# Patient Record
Sex: Female | Born: 1995 | Race: White | Hispanic: No | Marital: Single | Smoking: Former smoker
Health system: Southern US, Community
[De-identification: ages and names within clinical notes are randomized; demographics above are authoritative.]

## PROBLEM LIST (undated history)

## (undated) ENCOUNTER — Inpatient Hospital Stay: Payer: Self-pay

## (undated) DIAGNOSIS — R109 Unspecified abdominal pain: Secondary | ICD-10-CM

## (undated) DIAGNOSIS — K8020 Calculus of gallbladder without cholecystitis without obstruction: Secondary | ICD-10-CM

## (undated) DIAGNOSIS — F419 Anxiety disorder, unspecified: Secondary | ICD-10-CM

## (undated) DIAGNOSIS — R634 Abnormal weight loss: Secondary | ICD-10-CM

## (undated) DIAGNOSIS — N390 Urinary tract infection, site not specified: Secondary | ICD-10-CM

## (undated) DIAGNOSIS — M779 Enthesopathy, unspecified: Secondary | ICD-10-CM

## (undated) DIAGNOSIS — J45909 Unspecified asthma, uncomplicated: Secondary | ICD-10-CM

## (undated) DIAGNOSIS — F900 Attention-deficit hyperactivity disorder, predominantly inattentive type: Secondary | ICD-10-CM

## (undated) HISTORY — DX: Unspecified abdominal pain: R10.9

## (undated) HISTORY — DX: Abnormal weight loss: R63.4

## (undated) HISTORY — DX: Attention-deficit hyperactivity disorder, predominantly inattentive type: F90.0

## (undated) HISTORY — PX: ADENOIDECTOMY: SUR15

## (undated) SURGERY — LAPAROSCOPIC CHOLECYSTECTOMY
Anesthesia: General

---
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
## 2015-01-01 NOTE — BH Specialist Note (Signed)
Referring Provider: Victorino Dike, FNP PCP: Tresa Res, MD Session Time:  10:36 - 11:00 (24 min) Type of Service: [place] Interpreter: No.  Interpreter Name & Language: NA   PRESENTING CONCERNS:  Hannah Hancock is a 19 y.o. female brought in by patient. Hannah Hancock was referred to [facility] for mood and forgetfulness.   GOALS ADDRESSED:  Enhance positive coping skills Increase patient's self-awareness, ability to modulate moods and interact with others in a more pro-social manner Develop healthy interpersonal relationships that lead to alleviation and help prevent the relapse of depression symptoms    INTERVENTIONS:  Assessed current condition/needs Built rapport Discussed integrated care Provided psychoeducation Stress managment    ASSESSMENT/OUTCOME:  Hannah Hancock is quiet and sleepy today. It is difficult to assess current needs as she often answered "I don't know." She contradicted herself at times about her needs. She was looking forward to talking to provider about medication options. Encouraged patient to keep trying as she has tried a few other medications without relief (she admits that she does not take them regularly). Today she reports stopping all medication cold-turkey. Cautioned Hannah Hancock about this and tried to give education, she was minimally receptive.   Current stress includes roommate and differences between both women. Assessed communication. Hannah Hancock admits that she does not attempt to communicate since she is worried about roommate's reaction. Challenged this thought with minimal success. Offered additional communication strategies, Hannah Hancock not interested today.  Hannah Hancock stated her history in counseling and unwillingness to start again. Validated her feelings, attempted complex reflections to create ambivalence. Also normalized the experience of change. We practiced deep breathing and Hannah Hancock did repotr liking this and  getting more energy.  Hannah Hancock denied SI today.   PLAN:  Hannah Hancock will continue to follow medical advice and take medications as prescribed. She will consider how she communicated with roommate and consider changes to help get her needs met. Do something you enjoy to celebrate the end of the semester! She's in agreement.  Scheduled next visit: None at this time.  [facility]

## 2015-01-15 ENCOUNTER — Ambulatory Visit (INDEPENDENT_AMBULATORY_CARE_PROVIDER_SITE_OTHER): Payer: BLUE CROSS/BLUE SHIELD | Admitting: Pediatrics

## 2015-01-15 ENCOUNTER — Encounter: Payer: Self-pay | Admitting: Pediatrics

## 2015-01-15 VITALS — BP 118/72 | HR 92 | Ht 63.0 in | Wt 148.6 lb

## 2015-01-15 DIAGNOSIS — F4323 Adjustment disorder with mixed anxiety and depressed mood: Secondary | ICD-10-CM

## 2015-01-15 DIAGNOSIS — G479 Sleep disorder, unspecified: Secondary | ICD-10-CM

## 2015-01-15 MED ORDER — BUPROPION HCL ER (XL) 300 MG PO TB24: 300.0000 mg | ORAL_TABLET | Freq: Every day | ORAL | Status: DC

## 2015-01-15 NOTE — Progress Notes (Signed)
Adolescent Medicine Consultation Follow-Up Visit Hannah Hancock Hannah Hancock  is a 19 y.o. female referred by Hannah Hancock,ROSEMARY, MD here today for follow-up of adjustment disorder, new medication.   Previsit planning completed:  no  Growth Chart Viewed? yes  PCP Confirmed?  yes   History was provided by the patient.  HPI:  She doesn't feel particularly different taking wellbutrin. She has missed a few days but is overall taking it most of the time. Feels like tiredness is the same but is not more sleepy. Insomnia has not worsened. She had not had any headaches or dizziness but has had some stomach pains. They aren't particularly bothering her. She continues to work about 5 days a week. She is living at her boyfriend's apartment which has been better than living at home was.   Had some burning with urination on Monday after sex but this has improved. She took some Azo with good relief. She has had many UTIs in the past but feels like this has resolved on its own.   No LMP recorded. Patient has had an implant.  The following portions of the patient's history were reviewed and updated as appropriate: allergies, current medications, past family history, past medical history, past social history and problem list.  No Known Allergies   Review of Systems  Constitutional: Negative for weight loss and malaise/fatigue.  Eyes: Negative for blurred vision.  Respiratory: Negative for shortness of breath.   Cardiovascular: Negative for chest pain and palpitations.  Gastrointestinal: Negative for nausea, vomiting, abdominal pain and constipation.  Genitourinary: Negative for dysuria.  Musculoskeletal: Negative for myalgias.  Neurological: Negative for dizziness and headaches.  Psychiatric/Behavioral: Positive for depression. Negative for suicidal ideas.     Social History: Sleep: going to bed late. Still with poor sleep hygiene.  Eating Habits: eats one main meal a day with other snacks  Exercise:  None School: [employer] in the fall  Future Plans: RN   Physical Exam:  Filed Vitals:   01/15/15 0833  BP: 118/72  Pulse: 92  Height: 5\' 3"  (1.6 m)  Weight: 148 lb 9.6 oz (67.405 kg)   BP 118/72 mmHg  Pulse 92  Ht 5\' 3"  (1.6 m)  Wt 148 lb 9.6 oz (67.405 kg)  BMI 26.33 kg/m2 Body mass index: body mass index is 26.33 kg/(m^2). Blood pressure percentiles are 80% systolic and 77% diastolic based on 2000 NHANES data. Blood pressure percentile targets: 90: 123/78, 95: 126/82, 99 + 5 mmHg: 139/94.  Physical Exam  Constitutional: She is oriented to person, place, and time. She appears well-developed and well-nourished.  HENT:  Head: Normocephalic.  Neck: No thyromegaly present.  Cardiovascular: Normal rate, regular rhythm, normal heart sounds and intact distal pulses.   Pulmonary/Chest: Effort normal and breath sounds normal.  Abdominal: Soft. Bowel sounds are normal. There is no tenderness.  Musculoskeletal: Normal range of motion.  Neurological: She is alert and oriented to person, place, and time.  Skin: Skin is warm and dry.  Psychiatric:  Flat affect     Assessment/Plan: 1. Adjustment disorder with mixed anxiety and depressed mood Increase Wellbutrin XL to 300 mg as minimal side effects but also very minimal effect in the past 2 weeks. She will continue to work on consistency of taking it. She is still not interested in seeing any type of therapist. Denies thoughts of self harm today. Has tried zoloft, cymbalta and remeron in the past without good effect or too many side effects.   2. Sleep disturbance Continue to work  on sleep hygiene. Many different things tried for sleep but all have left her feeling more tired the next day including remeron, cymbalta and hydroxyzine. Could consider addition of trazodone in the future.    Follow-up:  1 month   Medical decision-making:  > 15 minutes spent, more than 50% of appointment was spent discussing diagnosis and management of  symptoms

## 2015-01-15 NOTE — Patient Instructions (Addendum)
We increased your medication to 300 mg daily. Keep taking it in the morning. Make sure you are remembering to take it every day as you will get the most benefit out of being consistent with it. You may take 2 of the 150 mg pills until you run out and then pick up your new prescription.   Please let us know if you need something before 1 month!

## 2015-02-16 ENCOUNTER — Ambulatory Visit: Payer: BLUE CROSS/BLUE SHIELD | Admitting: Pediatrics

## 2015-02-24 ENCOUNTER — Encounter (INDEPENDENT_AMBULATORY_CARE_PROVIDER_SITE_OTHER): Payer: Self-pay

## 2015-02-24 ENCOUNTER — Encounter: Payer: Self-pay | Admitting: Pediatrics

## 2015-02-24 ENCOUNTER — Ambulatory Visit (INDEPENDENT_AMBULATORY_CARE_PROVIDER_SITE_OTHER): Payer: BLUE CROSS/BLUE SHIELD | Admitting: Pediatrics

## 2015-02-24 VITALS — BP 119/77 | HR 91 | Ht 63.0 in | Wt 149.8 lb

## 2015-02-24 DIAGNOSIS — F4323 Adjustment disorder with mixed anxiety and depressed mood: Secondary | ICD-10-CM

## 2015-02-24 DIAGNOSIS — G479 Sleep disorder, unspecified: Secondary | ICD-10-CM

## 2015-02-24 DIAGNOSIS — Z113 Encounter for screening for infections with a predominantly sexual mode of transmission: Secondary | ICD-10-CM

## 2015-02-24 DIAGNOSIS — M25551 Pain in right hip: Secondary | ICD-10-CM | POA: Diagnosis not present

## 2015-02-24 DIAGNOSIS — Z975 Presence of (intrauterine) contraceptive device: Secondary | ICD-10-CM | POA: Diagnosis not present

## 2015-02-24 DIAGNOSIS — N921 Excessive and frequent menstruation with irregular cycle: Secondary | ICD-10-CM | POA: Diagnosis not present

## 2015-02-24 MED ORDER — SERTRALINE HCL 25 MG PO TABS: 25.0000 mg | ORAL_TABLET | Freq: Every day | ORAL | Status: DC

## 2015-02-24 NOTE — Patient Instructions (Addendum)
Get established with a primary care doctor-- maybe where your mom goes.   Take the anti-inflammatory medication every day for the next 2 weeks and see if it helps with your hip pain. The primary care doctor can better help assess your hip pain and refer your to sports medicine if needed.  Take 1 tablet of Zoloft once a day. Once we get it established we may be able to increase it over time if need be. We will see you again in a month.   We have checked for infections today to make sure they aren't causing your bleeding. If there are no infections there are a few different options we have to help make the bleeding better during the times you are cycling.

## 2015-02-24 NOTE — Progress Notes (Addendum)
Adolescent Medicine Consultation Follow-Up Visit Hannah Hancock  is a 19 y.o. female referred by Clayborne Dana, MD here today for follow-up of adjustment disorder, insomnia, nexplanon.   Previsit planning completed:  Yes  Completed on: 11/10/2014 PHQ-15: 6 (previously 6) GAD-7:6 (previously 9) PHQ-9:11 (previously 6) Reported problems make it somewhat difficult to complete activities of daily functioning.  Growth Chart Viewed? yes  PCP Confirmed?  Does not have PCP   History was provided by the patient.  HPI:  Patient stopped taking medication because she felt like it was keeping her up all night even if she took it early. Didn't feel like it was helping her at all. She gave it a few weeks try. Would like to start back on Zoloft.   Now that she has stopped it she is still not falling asleep till 4 am. She sleeps till noon if she doesn't have anywhere to be. She is used to this late schedule.   She has been having periods the past 2 months. They are heavier than before. Has had some cramping as well. Using 4-5 pads/tampons in a day. Lasting about 1 week.   Staying at home. Friend is still there. They are moving soon so they will each have their own rooms which will be better.   PHQ-SADS Completed on: 02/24/2015  PHQ-15:  8 GAD-7:  7 PHQ-9:  8 Reported problems make it somewhat difficult to complete activities of daily functioning.   Patient's last menstrual period was 02/15/2015.  The following portions of the patient's history were reviewed and updated as appropriate: allergies, current medications, past family history, past medical history, past social history and problem list.  No Known Allergies   Review of Systems  Constitutional: Negative for weight loss and malaise/fatigue.  Eyes: Negative for blurred vision.  Respiratory: Negative for shortness of breath.   Cardiovascular: Negative for chest pain and palpitations.  Gastrointestinal: Negative for nausea,  vomiting, abdominal pain and constipation.  Genitourinary: Negative for dysuria.  Musculoskeletal: Positive for joint pain. Negative for myalgias.  Neurological: Negative for dizziness and headaches.  Psychiatric/Behavioral: Negative for depression.     Social History: Sleep: As above  Eating Habits: Eating fairly well Exercise: None School: Starts in August    Physical Exam:  Filed Vitals:   02/24/15 1124  BP: 119/77  Pulse: 91  Height:  (1.6 m)  Weight: 149 lb 12.8 oz (67.949 kg)   BP 119/77 mmHg  Pulse 91  Ht  (1.6 m)  Wt 149 lb 12.8 oz (67.949 kg)  BMI 26.54 kg/m2  LMP 02/15/2015 Body mass index: body mass index is 26.54 kg/(m^2). Blood pressure percentiles are 83% systolic and 89% diastolic based on 2000 NHANES data. Blood pressure percentile targets: 90: 122/78, 95: 126/82, 99 + 5 mmHg: 138/94.  Physical Exam  Constitutional: She is oriented to person, place, and time. She appears well-developed and well-nourished.  HENT:  Head: Normocephalic.  Neck: No thyromegaly present.  Cardiovascular: Normal rate, regular rhythm, normal heart sounds and intact distal pulses.   Pulmonary/Chest: Effort normal and breath sounds normal.  Abdominal: Soft. Bowel sounds are normal. There is no tenderness.  Musculoskeletal: Normal range of motion.  Neurological: She is alert and oriented to person, place, and time.  Skin: Skin is warm and dry.  Psychiatric: She has a normal mood and affect.    Assessment/Plan: 1. Adjustment disorder with mixed anxiety and depressed mood Patient would like to go back to Zoloft and start slow and go  up. She felt like this was the most effective. Will increase in the future if need be.  - sertraline (ZOLOFT) 25 MG tablet; Take 1 tablet (25 mg total) by mouth daily.  Dispense: 30 tablet; Refill: 0  2. Sleep disturbance Continue to work on sleep hygiene.   3. Presence of subdermal contraceptive device In place in LUE. Almost 1 year  since placement.   4. Routine screening for STI (sexually transmitted infection) Will screen for STI in setting of new bleeding with nexplanon.  - GC/chlamydia probe amp, urine  5. Breakthrough bleeding on Nexplanon Screen for all infections in the setting of new bleeding with her nexplanon. If not infectious can consider addition of NSAID or OCP to help with heavy bleeding during the cycles she is having.  - WET PREP BY MOLECULAR PROBE  6. Hip pain, right Continue voltaren EC tablet. Establish with PCP for further workup and possible sports med referral.    Follow-up:  1 month   Medical decision-making:  > 25 minutes spent, more than 50% of appointment was spent discussing diagnosis and management of symptoms

## 2015-02-25 ENCOUNTER — Telehealth: Payer: Self-pay | Admitting: *Deleted

## 2015-02-25 LAB — GC/CHLAMYDIA PROBE AMP, URINE
Chlamydia, Swab/Urine, PCR: NEGATIVE
GC Probe Amp, Urine: NEGATIVE

## 2015-02-25 LAB — WET PREP BY MOLECULAR PROBE
Candida species: NEGATIVE
Gardnerella vaginalis: NEGATIVE
TRICHOMONAS VAG: NEGATIVE

## 2015-02-25 NOTE — Telephone Encounter (Signed)
TC to pt. No infections. Advised we will continue to follow her bleeding for her nexplanon. If she has concerns about it before her next appointment she can call clinic. Reminded of f/u appt.

## 2015-02-25 NOTE — Telephone Encounter (Signed)
-----   Message from Verneda Skillaroline T Hacker, FNP sent at 02/25/2015 10:40 AM EDT ----- No infections. We will continue to follow her bleeding for her nexplanon. If she has concerns about it before her next appointment she can call clinic. It is normal to have monthly periods, however, if she is concerned about how heavy it is we can talk about further treatment.

## 2015-03-24 ENCOUNTER — Emergency Department: Payer: BLUE CROSS/BLUE SHIELD

## 2015-03-24 ENCOUNTER — Encounter: Payer: Self-pay | Admitting: Emergency Medicine

## 2015-03-24 ENCOUNTER — Emergency Department
Admission: EM | Admit: 2015-03-24 | Discharge: 2015-03-24 | Disposition: A | Discharge status: Home / Self Care | Payer: BLUE CROSS/BLUE SHIELD | Attending: Emergency Medicine | Admitting: Emergency Medicine

## 2015-03-24 DIAGNOSIS — N390 Urinary tract infection, site not specified: Secondary | ICD-10-CM

## 2015-03-24 DIAGNOSIS — Z720 Tobacco use: Secondary | ICD-10-CM | POA: Diagnosis not present

## 2015-03-24 DIAGNOSIS — Z792 Long term (current) use of antibiotics: Secondary | ICD-10-CM | POA: Diagnosis not present

## 2015-03-24 DIAGNOSIS — Z3202 Encounter for pregnancy test, result negative: Secondary | ICD-10-CM | POA: Diagnosis not present

## 2015-03-24 DIAGNOSIS — R112 Nausea with vomiting, unspecified: Secondary | ICD-10-CM | POA: Diagnosis present

## 2015-03-24 DIAGNOSIS — R52 Pain, unspecified: Secondary | ICD-10-CM

## 2015-03-24 DIAGNOSIS — Z791 Long term (current) use of non-steroidal anti-inflammatories (NSAID): Secondary | ICD-10-CM | POA: Diagnosis not present

## 2015-03-24 DIAGNOSIS — R748 Abnormal levels of other serum enzymes: Secondary | ICD-10-CM | POA: Diagnosis not present

## 2015-03-24 DIAGNOSIS — R110 Nausea: Secondary | ICD-10-CM

## 2015-03-24 HISTORY — DX: Enthesopathy, unspecified: M77.9

## 2015-03-24 LAB — COMPREHENSIVE METABOLIC PANEL
ALK PHOS: 238 U/L — AB (ref 38–126)
ALT: 160 U/L — ABNORMAL HIGH (ref 14–54)
ANION GAP: 11 (ref 5–15)
AST: 139 U/L — AB (ref 15–41)
Albumin: 4 g/dL (ref 3.5–5.0)
BILIRUBIN TOTAL: 10.1 mg/dL — AB (ref 0.3–1.2)
BUN: 11 mg/dL (ref 6–20)
CO2: 23 mmol/L (ref 22–32)
Calcium: 9.3 mg/dL (ref 8.9–10.3)
Chloride: 103 mmol/L (ref 101–111)
Creatinine, Ser: 0.32 mg/dL — ABNORMAL LOW (ref 0.44–1.00)
GFR calc Af Amer: >60 mL/min (ref >60)
GLUCOSE: 92 mg/dL (ref 65–99)
Potassium: 3.6 mmol/L (ref 3.5–5.1)
SODIUM: 137 mmol/L (ref 135–145)
TOTAL PROTEIN: 7.6 g/dL (ref 6.5–8.1)

## 2015-03-24 LAB — CBC
HEMATOCRIT: 35.9 % (ref 35.0–47.0)
Hemoglobin: 12.5 g/dL (ref 12.0–16.0)
MCH: 29.6 pg (ref 26.0–34.0)
MCHC: 34.8 g/dL (ref 32.0–36.0)
MCV: 85.1 fL (ref 80.0–100.0)
PLATELETS: 126 10*3/uL — AB (ref 150–440)
RBC: 4.22 MIL/uL (ref 3.80–5.20)
RDW: 13.6 % (ref 11.5–14.5)
WBC: 6.5 10*3/uL (ref 3.6–11.0)

## 2015-03-24 LAB — URINALYSIS COMPLETE WITH MICROSCOPIC (ARMC ONLY)
GLUCOSE, UA: NEGATIVE mg/dL
NITRITE: NEGATIVE
Protein, ur: 30 mg/dL — AB
Specific Gravity, Urine: 1.027 (ref 1.005–1.030)
pH: 5 (ref 5.0–8.0)

## 2015-03-24 LAB — PREGNANCY, URINE: PREG TEST UR: NEGATIVE

## 2015-03-24 LAB — LIPASE, BLOOD: Lipase: 36 U/L (ref 22–51)

## 2015-03-24 MED ORDER — CEPHALEXIN 500 MG PO CAPS: 500.0000 mg | ORAL_CAPSULE | Freq: Four times a day (QID) | ORAL | Status: DC

## 2015-03-24 MED ORDER — DEXTROSE 5 % IV SOLN
1.0000 g | Freq: Once | INTRAVENOUS | Status: AC
  Administered 2015-03-24: 1 g via INTRAVENOUS
  Filled 2015-03-24: qty 10

## 2015-03-24 MED ORDER — SODIUM CHLORIDE 0.9 % IV BOLUS (SEPSIS)
1000.0000 mL | Freq: Once | INTRAVENOUS | Status: AC
  Administered 2015-03-24: 1000 mL via INTRAVENOUS

## 2015-03-24 MED ORDER — ONDANSETRON HCL 4 MG PO TABS: 4.0000 mg | ORAL_TABLET | Freq: Every day | ORAL | Status: AC | PRN

## 2015-03-24 NOTE — ED Notes (Signed)
To [place] via wheelchair.  AAOx3.  Skin warm and dry.

## 2015-03-24 NOTE — ED Notes (Signed)
Return from [place].  AAOx3.  Skin warm and dry. NAD 

## 2015-03-24 NOTE — ED Notes (Signed)
Pt reports nausea starting about a week ago with vomiting yesterday.  Pt also reports hematuria starting 2 days, but denies pain on urination.  Pt reports feeling like she was going to pass out yesterday.

## 2015-03-24 NOTE — Discharge Instructions (Signed)
° ° ° °  CALL DR. Bluford Kaufmann FOR AN APPOINTMENT TO DISCUSS AND DO MORE TESTS CONCERNING YOUR ELEVATED LIVER STUDIES FROM TODAY.  MORE TESTS WILL NEED TO BE DONE INCREASE FLUIDS TAKE KEFLEX FOR INFECTION AND ZOFRAN AS NEEDED FOR NAUSEA OR VOMITING DO NOT TAKE ANY PRODUCTS WITH TYLENOL IN IT UNTIL SEEN BY DR. Bluford Kaufmann

## 2015-03-24 NOTE — ED Provider Notes (Signed)
[facility] Emergency Department Provider Note  ____________________________________________  Time seen:  2:13 PM  I have reviewed the triage vital signs and the nursing notes.   HISTORY  Chief Complaint Hematuria and Nausea   HPI Hannah Hancock is a 19 y.o. female is here today with complaint of nausea for approximately one week. She states that she has some vomiting yesterday and was able to take some medication was given to her during the when her months when she was sick. This helped with the nausea great deal. She is also continued to feel weak at work and had a near syncopal episode yesterday. She complains of some slight abdominal cramping. She reports hematuria starting 2 days ago but denies dysuria. She is unaware of any fever. There has been some nausea after eating especially places like cookout. Patient initially was over at [facility] clinic and was brought over for evaluation.No other family members are sick at this time. Patient also states that she has not felt like eating for the last 2 days but was able to eat some eggs today.   Past Medical History  Diagnosis Date  . Abdominal pain, recurrent   . Weight loss   . Urinary tract infection     Last one 4 months ago  . Varicella   . 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 Tendinitis     R hip    Patient Active Problem List   Diagnosis Date Noted  . 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 Presence of subdermal contraceptive device 04/07/2014  . Sleep disturbance 11/03/2013  . Adjustment disorder with mixed anxiety and depressed mood 08/12/2013    Past Surgical History  Procedure Laterality Date  . Adenoidectomy    . Esophagogastroduodenoscopy  10/20/2011    Procedure: ESOPHAGOGASTRODUODENOSCOPY (EGD);  Surgeon: Jon Gills, MD;  Location: [facility] OR;  Service: Gastroenterology;  Laterality: N/A;    Current Outpatient Rx  Name  Route  Sig  Dispense  Refill  .  cephALEXin (KEFLEX) 500 MG capsule   Oral   Take 1 capsule (500 mg total) by mouth 4 (four) times daily.   40 capsule   0   . clindamycin-benzoyl peroxide (BENZACLIN) gel               . diclofenac (VOLTAREN) 50 MG EC tablet   Oral   Take 1 tablet (50 mg total) by mouth 2 (two) times daily.   60 tablet   3   . EPIDUO 0.1-2.5 % gel               . Multiple Vitamin (MULTIVITAMIN) tablet   Oral   Take 1 tablet by mouth daily.         . ondansetron (ZOFRAN) 4 MG tablet   Oral   Take 1 tablet (4 mg total) by mouth daily as needed for nausea or vomiting.   30 tablet   1   . sertraline (ZOLOFT) 25 MG tablet   Oral   Take 1 tablet (25 mg total) by mouth daily.   30 tablet   0     Allergies Review of patient's allergies indicates no known allergies.  Family History  Problem Relation Age of Onset  . Cholelithiasis Mother   . Miscarriages / [place] Mother   . Hypertension Maternal Grandmother   . Kidney disease Maternal Grandmother   . Stroke Maternal Grandmother   . Diabetes Paternal Grandfather   . Hypertension Paternal Grandfather  Social History History  Substance Use Topics  . Smoking status: Current Every Day Smoker -- 0.30 packs/day for 1 years    Types: Cigarettes  . Smokeless tobacco: Never Used  . Alcohol Use: No    Review of Systems Constitutional: No fever/chills Eyes: No visual changes. ENT: No sore throat. Cardiovascular: Denies chest pain. Respiratory: Denies shortness of breath. Gastrointestinal: No abdominal pain.  No nausea, no vomiting.  No diarrhea. Genitourinary: Positive hematuria Musculoskeletal: Negative for back pain. Skin: Negative for rash. Neurological: Negative for headaches, focal weakness or numbness.  10-point ROS otherwise negative.  ____________________________________________   PHYSICAL EXAM:  VITAL SIGNS: ED Triage Vitals  Enc Vitals Group     BP 03/24/15 1246 114/69 mmHg     Pulse Rate 03/24/15  1246 98     Resp 03/24/15 1246 20     Temp 03/24/15 1246 98.6 F (37 C)     Temp Source 03/24/15 1246 Oral     SpO2 03/24/15 1246 98 %     Weight 03/24/15 1246 140 lb (63.504 kg)     Height 03/24/15 1246  (1.6 m)     Head Cir --      Peak Flow --      Pain Score --      Pain Loc --      Pain Edu? --      Excl. in GC? --     Constitutional: Alert and oriented. Well appearing and in no acute distress. Eyes: Conjunctivae are normal to slightly questionable icterus. PERRL. EOMI. Head: Atraumatic. Nose: No congestion/rhinnorhea. Mouth/Throat: Mucous membranes are moist.  Oropharynx non-erythematous. Neck: No stridor.   Hematological/Lymphatic/Immunilogical: No cervical lymphadenopathy. Cardiovascular: Normal rate, regular rhythm. Grossly normal heart sounds.  Good peripheral circulation. Respiratory: Normal respiratory effort.  No retractions. Lungs CTAB. Gastrointestinal: Soft. Mild suprapubic tenderness. No distention. No abdominal bruits. No CVA tenderness. Musculoskeletal: No lower extremity tenderness nor edema.  No joint effusions. Neurologic:  Normal speech and language. No gross focal neurologic deficits are appreciated. No gait instability. Skin:  Skin is warm, dry and intact. No rash noted. Psychiatric: Mood and affect are normal. Speech and behavior are normal.  ____________________________________________   LABS (all labs ordered are listed, but only abnormal results are displayed)  Labs Reviewed  COMPREHENSIVE METABOLIC PANEL - Abnormal; Notable for the following:    Creatinine, Ser 0.32 (*)    AST 139 (*)    ALT 160 (*)    Alkaline Phosphatase 238 (*)    Total Bilirubin 10.1 (*)    All other components within normal limits  CBC - Abnormal; Notable for the following:    Platelets 126 (*)    All other components within normal limits  URINALYSIS COMPLETEWITH MICROSCOPIC ([facility] ONLY) - Abnormal; Notable for the following:    Color, Urine AMBER (*)     APPearance CLEAR (*)    Bilirubin Urine 2+ (*)    Ketones, ur 1+ (*)    Hgb urine dipstick 3+ (*)    Protein, ur 30 (*)    Leukocytes, UA 1+ (*)    Bacteria, UA FEW (*)    Squamous Epithelial / LPF 6-30 (*)    All other components within normal limits  LIPASE, BLOOD  PREGNANCY, URINE  HEPATITIS PANEL, ACUTE  POC URINE PREG, ED   RADIOLOGY   Ultrasound abdomen limited right upper quadrant shows normal gallbladder thickness per radiologist normal bile duct and normal appearance of the liver. ____________________________________________   PROCEDURES  Procedure(s) performed: None  Critical Care performed: No  ____________________________________________   INITIAL IMPRESSION / ASSESSMENT AND PLAN / ED COURSE  Pertinent labs & imaging results that were available during my care of the patient were reviewed by me and considered in my medical decision making (see chart for details).  Discussed with patient and family member elevated liver enzymes. Patient states that she does not drink alcohol, does not smoke, does not do any IV drugs or drug use of any kind. Patient and family member is aware that she needs to follow-up with Dr. Bluford Kaufmann.  Hepatitis panel was sent and is still pending. Patient was placed on Keflex for urinary tract infection. She is also given a prescription for Zofran if needed for nausea. She is return to the emergency room if any severe worsening urgent concerns. ____________________________________________   FINAL CLINICAL IMPRESSION(S) / ED DIAGNOSES  Final diagnoses:  Nausea  Pain  Acute urinary tract infection  Elevated liver enzymes      Tommi Rumps, PA-C 03/24/15 1726  Arnaldo Natal, MD 03/25/15 1221

## 2015-03-24 NOTE — ED Notes (Signed)
Pt brought over by [facility] for N/V, reports "almost passed out at work yesterday".

## 2015-03-24 NOTE — ED Notes (Signed)
States she noticed some blood in her urine yesterday and today . Also felt weak at work and had near syncopal episode. conts to feel same with slight abd cramping

## 2015-03-25 LAB — HEPATITIS PANEL, ACUTE
Hep A IgM: NEGATIVE
Hep B C IgM: NEGATIVE
Hepatitis B Surface Ag: NEGATIVE

## 2015-03-26 ENCOUNTER — Other Ambulatory Visit: Payer: Self-pay | Admitting: Physician Assistant

## 2015-03-26 DIAGNOSIS — R7989 Other specified abnormal findings of blood chemistry: Secondary | ICD-10-CM

## 2015-03-26 DIAGNOSIS — R945 Abnormal results of liver function studies: Secondary | ICD-10-CM

## 2015-03-26 DIAGNOSIS — R112 Nausea with vomiting, unspecified: Secondary | ICD-10-CM

## 2015-03-26 DIAGNOSIS — R17 Unspecified jaundice: Secondary | ICD-10-CM

## 2015-03-29 ENCOUNTER — Ambulatory Visit: Payer: BLUE CROSS/BLUE SHIELD | Admitting: Pediatrics

## 2015-03-30 ENCOUNTER — Ambulatory Visit
Admission: RE | Admit: 2015-03-30 | Discharge: 2015-03-30 | Disposition: A | Discharge status: Home / Self Care | Payer: BLUE CROSS/BLUE SHIELD | Source: Ambulatory Visit | Attending: Physician Assistant | Admitting: Physician Assistant

## 2015-03-30 DIAGNOSIS — R945 Abnormal results of liver function studies: Secondary | ICD-10-CM

## 2015-03-30 DIAGNOSIS — R112 Nausea with vomiting, unspecified: Secondary | ICD-10-CM | POA: Diagnosis present

## 2015-03-30 DIAGNOSIS — R7989 Other specified abnormal findings of blood chemistry: Secondary | ICD-10-CM | POA: Insufficient documentation

## 2015-03-30 DIAGNOSIS — R17 Unspecified jaundice: Secondary | ICD-10-CM | POA: Diagnosis present

## 2015-03-30 HISTORY — DX: Unspecified asthma, uncomplicated: J45.909

## 2015-03-30 MED ORDER — IOHEXOL 300 MG/ML  SOLN
100.0000 mL | Freq: Once | INTRAMUSCULAR | Status: AC | PRN
  Administered 2015-03-30: 100 mL via INTRAVENOUS

## 2015-03-31 ENCOUNTER — Ambulatory Visit: Payer: BLUE CROSS/BLUE SHIELD

## 2015-05-03 DIAGNOSIS — F32A Depression, unspecified: Secondary | ICD-10-CM | POA: Insufficient documentation

## 2015-08-10 ENCOUNTER — Other Ambulatory Visit: Payer: Self-pay | Admitting: Internal Medicine

## 2015-08-10 DIAGNOSIS — M25551 Pain in right hip: Principal | ICD-10-CM

## 2015-08-10 DIAGNOSIS — G8929 Other chronic pain: Secondary | ICD-10-CM

## 2015-08-31 ENCOUNTER — Ambulatory Visit
Admission: RE | Admit: 2015-08-31 | Discharge: 2015-08-31 | Disposition: A | Discharge status: Home / Self Care | Payer: BLUE CROSS/BLUE SHIELD | Source: Ambulatory Visit | Attending: Internal Medicine | Admitting: Internal Medicine

## 2015-08-31 DIAGNOSIS — M25551 Pain in right hip: Secondary | ICD-10-CM | POA: Insufficient documentation

## 2015-08-31 DIAGNOSIS — G8929 Other chronic pain: Secondary | ICD-10-CM | POA: Diagnosis present

## 2016-03-09 DIAGNOSIS — E559 Vitamin D deficiency, unspecified: Secondary | ICD-10-CM | POA: Insufficient documentation

## 2016-03-15 ENCOUNTER — Encounter: Payer: Self-pay | Admitting: Pediatrics

## 2016-03-16 ENCOUNTER — Encounter: Payer: Self-pay | Admitting: Pediatrics

## 2016-12-24 ENCOUNTER — Encounter: Payer: Self-pay | Admitting: Emergency Medicine

## 2016-12-24 ENCOUNTER — Emergency Department
Admission: EM | Admit: 2016-12-24 | Discharge: 2016-12-24 | Disposition: A | Discharge status: Home / Self Care | Payer: BLUE CROSS/BLUE SHIELD | Attending: Emergency Medicine | Admitting: Emergency Medicine

## 2016-12-24 DIAGNOSIS — M791 Myalgia: Secondary | ICD-10-CM | POA: Diagnosis not present

## 2016-12-24 DIAGNOSIS — Y999 Unspecified external cause status: Secondary | ICD-10-CM | POA: Insufficient documentation

## 2016-12-24 DIAGNOSIS — F17210 Nicotine dependence, cigarettes, uncomplicated: Secondary | ICD-10-CM | POA: Insufficient documentation

## 2016-12-24 DIAGNOSIS — Y939 Activity, unspecified: Secondary | ICD-10-CM | POA: Insufficient documentation

## 2016-12-24 DIAGNOSIS — M7918 Myalgia, other site: Secondary | ICD-10-CM

## 2016-12-24 DIAGNOSIS — S0990XA Unspecified injury of head, initial encounter: Secondary | ICD-10-CM | POA: Diagnosis present

## 2016-12-24 DIAGNOSIS — F900 Attention-deficit hyperactivity disorder, predominantly inattentive type: Secondary | ICD-10-CM | POA: Insufficient documentation

## 2016-12-24 DIAGNOSIS — Y92410 Unspecified street and highway as the place of occurrence of the external cause: Secondary | ICD-10-CM | POA: Diagnosis not present

## 2016-12-24 DIAGNOSIS — J45909 Unspecified asthma, uncomplicated: Secondary | ICD-10-CM | POA: Diagnosis not present

## 2016-12-24 MED ORDER — CYCLOBENZAPRINE HCL 10 MG PO TABS: 10.0000 mg | ORAL_TABLET | Freq: Three times a day (TID) | ORAL | 0 refills | Status: DC | PRN
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
## 2017-02-05 ENCOUNTER — Telehealth: Payer: Self-pay | Admitting: Obstetrics and Gynecology

## 2017-02-05 NOTE — Telephone Encounter (Signed)
Patient called to make a New patient appointment 02/05/2017. The computer system was down and I was not able to pull up her information, I informed the patient that I would call back when Epic came back up. I was not able to lvm when I called. Thank you.

## 2017-02-13 ENCOUNTER — Telehealth: Payer: Self-pay | Admitting: Obstetrics and Gynecology

## 2017-02-13 NOTE — Telephone Encounter (Signed)
Patient called wanting to speak with a nurse or get something to help, Patient is Vomiting and having severe Nausea, and would like to be contacting to get something to help. Please advise.

## 2017-02-14 ENCOUNTER — Encounter: Payer: Self-pay | Admitting: *Deleted

## 2017-02-14 ENCOUNTER — Emergency Department
Admission: EM | Admit: 2017-02-14 | Discharge: 2017-02-14 | Disposition: A | Discharge status: Home / Self Care | Payer: BLUE CROSS/BLUE SHIELD | Attending: Emergency Medicine | Admitting: Emergency Medicine

## 2017-02-14 DIAGNOSIS — O219 Vomiting of pregnancy, unspecified: Secondary | ICD-10-CM | POA: Diagnosis present

## 2017-02-14 DIAGNOSIS — F17210 Nicotine dependence, cigarettes, uncomplicated: Secondary | ICD-10-CM | POA: Insufficient documentation

## 2017-02-14 DIAGNOSIS — F900 Attention-deficit hyperactivity disorder, predominantly inattentive type: Secondary | ICD-10-CM | POA: Diagnosis not present

## 2017-02-14 DIAGNOSIS — J45909 Unspecified asthma, uncomplicated: Secondary | ICD-10-CM | POA: Diagnosis not present

## 2017-02-14 DIAGNOSIS — Z3A01 Less than 8 weeks gestation of pregnancy: Secondary | ICD-10-CM | POA: Insufficient documentation

## 2017-02-14 DIAGNOSIS — Z79899 Other long term (current) drug therapy: Secondary | ICD-10-CM | POA: Diagnosis not present

## 2017-02-14 DIAGNOSIS — O210 Mild hyperemesis gravidarum: Secondary | ICD-10-CM

## 2017-02-14 LAB — URINALYSIS, COMPLETE (UACMP) WITH MICROSCOPIC
Bacteria, UA: NONE SEEN
Bilirubin Urine: NEGATIVE
Glucose, UA: NEGATIVE mg/dL
Ketones, ur: 5 mg/dL — AB
Nitrite: NEGATIVE
PH: 6 (ref 5.0–8.0)
Protein, ur: NEGATIVE mg/dL
SPECIFIC GRAVITY, URINE: 1.013 (ref 1.005–1.030)

## 2017-02-14 LAB — COMPREHENSIVE METABOLIC PANEL
ALK PHOS: 66 U/L (ref 38–126)
ALT: 13 U/L — AB (ref 14–54)
AST: 16 U/L (ref 15–41)
Albumin: 4.8 g/dL (ref 3.5–5.0)
Anion gap: 10 (ref 5–15)
BUN: 11 mg/dL (ref 6–20)
CO2: 21 mmol/L — ABNORMAL LOW (ref 22–32)
CREATININE: 0.53 mg/dL (ref 0.44–1.00)
Calcium: 9.7 mg/dL (ref 8.9–10.3)
Chloride: 102 mmol/L (ref 101–111)
GFR calc non Af Amer: >60 mL/min (ref >60)
Glucose, Bld: 86 mg/dL (ref 65–99)
Potassium: 3.4 mmol/L — ABNORMAL LOW (ref 3.5–5.1)
SODIUM: 133 mmol/L — AB (ref 135–145)
Total Bilirubin: 0.8 mg/dL (ref 0.3–1.2)
Total Protein: 8.4 g/dL — ABNORMAL HIGH (ref 6.5–8.1)

## 2017-02-14 LAB — BETA-HYDROXYBUTYRIC ACID: Beta-Hydroxybutyric Acid: 0.44 mmol/L — ABNORMAL HIGH (ref 0.05–0.27)

## 2017-02-14 LAB — CBC
HCT: 38.6 % (ref 35.0–47.0)
Hemoglobin: 13.5 g/dL (ref 12.0–16.0)
MCH: 28.9 pg (ref 26.0–34.0)
MCHC: 34.9 g/dL (ref 32.0–36.0)
MCV: 82.7 fL (ref 80.0–100.0)
PLATELETS: 280 10*3/uL (ref 150–440)
RBC: 4.67 MIL/uL (ref 3.80–5.20)
RDW: 12.6 % (ref 11.5–14.5)
WBC: 11.7 10*3/uL — ABNORMAL HIGH (ref 3.6–11.0)

## 2017-02-14 LAB — POCT PREGNANCY, URINE: Preg Test, Ur: POSITIVE — AB

## 2017-02-14 MED ORDER — DEXTROSE-NACL 5-0.9 % IV SOLN
INTRAVENOUS | Status: DC
  Administered 2017-02-14: 21:00:00 via INTRAVENOUS
  Filled 2017-02-14: qty 1000

## 2017-02-14 MED ORDER — SODIUM CHLORIDE 0.9 % IV BOLUS (SEPSIS): 1000.0000 mL | Freq: Once | INTRAVENOUS | Status: DC

## 2017-02-14 MED ORDER — DEXTROSE 50 % IV SOLN: 2.0000 | Freq: Once | INTRAVENOUS | Status: DC

## 2017-02-14 MED ORDER — METOCLOPRAMIDE HCL 10 MG PO TABS: 10.0000 mg | ORAL_TABLET | Freq: Three times a day (TID) | ORAL | 0 refills | Status: DC

## 2017-02-14 MED ORDER — METOCLOPRAMIDE HCL 5 MG/ML IJ SOLN
10.0000 mg | Freq: Once | INTRAMUSCULAR | Status: AC
  Administered 2017-02-14: 10 mg via INTRAVENOUS

## 2017-02-14 MED ORDER — METOCLOPRAMIDE HCL 5 MG/ML IJ SOLN
INTRAMUSCULAR | Status: AC
  Administered 2017-02-14: 10 mg via INTRAVENOUS
  Filled 2017-02-14: qty 2

## 2017-02-14 NOTE — Telephone Encounter (Signed)
Patient called again about getting a medication for the morning sickness - she is sick all day long and can't keep anything down Please [facility] [address]

## 2017-02-14 NOTE — ED Notes (Signed)
Pt discharged to home.  Family member driving.  Discharge instructions reviewed.  Verbalized understanding.  No questions or concerns at this time.  Teach back verified.  Pt in NAD.  No items left in ED.   

## 2017-02-14 NOTE — Discharge Instructions (Signed)
Please use Reglan 3 times a day as needed for nausea and vomiting. You can also purchase over-the-counter vitamin B6 and take this 3 times a day as needed for nausea as well as over-the-counter Unisom to take nightly.  Please establish care with an OB gynecologist for this pregnancy. I've referred you to Dr. Jean RosenthalJackson who is on call tonight.  Return to the emergency department for any concerns.  It was a pleasure to take care of you today, and thank you for coming to our emergency department.  If you have any questions or concerns before leaving please ask the nurse to grab me and I'm more than happy to go through your aftercare instructions again.  If you were prescribed any opioid pain medication today such as Norco, Vicodin, Percocet, morphine, hydrocodone, or oxycodone please make sure you do not drive when you are taking this medication as it can alter your ability to drive safely.  If you have any concerns once you are home that you are not improving or are in fact getting worse before you can make it to your follow-up appointment, please do not hesitate to call 911 and come back for further evaluation.  Merrily BrittleNeil Daveena Elmore MD  Results for orders placed or performed during the hospital encounter of 02/14/17  Comprehensive metabolic panel  Result Value Ref Range   Sodium 133 (L) 135 - 145 mmol/L   Potassium 3.4 (L) 3.5 - 5.1 mmol/L   Chloride 102 101 - 111 mmol/L   CO2 21 (L) 22 - 32 mmol/L   Glucose, Bld 86 65 - 99 mg/dL   BUN 11 6 - 20 mg/dL   Creatinine, Ser 1.610.53 0.44 - 1.00 mg/dL   Calcium 9.7 8.9 - 09.610.3 mg/dL   Total Protein 8.4 (H) 6.5 - 8.1 g/dL   Albumin 4.8 3.5 - 5.0 g/dL   AST 16 15 - 41 U/L   ALT 13 (L) 14 - 54 U/L   Alkaline Phosphatase 66 38 - 126 U/L   Total Bilirubin 0.8 0.3 - 1.2 mg/dL   GFR calc non Af Amer >60 >60 mL/min   GFR calc Af Amer >60 >60 mL/min   Anion gap 10 5 - 15  CBC  Result Value Ref Range   WBC 11.7 (H) 3.6 - 11.0 K/uL   RBC 4.67 3.80 - 5.20 MIL/uL    Hemoglobin 13.5 12.0 - 16.0 g/dL   HCT 04.538.6 40.935.0 - 81.147.0 %   MCV 82.7 80.0 - 100.0 fL   MCH 28.9 26.0 - 34.0 pg   MCHC 34.9 32.0 - 36.0 g/dL   RDW 91.412.6 78.211.5 - 95.614.5 %   Platelets 280 150 - 440 K/uL  Urinalysis, Complete w Microscopic  Result Value Ref Range   Color, Urine YELLOW (A) YELLOW   APPearance HAZY (A) CLEAR   Specific Gravity, Urine 1.013 1.005 - 1.030   pH 6.0 5.0 - 8.0   Glucose, UA NEGATIVE NEGATIVE mg/dL   Hgb urine dipstick SMALL (A) NEGATIVE   Bilirubin Urine NEGATIVE NEGATIVE   Ketones, ur 5 (A) NEGATIVE mg/dL   Protein, ur NEGATIVE NEGATIVE mg/dL   Nitrite NEGATIVE NEGATIVE   Leukocytes, UA MODERATE (A) NEGATIVE   RBC / HPF 0-5 0 - 5 RBC/hpf   WBC, UA 6-30 0 - 5 WBC/hpf   Bacteria, UA NONE SEEN NONE SEEN   Squamous Epithelial / LPF 0-5 (A) NONE SEEN   Mucous PRESENT   Beta-hydroxybutyric acid  Result Value Ref Range   Beta-Hydroxybutyric Acid  0.44 (H) 0.05 - 0.27 mmol/L  Pregnancy, urine POC  Result Value Ref Range   Preg Test, Ur POSITIVE (A) NEGATIVE

## 2017-02-14 NOTE — ED Provider Notes (Signed)
[facility] Emergency Department Provider Note  ____________________________________________   First MD Initiated Contact with Patient 02/14/17 1941     (approximate)  I have reviewed the triage vital signs and the nursing notes.   HISTORY  Chief Complaint Nausea and Emesis    HPI Hannah Hancock is a 21 y.o. female who comes to the emergency Department with roughly 2 days of nausea vomiting and difficulty eating. She is roughly [redacted] weeks pregnant with a desired pregnancy. This is her first pregnancy. She has been taking prenatal vitamins. She does not have an OB gynecologist. Today she tried to eat crackers centimeters throat which is what prompted the visit. Somewhat dehydrated.   Past Medical History:  Diagnosis Date  . Abdominal pain, recurrent   . 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 Asthma   . Tendinitis    R hip  . Urinary tract infection    Last one 4 months ago  . Varicella   . Weight loss     Patient Active Problem List   Diagnosis Date Noted  . 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 Presence of subdermal contraceptive device 04/07/2014  . Sleep disturbance 11/03/2013  . Adjustment disorder with mixed anxiety and depressed mood 08/12/2013    Past Surgical History:  Procedure Laterality Date  . ADENOIDECTOMY    . ESOPHAGOGASTRODUODENOSCOPY  10/20/2011   Procedure: ESOPHAGOGASTRODUODENOSCOPY (EGD);  Surgeon: Jon Gills, MD;  Location: [facility] OR;  Service: Gastroenterology;  Laterality: N/A;    Prior to Admission medications   Medication Sig Start Date End Date Taking? Authorizing Provider  cephALEXin (KEFLEX) 500 MG capsule Take 1 capsule (500 mg total) by mouth 4 (four) times daily. 03/24/15   Tommi Rumps, PA-C  clindamycin-benzoyl peroxide [facility]) gel  03/31/14   [provider]  cyclobenzaprine (FLEXERIL) 10 MG tablet Take 1 tablet (10 mg total) by mouth 3 (three) times daily as needed for  muscle spasms. 12/24/16   Triplett, Rulon Eisenmenger B, FNP  diclofenac (VOLTAREN) 50 MG EC tablet Take 1 tablet (50 mg total) by mouth 2 (two) times daily. 12/18/14   Verneda Skill, FNP  EPIDUO 0.1-2.5 % gel  03/31/14   [provider]  metoCLOPramide (REGLAN) 10 MG tablet Take 1 tablet (10 mg total) by mouth 3 (three) times daily with meals. 02/14/17 02/14/18  Merrily Brittle, MD  Multiple Vitamin (MULTIVITAMIN) tablet Take 1 tablet by mouth daily.    [provider]  sertraline (ZOLOFT) 25 MG tablet Take 1 tablet (25 mg total) by mouth daily. 02/24/15   Verneda Skill, FNP    Allergies Patient has no known allergies.  Family History  Problem Relation Age of Onset  . Cholelithiasis Mother   . Miscarriages / [place] Mother   . Hypertension Maternal Grandmother   . Kidney disease Maternal Grandmother   . Stroke Maternal Grandmother   . Diabetes Paternal Grandfather   . Hypertension Paternal Grandfather     Social History Social History  Substance Use Topics  . Smoking status: Current Every Day Smoker    Packs/day: 0.30    Years: 1.00    Types: Cigarettes  . Smokeless tobacco: Never Used  . Alcohol use No    Review of Systems Constitutional: No fever/chills Eyes: No visual changes. ENT: No sore throat. Cardiovascular: Denies chest pain. Respiratory: Denies shortness of breath. Gastrointestinal: No abdominal pain.  Positive nausea, positive vomiting.  No diarrhea.  No constipation. Genitourinary: Negative for dysuria. Musculoskeletal: Negative  for back pain. Skin: Negative for rash. Neurological: Negative for headaches, focal weakness or numbness.   ____________________________________________   PHYSICAL EXAM:  VITAL SIGNS: ED Triage Vitals [02/14/17 1823]  Enc Vitals Group     BP 115/70     Pulse Rate 88     Resp 18     Temp 98.6 F (37 C)     Temp Source Oral     SpO2 100 %     Weight 156 lb (70.8 kg)     Height 5\' 3"  (1.6 m)     Head  Circumference      Peak Flow      Pain Score      Pain Loc      Pain Edu?      Excl. in GC?     Constitutional: Alert and oriented 4 appears somewhat uncomfortable no diaphoresis Eyes: PERRL EOMI. Head: Atraumatic. Nose: No congestion/rhinnorhea. Mouth/Throat: No trismus Neck: No stridor.   Cardiovascular: Normal rate, regular rhythm. Grossly normal heart sounds.  Good peripheral circulation. Respiratory: Normal respiratory effort.  No retractions. Lungs CTAB and moving good air Gastrointestinal: Soft nondistended nontender no rebound or guarding no peritonitis Musculoskeletal: No lower extremity edema   Neurologic:  Normal speech and language. No gross focal neurologic deficits are appreciated. Skin:  Skin is warm, dry and intact. No rash noted. Psychiatric: Mood and affect are normal. Speech and behavior are normal.    ____________________________________________ _________________________________________   LABS (all labs ordered are listed, but only abnormal results are displayed)  Labs Reviewed  COMPREHENSIVE METABOLIC PANEL - Abnormal; Notable for the following:       Result Value   Sodium 133 (*)    Potassium 3.4 (*)    CO2 21 (*)    Total Protein 8.4 (*)    ALT 13 (*)    All other components within normal limits  CBC - Abnormal; Notable for the following:    WBC 11.7 (*)    All other components within normal limits  URINALYSIS, COMPLETE (UACMP) WITH MICROSCOPIC - Abnormal; Notable for the following:    Color, Urine YELLOW (*)    APPearance HAZY (*)    Hgb urine dipstick SMALL (*)    Ketones, ur 5 (*)    Leukocytes, UA MODERATE (*)    Squamous Epithelial / LPF 0-5 (*)    All other components within normal limits  BETA-HYDROXYBUTYRIC ACID - Abnormal; Notable for the following:    Beta-Hydroxybutyric Acid 0.44 (*)    All other components within normal limits  POCT PREGNANCY, URINE - Abnormal; Notable for the following:    Preg Test, Ur POSITIVE (*)    All  other components within normal limits  POC URINE PREG, ED    Mild ketosis suggestive of starvation she is pregnant __________________________________________  EKG   ____________________________________________  RADIOLOGY   ____________________________________________   PROCEDURES  Procedure(s) performed: no  Procedures  Critical Care performed: no  Observation: no ____________________________________________   INITIAL IMPRESSION / ASSESSMENT AND PLAN / ED COURSE  Pertinent labs & imaging results that were available during my care of the patient were reviewed by me and considered in my medical decision making (see chart for details).  The patient arrives nauseated and vomiting in her first trimester pregnancy. Given the contraindication to Zofran I gave her 10 mg of metoclopramide as well as a liter of D5NS which has helped her symptoms. She is now able to tolerate a small amount by mouth. I will  discharge her with a short course of Reglan as well as instructions for vitamin B6 as well as dicyclomine and refer her to an OB gynecologist at her request. She is discharged home in improved condition with strict return precautions.      ____________________________________________   FINAL CLINICAL IMPRESSION(S) / ED DIAGNOSES  Final diagnoses:  Morning sickness      NEW MEDICATIONS STARTED DURING THIS VISIT:  Discharge Medication List as of 02/14/2017  9:30 PM    START taking these medications   Details  metoCLOPramide (REGLAN) 10 MG tablet Take 1 tablet (10 mg total) by mouth 3 (three) times daily with meals., Starting Wed 02/14/2017, Until Thu 02/14/2018, Print         Note:  This document was prepared using Dragon voice recognition software and may include unintentional dictation errors.     Merrily Brittle, MD 02/14/17 2324

## 2017-02-14 NOTE — ED Triage Notes (Signed)
States she is [redacted] weeks pregnant and has nausea and vomiting, states inability to keep anything down for 2 days, awake and alert, denies any pain, denies any bleeding

## 2017-03-05 NOTE — Telephone Encounter (Signed)
Not a pt here @ [facility]

## 2017-06-28 ENCOUNTER — Encounter: Payer: Self-pay | Admitting: Emergency Medicine

## 2017-06-28 ENCOUNTER — Observation Stay
Admission: EM | Admit: 2017-06-28 | Discharge: 2017-06-28 | Disposition: A | Discharge status: Home / Self Care | Payer: BLUE CROSS/BLUE SHIELD | Attending: Obstetrics and Gynecology | Admitting: Obstetrics and Gynecology

## 2017-06-28 ENCOUNTER — Other Ambulatory Visit: Payer: Self-pay

## 2017-06-28 DIAGNOSIS — R109 Unspecified abdominal pain: Secondary | ICD-10-CM

## 2017-06-28 DIAGNOSIS — O26892 Other specified pregnancy related conditions, second trimester: Secondary | ICD-10-CM | POA: Diagnosis not present

## 2017-06-28 DIAGNOSIS — R1011 Right upper quadrant pain: Secondary | ICD-10-CM | POA: Insufficient documentation

## 2017-06-28 DIAGNOSIS — Z3A26 26 weeks gestation of pregnancy: Secondary | ICD-10-CM | POA: Diagnosis not present

## 2017-06-28 DIAGNOSIS — O219 Vomiting of pregnancy, unspecified: Secondary | ICD-10-CM | POA: Diagnosis not present

## 2017-06-28 DIAGNOSIS — R1012 Left upper quadrant pain: Secondary | ICD-10-CM | POA: Diagnosis not present

## 2017-06-28 DIAGNOSIS — O26899 Other specified pregnancy related conditions, unspecified trimester: Secondary | ICD-10-CM | POA: Diagnosis present

## 2017-06-28 LAB — CBC WITH DIFFERENTIAL/PLATELET
BASOS PCT: 0 %
Basophils Absolute: 0 10*3/uL (ref 0–0.1)
EOS ABS: 0.1 10*3/uL (ref 0–0.7)
EOS PCT: 0 %
HCT: 31.3 % — ABNORMAL LOW (ref 35.0–47.0)
Hemoglobin: 10.6 g/dL — ABNORMAL LOW (ref 12.0–16.0)
Lymphocytes Relative: 19 %
Lymphs Abs: 3.3 10*3/uL (ref 1.0–3.6)
MCH: 28.2 pg (ref 26.0–34.0)
MCHC: 33.8 g/dL (ref 32.0–36.0)
MCV: 83.5 fL (ref 80.0–100.0)
MONO ABS: 1.1 10*3/uL — AB (ref 0.2–0.9)
MONOS PCT: 6 %
Neutro Abs: 13 10*3/uL — ABNORMAL HIGH (ref 1.4–6.5)
Neutrophils Relative %: 75 %
PLATELETS: 256 10*3/uL (ref 150–440)
RBC: 3.75 MIL/uL — ABNORMAL LOW (ref 3.80–5.20)
RDW: 13 % (ref 11.5–14.5)
WBC: 17.5 10*3/uL — ABNORMAL HIGH (ref 3.6–11.0)

## 2017-06-28 LAB — COMPREHENSIVE METABOLIC PANEL
ALBUMIN: 3.1 g/dL — AB (ref 3.5–5.0)
ALT: 10 U/L — ABNORMAL LOW (ref 14–54)
ANION GAP: 10 (ref 5–15)
AST: 15 U/L (ref 15–41)
Alkaline Phosphatase: 85 U/L (ref 38–126)
BILIRUBIN TOTAL: 0.6 mg/dL (ref 0.3–1.2)
BUN: 10 mg/dL (ref 6–20)
CHLORIDE: 105 mmol/L (ref 101–111)
CO2: 22 mmol/L (ref 22–32)
Calcium: 8.9 mg/dL (ref 8.9–10.3)
Creatinine, Ser: 0.43 mg/dL — ABNORMAL LOW (ref 0.44–1.00)
GFR calc Af Amer: >60 mL/min (ref >60)
GFR calc non Af Amer: >60 mL/min (ref >60)
GLUCOSE: 94 mg/dL (ref 65–99)
POTASSIUM: 3.4 mmol/L — AB (ref 3.5–5.1)
SODIUM: 137 mmol/L (ref 135–145)
TOTAL PROTEIN: 6.8 g/dL (ref 6.5–8.1)

## 2017-06-28 LAB — TYPE AND SCREEN
ABO/RH(D): B POS
ANTIBODY SCREEN: NEGATIVE

## 2017-06-28 LAB — LIPASE, BLOOD: Lipase: 41 U/L (ref 11–51)

## 2017-06-28 MED ORDER — OXYCODONE-ACETAMINOPHEN 5-325 MG PO TABS: 1.0000 | ORAL_TABLET | ORAL | Status: DC | PRN

## 2017-06-28 MED ORDER — LACTATED RINGERS IV SOLN: 500.0000 mL | INTRAVENOUS | Status: DC | PRN

## 2017-06-28 MED ORDER — ACETAMINOPHEN 325 MG PO TABS: 650.0000 mg | ORAL_TABLET | ORAL | Status: DC | PRN

## 2017-06-28 NOTE — OB Triage Note (Signed)
Patient given discharge instructions, reviewed, verbalized understanding.

## 2017-06-28 NOTE — OB Triage Note (Signed)
Patient arrived in triage with complaints of upper abdominal pain "7/10" that started at approx. 2350 and lasted about 1 hour. Pain was constant and followed by 3 episodes of emesis. Denies pain and nausea at this time. Reports good fetal movement. Denies contractions, leaking of fluid, and vaginal bleeding. Monitors applied and assessing.

## 2017-06-28 NOTE — ED Triage Notes (Signed)
Pt c/o sudden onset of N/V and lower abdominal pain starting at 2330 tonight. Pt reports she is [redacted] week pregnant. Pt denies vaginal bleeding or discharge.
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
## 2017-06-28 NOTE — Discharge Summary (Signed)
Obstetrical Discharge Summary  Patient Name: Hannah Hancock DOB: 01/19/1996 MRN: [id number]  Date of Admission: 06/28/2017 Date of Delivery: N/A Delivered by: N/A Date of Discharge: 06/28/2017  [facility] WUJ:[id number]:Patient's last menstrual period was 11/19/2016 (approximate). EDC Estimated Date of Delivery: 10/02/17 Gestational Age at Delivery: 3170w2d   Antepartum complications: RUQ and LUQ pain  Admitting Diagnosis: IUP at 26 2/7 weeks with RUQ and LUQ pain  Secondary Diagnosis: Patient Active Problem List   Diagnosis Date Noted  . Abdominal pain affecting pregnancy 06/28/2017  . 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 Presence of subdermal contraceptive device 04/07/2014  . Sleep disturbance 11/03/2013  . Adjustment disorder with mixed anxiety and depressed mood 08/12/2013    Augmentation: none Complications: None Intrapartum complications/course:  Date of Delivery:  Delivered By: Ranae Plumberhelsea [name] Delivery Type: To be determined Anesthesia: N/A Placenta: N/A Episiotomy: none Newborn Data: This patient has no babies on file.   Postpartum Procedures: N/A  Post partum course: N/A Patient had an uncomplicated evaluation and treatment [facility]. [facility] [place] [facility]   Discharge Physical Exam:  BP 114/64 (BP Location: Right Arm)   Pulse 81   Temp 98.3 F (36.8 C) (Oral)   Resp 16   Ht 5\' 3"  (1.6 m)   Wt 72.6 kg (160 lb)   LMP 11/19/2016 (Approximate)   SpO2 99%   BMI 28.34 kg/m   General: NAD CV: RRR, S1S2, No M/R/G Pulm: CTABL, nl effort ABD: Gravid, Non-tender to RUQ and LUQ palpation Incision:N/A DVT Evaluation: LE non-ttp, no evidence of DVT on exam.  Hemoglobin  Date Value Ref Range Status  06/28/2017 10.6 (L) 12.0 - 16.0 g/dL Final   HGB  Date Value Ref Range Status  01/12/2014 14.3 12.0 - 16.0 g/dL Final   HCT  Date Value Ref Range Status  06/28/2017 31.3 (L) 35.0 - 47.0 % Final  01/12/2014  42.0 35.0 - 47.0 % Final   06/28/17: WBC 17,000 AST and ALT were normal to low   Disposition: stable, discharge to home. Baby Feeding: to be decided Baby Disposition:Pt is undelivered  Rh Immune globulin given: None Rubella vaccine given: Immune Tdap vaccine given in AP or PP setting: N/A Flu vaccine given in AP or PP setting: N/A  Contraception: To be detmined PP  Prenatal Labs: See H&P    Plan:  Hannah Hancock was discharged to home in good condition. [facility].   Discharge Medications: PNV  Signed: Sharee Pimplearon W.Jones, RN, MSN, CNM, FNP

## 2017-08-21 NOTE — L&D Delivery Note (Signed)
Date of delivery: 10/04/2017 Estimated Date of Delivery: 10/02/17 Patient's last menstrual period was 11/19/2016 (approximate). EGA: 541w2d  Delivery Note Hannah Hancock presented to L&D for IOL after elevated blood pressure in the office. She received 1 dose of misoprostol, had SROM, and recived with pitocin. Epidural placed for pain relief. She progressed to complete and had a spontaneous vaginal birth of a live female over an intact perineum under epidural anesthesia. The fetal head was delivered at 3:29pm with restitution to LOT. Anterior then posterior shoulders delivered with minimal assistance. Baby girl Hannah Hancock was placed on mom's chest and attended to by transition RN. Cord clamped and cut when pulseless. Placenta delivered spontaneously and intact with 3 cord vessels. IV pitocin given for hemorrhage prophylaxis. 3rd degree repaired with the assistance of Drs. [name] and Dalbert GarnetBeasley. EBL 400 mL.    Anesthesia: epidural Episiotomy: None Lacerations: 3rd degree Suture Repair: 2.0 3.0 vicryl vicryl rapide Est. Blood Loss (mL): 400mL  APGAR: 8, 9; weight 8lb 3.2oz.  Mom to postpartum.  Baby to Couplet care / Skin to Skin.  Genia DelMargaret Calogero Geisen, [place] 10/04/2017 5:17 PM

## 2017-10-03 ENCOUNTER — Other Ambulatory Visit: Payer: Self-pay

## 2017-10-03 ENCOUNTER — Inpatient Hospital Stay
Admission: EM | Admit: 2017-10-03 | Discharge: 2017-10-06 | DRG: 768 | Disposition: A | Discharge status: Home / Self Care | Payer: Medicaid Other | Attending: Certified Nurse Midwife | Admitting: Certified Nurse Midwife

## 2017-10-03 DIAGNOSIS — D649 Anemia, unspecified: Secondary | ICD-10-CM | POA: Diagnosis present

## 2017-10-03 DIAGNOSIS — O2662 Liver and biliary tract disorders in childbirth: Secondary | ICD-10-CM | POA: Diagnosis present

## 2017-10-03 DIAGNOSIS — Z3A40 40 weeks gestation of pregnancy: Secondary | ICD-10-CM

## 2017-10-03 DIAGNOSIS — O1494 Unspecified pre-eclampsia, complicating childbirth: Principal | ICD-10-CM | POA: Diagnosis present

## 2017-10-03 DIAGNOSIS — O99824 Streptococcus B carrier state complicating childbirth: Secondary | ICD-10-CM | POA: Diagnosis present

## 2017-10-03 DIAGNOSIS — Z8759 Personal history of other complications of pregnancy, childbirth and the puerperium: Secondary | ICD-10-CM | POA: Insufficient documentation

## 2017-10-03 DIAGNOSIS — O9902 Anemia complicating childbirth: Secondary | ICD-10-CM | POA: Diagnosis present

## 2017-10-03 DIAGNOSIS — O133 Gestational [pregnancy-induced] hypertension without significant proteinuria, third trimester: Secondary | ICD-10-CM

## 2017-10-03 DIAGNOSIS — Z87891 Personal history of nicotine dependence: Secondary | ICD-10-CM | POA: Diagnosis not present

## 2017-10-03 DIAGNOSIS — K8020 Calculus of gallbladder without cholecystitis without obstruction: Secondary | ICD-10-CM | POA: Diagnosis present

## 2017-10-03 DIAGNOSIS — O139 Gestational [pregnancy-induced] hypertension without significant proteinuria, unspecified trimester: Secondary | ICD-10-CM

## 2017-10-03 DIAGNOSIS — R030 Elevated blood-pressure reading, without diagnosis of hypertension: Secondary | ICD-10-CM | POA: Diagnosis present

## 2017-10-03 HISTORY — DX: Anxiety disorder, unspecified: F41.9

## 2017-10-03 HISTORY — DX: Calculus of gallbladder without cholecystitis without obstruction: K80.20

## 2017-10-03 HISTORY — DX: Gestational (pregnancy-induced) hypertension without significant proteinuria, unspecified trimester: O13.9

## 2017-10-03 LAB — COMPREHENSIVE METABOLIC PANEL
ALBUMIN: 2.7 g/dL — AB (ref 3.5–5.0)
ALK PHOS: 164 U/L — AB (ref 38–126)
ALT: 9 U/L — ABNORMAL LOW (ref 14–54)
ANION GAP: 10 (ref 5–15)
AST: 20 U/L (ref 15–41)
BILIRUBIN TOTAL: 0.5 mg/dL (ref 0.3–1.2)
BUN: 12 mg/dL (ref 6–20)
CALCIUM: 8.8 mg/dL — AB (ref 8.9–10.3)
CO2: 20 mmol/L — ABNORMAL LOW (ref 22–32)
Chloride: 106 mmol/L (ref 101–111)
Creatinine, Ser: 0.6 mg/dL (ref 0.44–1.00)
GLUCOSE: 106 mg/dL — AB (ref 65–99)
Potassium: 3.9 mmol/L (ref 3.5–5.1)
Sodium: 136 mmol/L (ref 135–145)
TOTAL PROTEIN: 6 g/dL — AB (ref 6.5–8.1)

## 2017-10-03 LAB — TYPE AND SCREEN
ABO/RH(D): B POS
Antibody Screen: NEGATIVE

## 2017-10-03 LAB — URINALYSIS, ROUTINE W REFLEX MICROSCOPIC
Bacteria, UA: NONE SEEN
Bilirubin Urine: NEGATIVE
GLUCOSE, UA: NEGATIVE mg/dL
HGB URINE DIPSTICK: NEGATIVE
Ketones, ur: NEGATIVE mg/dL
LEUKOCYTES UA: NEGATIVE
NITRITE: NEGATIVE
Specific Gravity, Urine: 1.032 — ABNORMAL HIGH (ref 1.005–1.030)
pH: 5 (ref 5.0–8.0)

## 2017-10-03 LAB — PROTEIN / CREATININE RATIO, URINE
Creatinine, Urine: 303 mg/dL
Protein Creatinine Ratio: 4.05 mg/mg{Cre} — ABNORMAL HIGH (ref 0.00–0.15)
Total Protein, Urine: 1228 mg/dL

## 2017-10-03 LAB — CBC
HCT: 31.6 % — ABNORMAL LOW (ref 35.0–47.0)
HEMOGLOBIN: 10.1 g/dL — AB (ref 12.0–16.0)
MCH: 24 pg — ABNORMAL LOW (ref 26.0–34.0)
MCHC: 31.8 g/dL — AB (ref 32.0–36.0)
MCV: 75.4 fL — ABNORMAL LOW (ref 80.0–100.0)
Platelets: 176 10*3/uL (ref 150–440)
RBC: 4.2 MIL/uL (ref 3.80–5.20)
RDW: 20.8 % — ABNORMAL HIGH (ref 11.5–14.5)
WBC: 10.3 10*3/uL (ref 3.6–11.0)

## 2017-10-03 MED ORDER — ONDANSETRON HCL 4 MG/2ML IJ SOLN
4.0000 mg | Freq: Four times a day (QID) | INTRAMUSCULAR | Status: DC | PRN
  Administered 2017-10-04 (×2): 4 mg via INTRAVENOUS
  Filled 2017-10-03 (×2): qty 2

## 2017-10-03 MED ORDER — LACTATED RINGERS IV SOLN
INTRAVENOUS | Status: DC
Start: 2017-10-03 — End: 2017-10-06
  Administered 2017-10-03 – 2017-10-04 (×3): via INTRAVENOUS

## 2017-10-03 MED ORDER — AMMONIA AROMATIC IN INHA
RESPIRATORY_TRACT | Status: AC
  Filled 2017-10-03: qty 10

## 2017-10-03 MED ORDER — OXYTOCIN 10 UNIT/ML IJ SOLN
INTRAMUSCULAR | Status: AC
  Filled 2017-10-03: qty 2

## 2017-10-03 MED ORDER — LACTATED RINGERS IV SOLN
500.0000 mL | INTRAVENOUS | Status: DC | PRN
  Administered 2017-10-04: 250 mL via INTRAVENOUS

## 2017-10-03 MED ORDER — MISOPROSTOL 50MCG HALF TABLET
50.0000 ug | ORAL_TABLET | ORAL | Status: DC | PRN
  Administered 2017-10-03: 50 ug via ORAL
  Filled 2017-10-03: qty 1

## 2017-10-03 MED ORDER — BUTORPHANOL TARTRATE 1 MG/ML IJ SOLN
1.0000 mg | INTRAMUSCULAR | Status: DC | PRN
  Administered 2017-10-03 (×3): 1 mg via INTRAVENOUS
  Filled 2017-10-03 (×3): qty 1

## 2017-10-03 MED ORDER — LIDOCAINE HCL (PF) 1 % IJ SOLN: 30.0000 mL | INTRAMUSCULAR | Status: DC | PRN

## 2017-10-03 MED ORDER — LIDOCAINE HCL (PF) 1 % IJ SOLN
INTRAMUSCULAR | Status: AC
  Filled 2017-10-03: qty 30

## 2017-10-03 MED ORDER — TERBUTALINE SULFATE 1 MG/ML IJ SOLN: 0.2500 mg | Freq: Once | INTRAMUSCULAR | Status: DC | PRN

## 2017-10-03 MED ORDER — OXYTOCIN BOLUS FROM INFUSION
500.0000 mL | Freq: Once | INTRAVENOUS | Status: AC
  Administered 2017-10-04: 500 mL via INTRAVENOUS

## 2017-10-03 MED ORDER — PENICILLIN G POTASSIUM 5000000 UNITS IJ SOLR
2.5000 10*6.[IU] | INTRAVENOUS | Status: DC
  Administered 2017-10-03 – 2017-10-04 (×2): 2.5 10*6.[IU] via INTRAVENOUS
  Filled 2017-10-03 (×6): qty 2.5

## 2017-10-03 MED ORDER — SOD CITRATE-CITRIC ACID 500-334 MG/5ML PO SOLN: 30.0000 mL | ORAL | Status: DC | PRN

## 2017-10-03 MED ORDER — SODIUM CHLORIDE 0.9 % IV SOLN
5.0000 10*6.[IU] | Freq: Once | INTRAVENOUS | Status: AC
  Administered 2017-10-03: 5 10*6.[IU] via INTRAVENOUS
  Filled 2017-10-03: qty 5

## 2017-10-03 MED ORDER — MISOPROSTOL 200 MCG PO TABS
ORAL_TABLET | ORAL | Status: AC
  Filled 2017-10-03: qty 4

## 2017-10-03 MED ORDER — ACETAMINOPHEN 325 MG PO TABS
650.0000 mg | ORAL_TABLET | ORAL | Status: DC | PRN
  Filled 2017-10-03: qty 2

## 2017-10-03 MED ORDER — OXYTOCIN 40 UNITS IN LACTATED RINGERS INFUSION - SIMPLE MED
2.5000 [IU]/h | INTRAVENOUS | Status: DC
  Administered 2017-10-04: 2.5 [IU]/h via INTRAVENOUS
  Filled 2017-10-03: qty 1000

## 2017-10-03 NOTE — H&P (Signed)
OB History & Physical   History of Present Illness:  Chief Complaint: Elevated BP in office  HPI:  Hannah Hancock is a 22 y.o. G1P0 female at [redacted]w[redacted]d dated by Patient's last menstrual period was 11/19/2016 (approximate). and c/w US done at [redacted]w[redacted]d.  She presents to L&D for elevated BP in the office today with significant edema.   Active FM, denies UCs, VB or LOF. Denies HA, visual disturbances or RUQ pain.    Pregnancy Issues: 1. Asthma: does not need Inhaler 2. Anxiety/Depression: stopped meds during pregnancy, previously well controlled with Zoloft.  3. Gallstones: plans 2 week PP surgery Lap with [facility] Katrinka Blazing [facility] 4. Anemia- hgb 9.5; 12/5- to start BID FeSO4 with Vit C 5. Elevated BP at [redacted]w[redacted]d with edema   Maternal Medical History:   Past Medical History:  Diagnosis Date  . Abdominal pain, recurrent   . 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 Anxiety   . Asthma    no meds, no recent attacks  . Gallstones   . Tendinitis    R hip  . Urinary tract infection    Last one 4 months ago  . Weight loss     Past Surgical History:  Procedure Laterality Date  . ADENOIDECTOMY    . ESOPHAGOGASTRODUODENOSCOPY  10/20/2011   Procedure: ESOPHAGOGASTRODUODENOSCOPY (EGD);  Surgeon: Jon Gills, MD;  Location: [facility] OR;  Service: Gastroenterology;  Laterality: N/A;    No Known Allergies  Prior to Admission medications   Medication Sig Start Date End Date Taking? Authorizing Provider  cephALEXin (KEFLEX) 500 MG capsule Take 1 capsule (500 mg total) by mouth 4 (four) times daily. 03/24/15   Tommi Rumps, PA-C  clindamycin-benzoyl peroxide [facility]) gel  03/31/14   [provider]  cyclobenzaprine (FLEXERIL) 10 MG tablet Take 1 tablet (10 mg total) by mouth 3 (three) times daily as needed for muscle spasms. 12/24/16   Triplett, Rulon Eisenmenger B, FNP  diclofenac (VOLTAREN) 50 MG EC tablet Take 1 tablet (50 mg total) by mouth 2 (two) times daily. 12/18/14   Verneda Skill, FNP   EPIDUO 0.1-2.5 % gel  03/31/14   [provider]  metoCLOPramide (REGLAN) 10 MG tablet Take 1 tablet (10 mg total) by mouth 3 (three) times daily with meals. 02/14/17 02/14/18  Merrily Brittle, MD  Multiple Vitamin (MULTIVITAMIN) tablet Take 1 tablet by mouth daily.    [provider]  sertraline (ZOLOFT) 25 MG tablet Take 1 tablet (25 mg total) by mouth daily. 02/24/15   Verneda Skill, FNP     Prenatal care site: [facility]   Social History: She  reports that she quit smoking about 2 years ago. Her smoking use included cigarettes. She has a 0.30 pack-year smoking history. she has never used smokeless tobacco. She reports that she does not drink alcohol or use drugs.  Family History: family history includes Cholelithiasis in her mother; Diabetes in her paternal grandfather; Hypertension in her maternal grandmother and paternal grandfather; Kidney disease in her maternal grandmother; Miscarriages / [place] in her mother; Stroke in her maternal grandmother.   Review of Systems: A full review of systems was performed and negative except as noted in the HPI.     Physical Exam:  Vital Signs: BP (!) 138/96   Pulse 96   Temp 98.5 F (36.9 C) (Oral)   Resp 16   Ht 5\' 3"  (1.6 m)   Wt 194 lb (88 kg)   LMP 11/19/2016 (Approximate)  BMI 34.37 kg/m  General: no acute distress.  HEENT: normocephalic, atraumatic Heart: regular rate & rhythm.  No murmurs/rubs/gallops Lungs: clear to auscultation bilaterally, normal respiratory effort Abdomen: soft, gravid, non-tender;  EFW: 7.5lbs Pelvic:   External: Normal external female genitalia  Cervix:   /   /   2/75/-2; soft/posterior (in office exam today).    Extremities: non-tender, symmetric, 2+ edema bilaterally.  DTRs: 2+, no clonus  Neurologic: Alert & oriented x 3.    Results for orders placed or performed during the hospital encounter of 10/03/17 (from the past 24 hour(s))  Urinalysis, Routine w reflex  microscopic     Status: Abnormal   Collection Time: 10/03/17  3:04 PM  Result Value Ref Range   Color, Urine AMBER (A) YELLOW   APPearance HAZY (A) CLEAR   Specific Gravity, Urine 1.032 (H) 1.005 - 1.030   pH 5.0 5.0 - 8.0   Glucose, UA NEGATIVE NEGATIVE mg/dL   Hgb urine dipstick NEGATIVE NEGATIVE   Bilirubin Urine NEGATIVE NEGATIVE   Ketones, ur NEGATIVE NEGATIVE mg/dL   Protein, ur >=161>=300 (A) NEGATIVE mg/dL   Nitrite NEGATIVE NEGATIVE   Leukocytes, UA NEGATIVE NEGATIVE   RBC / HPF 0-5 0 - 5 RBC/hpf   WBC, UA 0-5 0 - 5 WBC/hpf   Bacteria, UA NONE SEEN NONE SEEN   Squamous Epithelial / LPF 0-5 (A) NONE SEEN   Mucus PRESENT   Comprehensive metabolic panel     Status: Abnormal   Collection Time: 10/03/17  3:13 PM  Result Value Ref Range   Sodium 136 135 - 145 mmol/L   Potassium 3.9 3.5 - 5.1 mmol/L   Chloride 106 101 - 111 mmol/L   CO2 20 (L) 22 - 32 mmol/L   Glucose, Bld 106 (H) 65 - 99 mg/dL   BUN 12 6 - 20 mg/dL   Creatinine, Ser 0.960.60 0.44 - 1.00 mg/dL   Calcium 8.8 (L) 8.9 - 10.3 mg/dL   Total Protein 6.0 (L) 6.5 - 8.1 g/dL   Albumin 2.7 (L) 3.5 - 5.0 g/dL   AST 20 15 - 41 U/L   ALT 9 (L) 14 - 54 U/L   Alkaline Phosphatase 164 (H) 38 - 126 U/L   Total Bilirubin 0.5 0.3 - 1.2 mg/dL   GFR calc non Af Amer >60 >60 mL/min   GFR calc Af Amer >60 >60 mL/min   Anion gap 10 5 - 15  CBC     Status: Abnormal   Collection Time: 10/03/17  3:13 PM  Result Value Ref Range   WBC 10.3 3.6 - 11.0 K/uL   RBC 4.20 3.80 - 5.20 MIL/uL   Hemoglobin 10.1 (L) 12.0 - 16.0 g/dL   HCT 04.531.6 (L) 40.935.0 - 81.147.0 %   MCV 75.4 (L) 80.0 - 100.0 fL   MCH 24.0 (L) 26.0 - 34.0 pg   MCHC 31.8 (L) 32.0 - 36.0 g/dL   RDW 91.420.8 (H) 78.211.5 - 95.614.5 %   Platelets 176 150 - 440 K/uL  Type and screen [facility]     Status: None (Preliminary result)   Collection Time: 10/03/17  3:26 PM  Result Value Ref Range   ABO/RH(D) PENDING    Antibody Screen PENDING    Sample Expiration       10/06/2017 Performed at [facility], [address] [place], [place] [address]     Pertinent Results:  Prenatal Labs: Blood type/Rh B Pos  Antibody screen neg  Rubella Immune  Varicella Immune  RPR NR  HBsAg Neg  HIV NR  GC neg  Chlamydia neg  Genetic screening 2nd tri screen negative  1 hour GTT 99  GBS Positive   FHT: Cat I tracing, 130bpm, mod variability, + accels, no decels.  TOCO: q7-22min, irregular SVE:    /   /      Cephalic by leopolds  No results found.  Assessment:  Aleida D Wessells is a 22 y.o. G1P0 female at [redacted]w[redacted]d with Gestational Hypertension.   Plan:  1. Admit to Labor & Delivery; consents reviewed and obtained - GHTN- likely pre-eclampsia without severe features. Pt asymptomatic, urinalysis > 300 protein. CMP WNL.   2. Fetal Well being  - Fetal Tracing: Cat I - Group B Streptococcus ppx indicated: yes, PCN ordered.  - Presentation: cephalic confirmed by exam   3. Routine OB: - Prenatal labs reviewed, as above - Rh: B Pos - CBC, T&S, RPR, CMP and urine P/C ratio on admit - Clear fluids, IVF  4. Monitoring of Induction of Labor -  Contractions: external toco in place -  Pelvis unproven, adequate for TOL.  -  Plan for induction with cytotec PO, 1st dose given at 1600 -  Plan for continuous fetal monitoring  -  Maternal pain control as desired; reviewed options available.  - Anticipate vaginal delivery  5. Post Partum Planning: - Infant feeding: breast - Contraception: mini-pill  Errin Whitelaw A, CNM 10/03/17 5:02 PM

## 2017-10-03 NOTE — Progress Notes (Signed)
Labor Progress Note  Hannah Hancock is a 22 y.o. G1P0 at 327w1d by Patient's last menstrual period was 11/19/2016 (approximate) and c/w US done at 8557w3d. Admitted for IOL due to Pre-eclampsia without severe features.    Subjective:  Feeling some relief after dose of Stadol, continues to leak clear fluid since SROM. Denies HA, VD, RUQ pain.   Objective: BP (!) 147/90   Pulse 67   Temp 98.3 F (36.8 C) (Oral)   Resp 18   Ht 5\' 3"  (1.6 m)   Wt 194 lb (88 kg)   LMP 11/19/2016 (Approximate)   BMI 34.37 kg/m  Notable VS details: reviewed.   Fetal Assessment: FHT:  FHR: 130 bpm, variability: moderate,  accelerations:  Present,  decelerations:  Absent Category/reactivity:  Category I UC:   regular, every 2-4 minutes SVE:   3-4/70/-2 per RN exam Membrane status: SROM at 1945 Amniotic color: clear  Labs: Lab Results  Component Value Date   WBC 10.3 10/03/2017   HGB 10.1 (L) 10/03/2017   HCT 31.6 (L) 10/03/2017   MCV 75.4 (L) 10/03/2017   PLT 176 10/03/2017   P/C ratio 4.05 CMP wnl  Assessment / Plan: IOL at 3535w0d due to pre-eclampsia without severe features.   Labor: s/p single dose of cytotec and SROM, will monitor. if no cervical change or inadequate UC pattern after 4hrs, then start Pitocin.  Preeclampsia:  no signs or symptoms of toxicity and labs stable Fetal Wellbeing:  Category I Pain Control:  IV pain meds I/D:  PCN for GBS prophy Anticipated MOD:  NSVD  Brooke Steinhilber A, CNM 10/03/2017, 8:30 PM

## 2017-10-03 NOTE — OB Triage Note (Signed)
Ms. Hannah Hancock sent over from office for pre-eclampsia evaluation. Reports lower extremity swelling, denies epigastric pain but reports "right side of my chest hurts, from anxiety" - pt reports previous tx for anxiety and depression (no meds now), previous thoughts of self harm but none currently, encouraged to voice concerns and seek help if these thoughts return or if any thoughts of harming her baby. Denies bleeding, LOF, reports positive fetal movement.

## 2017-10-04 ENCOUNTER — Other Ambulatory Visit: Payer: Self-pay

## 2017-10-04 ENCOUNTER — Encounter: Payer: Self-pay | Admitting: Anesthesiology

## 2017-10-04 ENCOUNTER — Inpatient Hospital Stay: Payer: Medicaid Other | Admitting: Anesthesiology

## 2017-10-04 MED ORDER — FENTANYL 2.5 MCG/ML W/ROPIVACAINE 0.15% IN NS 100 ML EPIDURAL (ARMC)
12.0000 mL/h | EPIDURAL | Status: DC
  Administered 2017-10-04 (×2): 12 mL/h via EPIDURAL
  Filled 2017-10-04 (×2): qty 100

## 2017-10-04 MED ORDER — BUPIVACAINE HCL (PF) 0.25 % IJ SOLN
INTRAMUSCULAR | Status: DC | PRN
  Administered 2017-10-04: 10 mL via EPIDURAL

## 2017-10-04 MED ORDER — EPHEDRINE 5 MG/ML INJ: 10.0000 mg | INTRAVENOUS | Status: DC | PRN

## 2017-10-04 MED ORDER — ACETAMINOPHEN 325 MG PO TABS: 650.0000 mg | ORAL_TABLET | ORAL | Status: DC | PRN

## 2017-10-04 MED ORDER — LACTATED RINGERS IV SOLN: 500.0000 mL | Freq: Once | INTRAVENOUS | Status: DC

## 2017-10-04 MED ORDER — COCONUT OIL OIL
1.0000 "application " | TOPICAL_OIL | Status: DC | PRN
  Administered 2017-10-05: 1 via TOPICAL
  Filled 2017-10-04: qty 120

## 2017-10-04 MED ORDER — WITCH HAZEL-GLYCERIN EX PADS
1.0000 "application " | MEDICATED_PAD | CUTANEOUS | Status: DC | PRN
  Administered 2017-10-05: 1 via TOPICAL
  Filled 2017-10-04: qty 100

## 2017-10-04 MED ORDER — DIPHENHYDRAMINE HCL 50 MG/ML IJ SOLN
12.5000 mg | INTRAMUSCULAR | Status: DC | PRN
  Filled 2017-10-04: qty 1

## 2017-10-04 MED ORDER — FENTANYL 2.5 MCG/ML W/ROPIVACAINE 0.15% IN NS 100 ML EPIDURAL (ARMC)
EPIDURAL | Status: AC
  Filled 2017-10-04: qty 100

## 2017-10-04 MED ORDER — PHENYLEPHRINE 40 MCG/ML (10ML) SYRINGE FOR IV PUSH (FOR BLOOD PRESSURE SUPPORT): 80.0000 ug | PREFILLED_SYRINGE | INTRAVENOUS | Status: DC | PRN

## 2017-10-04 MED ORDER — LIDOCAINE HCL (PF) 2 % IJ SOLN
INTRAMUSCULAR | Status: DC | PRN
  Administered 2017-10-04: 5 mL via INTRADERMAL

## 2017-10-04 MED ORDER — HYDROMORPHONE HCL 1 MG/ML IJ SOLN
INTRAMUSCULAR | Status: AC
  Administered 2017-10-04: 0.5 mg
  Filled 2017-10-04: qty 1

## 2017-10-04 MED ORDER — HYDROMORPHONE HCL 1 MG/ML IJ SOLN
INTRAMUSCULAR | Status: AC
  Filled 2017-10-04: qty 1

## 2017-10-04 MED ORDER — SENNOSIDES-DOCUSATE SODIUM 8.6-50 MG PO TABS
2.0000 | ORAL_TABLET | ORAL | Status: DC
  Administered 2017-10-05 – 2017-10-06 (×2): 2 via ORAL
  Filled 2017-10-04: qty 2

## 2017-10-04 MED ORDER — PRENATAL MULTIVITAMIN CH
1.0000 | ORAL_TABLET | Freq: Every day | ORAL | Status: DC
  Administered 2017-10-05 – 2017-10-06 (×2): 1 via ORAL
  Filled 2017-10-04 (×2): qty 1

## 2017-10-04 MED ORDER — HYDROMORPHONE HCL 1 MG/ML IJ SOLN
1.0000 mg | INTRAMUSCULAR | Status: DC | PRN
  Administered 2017-10-04: 1 mg via INTRAVENOUS

## 2017-10-04 MED ORDER — PENICILLIN G POT IN DEXTROSE 60000 UNIT/ML IV SOLN
3.0000 10*6.[IU] | INTRAVENOUS | Status: DC
  Administered 2017-10-04 (×3): 3 10*6.[IU] via INTRAVENOUS
  Filled 2017-10-04 (×11): qty 50

## 2017-10-04 MED ORDER — LIDOCAINE HCL (PF) 1 % IJ SOLN
INTRAMUSCULAR | Status: DC | PRN
  Administered 2017-10-04: 3 mL

## 2017-10-04 MED ORDER — IBUPROFEN 600 MG PO TABS
ORAL_TABLET | ORAL | Status: AC
  Administered 2017-10-04: 600 mg
  Filled 2017-10-04: qty 1

## 2017-10-04 MED ORDER — DIBUCAINE 1 % RE OINT
1.0000 "application " | TOPICAL_OINTMENT | RECTAL | Status: DC | PRN
  Administered 2017-10-05: 1 via RECTAL
  Filled 2017-10-04: qty 28

## 2017-10-04 MED ORDER — ONDANSETRON HCL 4 MG/2ML IJ SOLN: 4.0000 mg | INTRAMUSCULAR | Status: DC | PRN

## 2017-10-04 MED ORDER — BENZOCAINE-MENTHOL 20-0.5 % EX AERO
1.0000 "application " | INHALATION_SPRAY | CUTANEOUS | Status: DC | PRN
  Administered 2017-10-05: 1 via TOPICAL
  Filled 2017-10-04: qty 56

## 2017-10-04 MED ORDER — IBUPROFEN 600 MG PO TABS
600.0000 mg | ORAL_TABLET | Freq: Four times a day (QID) | ORAL | Status: DC
  Administered 2017-10-05 – 2017-10-06 (×7): 600 mg via ORAL
  Filled 2017-10-04 (×7): qty 1

## 2017-10-04 MED ORDER — SIMETHICONE 80 MG PO CHEW: 160.0000 mg | CHEWABLE_TABLET | Freq: Four times a day (QID) | ORAL | Status: DC | PRN

## 2017-10-04 MED ORDER — HYDROMORPHONE HCL 1 MG/ML IJ SOLN
0.5000 mg | Freq: Once | INTRAMUSCULAR | Status: AC
  Administered 2017-10-04: 0.5 mg via INTRAVENOUS

## 2017-10-04 MED ORDER — OXYTOCIN 40 UNITS IN LACTATED RINGERS INFUSION - SIMPLE MED
1.0000 m[IU]/min | INTRAVENOUS | Status: DC
  Administered 2017-10-04: 2 m[IU]/min via INTRAVENOUS

## 2017-10-04 MED ORDER — ONDANSETRON HCL 4 MG PO TABS: 4.0000 mg | ORAL_TABLET | ORAL | Status: DC | PRN

## 2017-10-04 NOTE — Progress Notes (Signed)
Labor Progress Note  Hannah Hancock is a 22 y.o. G1P0 at 885w2d by LMP c/w 3829w3d u/s admitted for induction of labor due to pre-eclampsia without severe features.  Subjective:  Patient resting in bed. Reporting pain and pressure with contractions.   Objective: BP 128/89   Pulse 79   Temp 97.6 F (36.4 C) (Oral)   Resp 20   Ht 5\' 3"  (1.6 m)   Wt 88 kg (194 lb)   LMP 11/19/2016 (Approximate)   SpO2 95%   BMI 34.37 kg/m  Notable VS details: normotensive  Fetal Assessment: FHT:  FHR: 130 bpm, variability: moderate,  accelerations:  Present,  decelerations:  Absent Category/reactivity:  Category I UC:   regular, every 1-3 minutes SVE:     Dilation: 9.5cm, right sided lip  Effacement: 100%  Station:  0  Consistency: soft  Position: anterior  Membrane status: SROM, AROM Amniotic color: clear  Assessment / Plan: Induction of labor due to preeclampsia  Labor: Progressing normally, Pitocin infusing at 158mu/min, plan to titrate per protocol as needed Preeclampsia:  labs stable, no S/S of severe features Fetal Wellbeing:  Category I Pain Control:  Epidural GBS prophylaxis: administered as ordered Anticipated MOD:  NSVD  Hannah Hancock, CNM 10/04/2017, 12:15 PM

## 2017-10-04 NOTE — Anesthesia Preprocedure Evaluation (Signed)
Anesthesia Evaluation  Patient identified by MRN, date of birth, ID band Patient awake    Reviewed: Allergy & Precautions, NPO status , Patient's Chart, lab work & pertinent test results  Airway Mallampati: II  TM Distance: >3 FB     Dental  (+) Teeth Intact   Pulmonary asthma , former smoker,    Pulmonary exam normal        Cardiovascular hypertension, Normal cardiovascular exam     Neuro/Psych PSYCHIATRIC DISORDERS Anxiety    GI/Hepatic negative GI ROS, Neg liver ROS,   Endo/Other  negative endocrine ROS  Renal/GU negative Renal ROS  negative genitourinary   Musculoskeletal negative musculoskeletal ROS (+)   Abdominal Normal abdominal exam  (+)   Peds negative pediatric ROS (+)  Hematology negative hematology ROS (+)   Anesthesia Other Findings   Reproductive/Obstetrics (+) Pregnancy                             Anesthesia Physical Anesthesia Plan  ASA: II  Anesthesia Plan: Epidural   Post-op Pain Management:    Induction: Intravenous  PONV Risk Score and Plan:   Airway Management Planned: Natural Airway  Additional Equipment:   Intra-op Plan:   Post-operative Plan:   Informed Consent: I have reviewed the patients History and Physical, chart, labs and discussed the procedure including the risks, benefits and alternatives for the proposed anesthesia with the patient or authorized representative who has indicated his/her understanding and acceptance.   Dental advisory given  Plan Discussed with: CRNA and Surgeon  Anesthesia Plan Comments:         Anesthesia Quick Evaluation

## 2017-10-04 NOTE — Procedures (Addendum)
Procedure - Third degree perineal laceration repair  Preop Dx:  1) Intrauterine pregnancy at 40+2 2) Second stage of labor  Postop Dx:  1) Intrauterine pregnancy at 40+2 2) Attainment of second stage  3) Third deg laceration  Procedure: third degree perineal repair with a partial external anal sphincter   Attending Surgeon: Christeen DouglasBethany Beasley, MD MPH, and Ranae Plumberhelsea Rebecka Oelkers, MD Est blood loss: 400  Findings: Vaginal mucosa lacerated to rectal mucosa,  Complications: none  Disposition: Mother-Baby Unit  Anesthesia: epidural, iv dilaudid  DESCRIPTION OF THE PROCEDURE:   Evaluation of the perineum noted a third degree laceration through the internal anal sphincter, with partial external anal sphincter laceration, with the external anal sphincter intact about 50%, and some of the rectal mucosa visually apparent distal to and below the external sphincter. Dr. Elesa MassedWard was present to bring the internal anal sphincter together. This comprised about 5cm.  The external sphincter was grasped with two long Allis clamps and brought to the midline. It was closed in an end-to-end fashion with 2-0 Vicryl in multiple interrupted stiches. It came together without excess tissues tension.  The rectovaginal septum and perineal body were brought together in running layers, adding in a crown stitch to bring the deep edges of the bulbocavernosus together in the midline.   The vaginal mucosa was repaired with 3-0 Vicryl Rapide in a running locked fashion, and the perineal edges were brought together in the midline in a subcuticular fashion. 2 stiches brought the tear together along the perineum.  The repair was difficult due to edema and a right sulcal tear that was bleeding. On the left, behind the hymenal ring was a single deep and bleeding opening behind the bulbocavernous. This was repaired with a deep mattress stitch.  The patient did have an epidural during the repair and 1.5mg  of iv dilaudid was used for comfort.    The patient tolerated the procedure well and is stable in the labor and delivery room.  Dr. Dalbert GarnetBeasley was Thereasa Parkinauthor of this note, and addended by Dr. Elesa MassedWard

## 2017-10-04 NOTE — Progress Notes (Signed)
Labor Progress Note  Venia D Suzie Portelaayne is a 22 y.o. G1P0 at 816w2d by Patient's last menstrual period was 11/19/2016 (approximate) and c/w US done at 4216w3d. Admitted for IOL due to Pre-eclampsia without severe features.    Subjective: comfortable with epidural. Still uncomfortable with vag exams. Denies HA, VD or RUQ pain.   Objective: BP 118/76   Pulse 67   Temp 97.6 F (36.4 C) (Oral)   Resp 20   Ht 5\' 3"  (1.6 m)   Wt 194 lb (88 kg)   LMP 11/19/2016 (Approximate)   SpO2 99%   BMI 34.37 kg/m  Notable VS details: reviewed, normotensive since epidural.  Fetal Assessment: FHT:  FHR: 130 bpm, variability: moderate,  accelerations:  Present,  decelerations:  Absent Category/reactivity:  Category I UC:   regular, every 3-4 minutes  SVE:   Dilation: 6 Effacement (%): 80 Cervical Position: Middle Station: -2 Exam by:: RS  Amniotic color: clear fluid, forebag noted.   Labs: Lab Results  Component Value Date   WBC 10.3 10/03/2017   HGB 10.1 (L) 10/03/2017   HCT 31.6 (L) 10/03/2017   MCV 75.4 (L) 10/03/2017   PLT 176 10/03/2017    Assessment / Plan: IOL at 1329w1d due to pre-eclampsia without severe features.    Labor: Progressing normally after 1 dose of cytotec, SROM with continued cervical change.  Preeclampsia:  no signs or symptoms of toxicity, labs stable and asymptomatic.  Fetal Wellbeing:  Category I Pain Control:  Epidural I/D:  PCN q4h for GBS prophy, s/p 3 doses.  Anticipated MOD:  NSVD  Josemaria Brining A, CNM 10/04/2017, 3:10 AM

## 2017-10-04 NOTE — Anesthesia Procedure Notes (Signed)
Epidural Patient location during procedure: OB Start time: 10/04/2017 12:58 AM End time: 10/04/2017 1:09 AM  Staffing Anesthesiologist: Yves Dillarroll, Phebe Dettmer, MD Performed: anesthesiologist   Preanesthetic Checklist Completed: patient identified, site marked, surgical consent, pre-op evaluation, timeout performed, IV checked, risks and benefits discussed and monitors and equipment checked  Epidural Patient position: sitting Prep: Betadine Patient monitoring: heart rate, continuous pulse ox and blood pressure Approach: midline Location: L3-L4 Injection technique: LOR air  Needle:  Needle type: Tuohy  Needle gauge: 17 G Needle length: 9 cm and 9 Catheter type: closed end flexible Catheter size: 19 Gauge Test dose: negative and 1.5% lidocaine with Epi 1:200 K  Assessment Events: blood not aspirated, injection not painful, no injection resistance, negative IV test and no paresthesia  Additional Notes Time out called.  Patient placed in sitting position.  Back prepped and draped in sterile fashion.  A skin wheal was made in the L3-L4 interspace with 1% Lidocaine plain.  A 17G Tuohy needle was advanced into the epidural space by a loss of resistance technique.  No blood, fluid or paresthesias.  The epidural catheter was advanced 3 cm into the space without issue.  The patient tolerated the procedure well.  The catheter was affixed to the back in sterile fashion.Reason for block:procedure for pain

## 2017-10-04 NOTE — Discharge Summary (Signed)
Obstetric Discharge Summary   Patient ID: Patient Name: Hannah Hancock DOB: 02/10/1996 MRN: [id number]  Date of Admission: 10/03/2017 Date of Delivery: 10/04/17 Delivered by: Genia DelModesta MessingMargaret Haviland, CNM Date of Discharge: 10/06/2017  [facility]  WUJ:[id number]:Patient's last menstrual period was 11/19/2016 (approximate). EDC Estimated Date of Delivery: 10/02/17 Gestational Age at Delivery: 3843w2d   Antepartum complications:  1. Asthma: does not need Inhaler 2. Anxiety/Depression: stopped meds during pregnancy, previously well controlled with Zoloft.  3. Gallstones: plans 2 week PP [facility] 4. Anemia- hgb 9.5; 12/5- to start BID FeSO4 with Vit C 5. Preeclampsia without severe features after admission  Admitting Diagnosis: Gestational hypertension  Secondary Diagnoses: Patient Active Problem List   Diagnosis Date Noted  . Third degree laceration of perineum during delivery, postpartum 10/06/2017  . Gestational hypertension 10/03/2017  . Abdominal pain affecting pregnancy 06/28/2017  . 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 Presence of subdermal contraceptive device 04/07/2014  . Sleep disturbance 11/03/2013  . Adjustment disorder with mixed anxiety and depressed mood 08/12/2013    Augmentation: AROM, Pitocin and Cytotec Complications: None Intrapartum complications/course: She received 1 dose of misoprostol, had SROM, and recived with pitocin. Epidural placed for pain relief. She progressed to complete and had a spontaneous vaginal birth of a live female over an intact perineum under epidural anesthesia. The fetal head was delivered at 3:29pm with restitution to LOT. Anterior then posterior shoulders delivered with minimal assistance. Baby girl Hannah Hancock was placed on mom's chest and attended to by transition RN. Cord clamped and cut when pulseless. Placenta delivered spontaneously and intact with 3 cord vessels. IV pitocin given for  hemorrhage prophylaxis. 3rd degree repaired with the assistance of Drs. Twania Bujak and Dalbert GarnetBeasley. EBL 400 mL.  Delivery Type: spontaneous vaginal delivery Anesthesia: epidural Placenta: sponatneous Laceration: 3rd degree, repaired Episiotomy: none  Newborn Data: Live born female  Birth Weight: 8lb 3.2oz  APGAR: 8, 9  Newborn Delivery   Birth date/time:  10/04/2017 15:29:00 Delivery type:  Vaginal, Spontaneous      Postpartum Course  Patient had an uncomplicated postpartum course.  By time of discharge on PPD#2, her pain was controlled on oral pain medications; she had appropriate lochia and was ambulating, voiding without difficulty and tolerating regular diet.  She was deemed stable for discharge to home.       Labs: CBC Latest Ref Rng & Units 10/05/2017 10/03/2017 06/28/2017  WBC 3.6 - 11.0 K/uL 16.4(H) 10.3 17.5(H)  Hemoglobin 12.0 - 16.0 g/dL 2.9(F9.2(L) 10.1(L) 10.6(L)  Hematocrit 35.0 - 47.0 % 28.9(L) 31.6(L) 31.3(L)  Platelets 150 - 440 K/uL 186 176 256   B POS  Physical exam:  BP 132/78 (BP Location: Left Arm)   Pulse 66   Temp 97.9 F (36.6 C) (Oral)   Resp 18   Ht 5\' 3"  (1.6 m)   Wt 88 kg (194 lb)   LMP 11/19/2016 (Approximate)   SpO2 98%   Breastfeeding? Unknown   BMI 34.37 kg/m  General: alert and no distress Pulm: normal respiratory effort Lochia: appropriate Abdomen: soft, NT Uterine Fundus: firm, below umbilicus Extremities: No evidence of DVT seen on physical exam. No lower extremity edema.   Disposition: stable, discharge to home Baby Feeding: breastmilk Baby Disposition: home with mom  Contraception: POPs  Prenatal Labs:  Blood type/Rh B Pos  Antibody screen neg  Rubella Immune  Varicella Immune  RPR NR  HBsAg Neg  HIV NR  GC neg  Chlamydia  neg  Genetic screening 2nd tri screen negative  1 hour GTT 99  GBS Positive     Rh Immune globulin given: n/a Rubella vaccine given: n/a Tdap vaccine given in AP or PP setting: 05/10/17 Flu vaccine  given in AP or PP setting: 07/11/17  Plan:  Hannah Hancock was discharged to home in good condition. Follow-up appointment at [facility] with delivery provider in 4-6 weeks  Discharge Instructions: Per After Visit Summary. Activity: Advance as tolerated. Pelvic rest for 6 weeks.   Diet: Regular Discharge Medications: Allergies as of 10/06/2017   No Known Allergies     Medication List    STOP taking these medications   cephALEXin 500 MG capsule Commonly known as:  KEFLEX   clindamycin-benzoyl peroxide gel Commonly known as:  BENZACLIN   cyclobenzaprine 10 MG tablet Commonly known as:  FLEXERIL   diclofenac 50 MG EC tablet Commonly known as:  VOLTAREN   EPIDUO 0.1-2.5 % gel Generic drug:  Adapalene-Benzoyl Peroxide   metoCLOPramide 10 MG tablet Commonly known as:  REGLAN   sertraline 25 MG tablet Commonly known as:  ZOLOFT     TAKE these medications   multivitamin tablet Take 1 tablet by mouth daily.   senna-docusate 8.6-50 MG tablet Commonly known as:  Senokot-S Take 2 tablets by mouth daily.      Outpatient follow up:  Follow-up Information    Genia Del, CNM. Schedule an appointment as soon as possible for a visit in 6 week(s).   Specialty:  Certified Nurse Midwife Why:  For routine postpartum visit.  Contact information: [address] [name] [address] [phone]            Signed:  Elenora Fender Nettye Flegal  10/06/2017 1:10 PM  The med rec was not able to continue unless I chose an action on the medications above that were listed as 'stop taking"  Patient reported that she was not taking any of them, some of which were recorded on the med file from 2015.  I was not in care of this patient in 2015, 2016, 2017. Our clinic cared for her through her pregnancy from 2018-2019.  I do not assume responsibility for the discontinuation of these medications, but it is through a computer glitch that I cannot discharge the patient  without commenting on these medications.  I asked our IT department to help me rectify this situation and was told there was no way to work around without me commenting and discontinuing these medications.  -CW

## 2017-10-04 NOTE — Progress Notes (Signed)
Labor Progress Note  Hannah Hancock is a 22 y.o. G1P0 at 6035w2d by LMP c/w 5887w3d u/s admitted for induction of labor due to pre-eclampsia without severe features.  Subjective:  Patient resting in bed. Reports being very tired. Comfortable with epidural. Mom and boyfriend at the bedside for support. Reports no headache, visual changes, N/V, chest pain, shortness of breath, or RUQ pain.  Objective: BP 124/71   Pulse 71   Temp 99.4 F (37.4 C) (Axillary)   Resp 20   Ht 5\' 3"  (1.6 m)   Wt 88 kg (194 lb)   LMP 11/19/2016 (Approximate)   SpO2 95%   BMI 34.37 kg/m  Notable VS details: normal to mild range BPs.  Fetal Assessment: FHT:  FHR: 135 bpm, variability: moderate,  Accelerations: 15x15 present,  decelerations:  Absent Category/reactivity:  Category I UC:   regular, every 2-4 minutes SVE:    Dilation: 9cm  Effacement: 90%  Station:  -1  Consistency: soft  Position: anterior  Membrane status: SROM clear fluid 10/03/17 1945, AROM forebag 10/04/17 0815 Amniotic color: clear  Labs: Lab Results  Component Value Date   WBC 10.3 10/03/2017   HGB 10.1 (L) 10/03/2017   HCT 31.6 (L) 10/03/2017   MCV 75.4 (L) 10/03/2017   PLT 176 10/03/2017    Assessment / Plan: Induction of labor due to preeclampsia,  progressing well on pitocin  Labor: Progressing normally, AROM performed of bulging forebag for clear fluid Preeclampsia:  labs WNL, no s/s of severe features Fetal Wellbeing:  Category I Pain Control:  Epidural GBS prophylaxis: administered as ordered Anticipated MOD:  NSVD  Hannah Hancock, CNM 10/04/2017, 9:12 AM

## 2017-10-05 LAB — CBC
HCT: 28.9 % — ABNORMAL LOW (ref 35.0–47.0)
Hemoglobin: 9.2 g/dL — ABNORMAL LOW (ref 12.0–16.0)
MCH: 23.6 pg — ABNORMAL LOW (ref 26.0–34.0)
MCHC: 31.7 g/dL — ABNORMAL LOW (ref 32.0–36.0)
MCV: 74.3 fL — ABNORMAL LOW (ref 80.0–100.0)
Platelets: 186 K/uL (ref 150–440)
RBC: 3.88 MIL/uL (ref 3.80–5.20)
RDW: 20.7 % — ABNORMAL HIGH (ref 11.5–14.5)
WBC: 16.4 K/uL — ABNORMAL HIGH (ref 3.6–11.0)

## 2017-10-05 LAB — COMPREHENSIVE METABOLIC PANEL WITH GFR
ALT: 11 U/L — ABNORMAL LOW (ref 14–54)
AST: 29 U/L (ref 15–41)
Albumin: 2.2 g/dL — ABNORMAL LOW (ref 3.5–5.0)
Alkaline Phosphatase: 124 U/L (ref 38–126)
Anion gap: 9 (ref 5–15)
BUN: 17 mg/dL (ref 6–20)
CO2: 21 mmol/L — ABNORMAL LOW (ref 22–32)
Calcium: 8.6 mg/dL — ABNORMAL LOW (ref 8.9–10.3)
Chloride: 107 mmol/L (ref 101–111)
Creatinine, Ser: 1.12 mg/dL — ABNORMAL HIGH (ref 0.44–1.00)
GFR calc Af Amer: >60 mL/min
GFR calc non Af Amer: >60 mL/min
Glucose, Bld: 79 mg/dL (ref 65–99)
Potassium: 4.5 mmol/L (ref 3.5–5.1)
Sodium: 137 mmol/L (ref 135–145)
Total Bilirubin: 0.5 mg/dL (ref 0.3–1.2)
Total Protein: 5.2 g/dL — ABNORMAL LOW (ref 6.5–8.1)

## 2017-10-05 NOTE — Anesthesia Postprocedure Evaluation (Signed)
Anesthesia Post Note  Patient: Modesta MessingChasity D Canupp  Procedure(s) Performed: AN AD HOC LABOR EPIDURAL  Patient location during evaluation: [facility] Anesthesia Type: Epidural Level of consciousness: awake, awake and alert and oriented Pain management: pain level controlled Vital Signs Assessment: post-procedure vital signs reviewed and stable Respiratory status: spontaneous breathing, nonlabored ventilation and respiratory function stable Cardiovascular status: blood pressure returned to baseline and stable Postop Assessment: no headache, no backache, patient able to bend at knees and no apparent nausea or vomiting Anesthetic complications: no     Last Vitals:  Vitals:   10/05/17 0030 10/05/17 0404  BP: 134/80 109/62  Pulse: 70 80  Resp: 18 16  Temp: 36.9 C 36.6 C  SpO2: 97% 97%    Last Pain:  Vitals:   10/05/17 0648  TempSrc:   PainSc: 3                  Chiropodisttephanie Jaria Conway

## 2017-10-05 NOTE — Progress Notes (Signed)
Post Partum Day 1 Subjective: no complaints 3 rd degree laceration Objective: Blood pressure 109/62, pulse 80, temperature 97.9 F (36.6 C), temperature source Oral, resp. rate 16, height 5\' 3"  (1.6 m), weight 88 kg (194 lb), last menstrual period 11/19/2016, SpO2 97 %, unknown if currently breastfeeding.  Physical Exam:  General: alert and cooperative Lochia: appropriate Uterine Fundus: firm DVT Evaluation: No evidence of DVT seen on physical exam.   Recent Labs    10/03/17 1513 10/05/17 0631  HGB 10.1* 9.2*  HCT 31.6* 28.9*    Assessment/Plan: Plan for discharge tomorrow Po meds for pain relief   BP stable   LOS: 2 days   Ihor Austinhomas J Schermerhorn 10/05/2017, 7:19 AM

## 2017-10-05 NOTE — Lactation Note (Signed)
This note was copied from a baby's chart. Lactation Consultation Note  Patient Name: Hannah Hancock UJWJX'BToday's Date: 10/05/2017 Reason for consult: Follow-up assessment;Primapara   Maternal Data Formula Feeding for Exclusion: No Has patient been taught Hand Expression?: Yes Does the patient have breastfeeding experience prior to this delivery?: No  Feeding Feeding Type: Breast Fed Length of feed: 40 min(20 min right , 20 min left) Lots of swallows LATCH Score Latch: Grasps breast easily, tongue down, lips flanged, rhythmical sucking.  Audible Swallowing: Spontaneous and intermittent  Type of Nipple: Inverted(left nipples inverted, right sl flat but baby can latch)  Comfort (Breast/Nipple): Filling, red/small blisters or bruises, mild/mod discomfort  Hold (Positioning): Assistance needed to correctly position infant at breast and maintain latch.  LATCH Score: 6  Interventions Interventions: Assisted with latch;Hand express;Adjust position;Support pillows;Position options Given Medela nipple shield instruction sheet  Lactation Tools Discussed/Used Tools: Nipple Shields(on left breast) Nipple shield size: 24 WIC Program: No(wants to sign up in [place]   Consult Status Consult Status: PRN Date: 10/05/17 Follow-up type: In-patient    Dyann KiefMarsha D Kewanda Poland 10/05/2017, 11:44 AM

## 2017-10-06 DIAGNOSIS — O09299 Supervision of pregnancy with other poor reproductive or obstetric history, unspecified trimester: Secondary | ICD-10-CM | POA: Insufficient documentation

## 2017-10-06 MED ORDER — SENNOSIDES-DOCUSATE SODIUM 8.6-50 MG PO TABS: 2.0000 | ORAL_TABLET | ORAL | 1 refills | Status: DC

## 2017-10-06 NOTE — Discharge Instructions (Signed)
Care of a Perineal Tear °A perineal tear is a cut (laceration) in the tissue between the opening of the vagina and the anus (perineum). Some women naturally develop a perineal tear during a vaginal birth. This can happen as the baby emerges from the birth canal and the perineum is stretched. Perineal tears are graded based on how deep and long the laceration is. The grading for perineal tears is as follows: °· First degree. This involves a shallow tear at the edge of the vaginal opening that extends slightly into the perineal skin. °· Second degree. This involves tearing described in a first degree perineal tear and also a deeper tear of the vaginal opening and perineal tissues. It may also include tearing of a muscle just under the perineal skin. °· Third degree. This involves tearing described in a first and second degree perineal tear, with the tear extending into the muscle of the anus (anal sphincter). °· Fourth degree. This involves all levels of tear described for first, second, and third degree perineal tear, with the tear extending into the rectum. ° °First degree perineal tears may or may not be stitched closed, depending on their location and appearance. Second, third, and fourth degree perineal tears are stitched closed immediately after the baby’s birth. °What are the risks? °Depending on the type of perineal tear you have, you may be at risk for the following: °· Bleeding. °· Developing a collection of blood in the perineal tear area (hematoma). °· Pain. This may include pain with urination or bowel movements. °· Infection at the site of the tear. °· Fever. °· Trouble controlling your bowels (fecal incontinence). °· Painful sexual intercourse. ° °How to care for a perineal tear °· The first day, put ice on the area of the tear. °? Put ice in a plastic bag. °? Place a towel between your skin and the bag. °? Leave the ice on for 20 minutes, 2-3 times a day. °· Bathe using a warm sitz bath as directed by  your health care provider. This can speed up healing. Sitz baths can be performed in your bathtub or using a sitz bath kit that fits over your toilet. °? Place 3-4 in. (7.6-10 cm) of warm water in your bathtub or fill the sitz bath over-the-toilet container with warm water. Make sure the water is not too hot by placing a drop on your wrist. °? Sit in the warm water for 20-30 minutes. °? After bathing, pat your perineum dry with a clean towel. Do not scrub the perineum as this could cause pain, irritation, or open any stitches you may have. °? Keep the over-the-toilet sitz bath container clean by rinsing it thoroughly after each use. Ask for help in keeping the bathtub clean with diluted bleach and water (2 Tbsp [30 mL] of bleach to ½ gal [1.9 L] of water). °? Repeat the sitz bath as often as you would like to relieve perineal pain, itching, or discomfort. °· Apply a numbing spray to the perineal tear site as directed by your health care provider. This may help with discomfort. °· Wash your hands before and after applying medicine to the area. °· Put about 3 witch hazel-containing hemorrhoid treatment pads on top of your sanitary pad. The witch hazel in the hemorrhoid pads helps with discomfort and swelling. °· Get a squeeze bottle to squeeze warm water on your perineum when urinating, spraying the area from front to back. Pat the area to dry it. °· Sitting on an inflatable   ring or pillow may provide comfort. °· Take medicines only as directed by your health care provider. °· Do not have sexual intercourse or use tampons until your health care provider says it is okay. Typically, you must wait at least 6 weeks. °· Keep all postpartum appointments as directed by your health care provider. °Contact a health care provider if: °· Your pain is not relieved with medicines. °· You have painful urination. °· You have a fever. °Get help right away if: °· You have redness, swelling, or increasing pain in the area of the  tear. °· You have pus coming from the area of the tear. °· You notice a bad smell coming from the area of the tear. °· Your tear opens. °· You notice swelling in the area of the tear that is larger than when you left the hospital. °· You cannot urinate. °This information is not intended to replace advice given to you by your health care provider. Make sure you discuss any questions you have with your health care provider. °Document Released: 12/22/2013 Document Revised: 01/19/2016 Document Reviewed: 05/13/2013 °Elsevier Interactive Patient Education © 2017 [employer] ° °

## 2017-10-06 NOTE — Progress Notes (Signed)
Reviewed all patients discharge instructions and handouts regarding postpartum bleeding, no intercourse for 6 weeks, signs and symptoms of mastitis and postpartum blue's. Reviewed discharge instructions for newborn regarding proper cord care, how and when to bathe the newborn, nail care, proper way to take the baby's temperature, along with safe sleep. All questions have been answered at this time. Patient to be discharged via wheelchair with axillary.

## 2017-10-23 ENCOUNTER — Encounter: Payer: Self-pay | Admitting: Certified Nurse Midwife

## 2017-11-01 ENCOUNTER — Inpatient Hospital Stay
Admission: EM | Admit: 2017-11-01 | Discharge: 2017-11-04 | DRG: 419 | Disposition: A | Discharge status: Home / Self Care | Payer: BLUE CROSS/BLUE SHIELD | Attending: Internal Medicine | Admitting: Internal Medicine

## 2017-11-01 ENCOUNTER — Other Ambulatory Visit: Payer: Self-pay

## 2017-11-01 ENCOUNTER — Emergency Department: Payer: BLUE CROSS/BLUE SHIELD

## 2017-11-01 ENCOUNTER — Encounter: Payer: Self-pay | Admitting: Emergency Medicine

## 2017-11-01 ENCOUNTER — Emergency Department
Admission: EM | Admit: 2017-11-01 | Discharge: 2017-11-01 | Disposition: A | Discharge status: Home / Self Care | Payer: BLUE CROSS/BLUE SHIELD | Source: Home / Self Care | Attending: Emergency Medicine | Admitting: Emergency Medicine

## 2017-11-01 DIAGNOSIS — K8061 Calculus of gallbladder and bile duct with cholecystitis, unspecified, with obstruction: Secondary | ICD-10-CM | POA: Diagnosis present

## 2017-11-01 DIAGNOSIS — R112 Nausea with vomiting, unspecified: Secondary | ICD-10-CM

## 2017-11-01 DIAGNOSIS — Z79899 Other long term (current) drug therapy: Secondary | ICD-10-CM

## 2017-11-01 DIAGNOSIS — F900 Attention-deficit hyperactivity disorder, predominantly inattentive type: Secondary | ICD-10-CM | POA: Diagnosis present

## 2017-11-01 DIAGNOSIS — K8042 Calculus of bile duct with acute cholecystitis without obstruction: Secondary | ICD-10-CM | POA: Diagnosis not present

## 2017-11-01 DIAGNOSIS — R945 Abnormal results of liver function studies: Secondary | ICD-10-CM

## 2017-11-01 DIAGNOSIS — R7989 Other specified abnormal findings of blood chemistry: Secondary | ICD-10-CM

## 2017-11-01 DIAGNOSIS — R740 Nonspecific elevation of levels of transaminase and lactic acid dehydrogenase [LDH]: Secondary | ICD-10-CM | POA: Diagnosis not present

## 2017-11-01 DIAGNOSIS — K2900 Acute gastritis without bleeding: Secondary | ICD-10-CM | POA: Insufficient documentation

## 2017-11-01 DIAGNOSIS — F53 Postpartum depression: Secondary | ICD-10-CM | POA: Diagnosis present

## 2017-11-01 DIAGNOSIS — R1013 Epigastric pain: Secondary | ICD-10-CM | POA: Insufficient documentation

## 2017-11-01 DIAGNOSIS — J45909 Unspecified asthma, uncomplicated: Secondary | ICD-10-CM | POA: Insufficient documentation

## 2017-11-01 DIAGNOSIS — R932 Abnormal findings on diagnostic imaging of liver and biliary tract: Secondary | ICD-10-CM

## 2017-11-01 DIAGNOSIS — K219 Gastro-esophageal reflux disease without esophagitis: Secondary | ICD-10-CM | POA: Diagnosis present

## 2017-11-01 DIAGNOSIS — R1011 Right upper quadrant pain: Secondary | ICD-10-CM

## 2017-11-01 DIAGNOSIS — K8050 Calculus of bile duct without cholangitis or cholecystitis without obstruction: Secondary | ICD-10-CM | POA: Diagnosis not present

## 2017-11-01 DIAGNOSIS — Z87891 Personal history of nicotine dependence: Secondary | ICD-10-CM | POA: Insufficient documentation

## 2017-11-01 DIAGNOSIS — R7401 Elevation of levels of liver transaminase levels: Secondary | ICD-10-CM | POA: Diagnosis present

## 2017-11-01 DIAGNOSIS — R1010 Upper abdominal pain, unspecified: Secondary | ICD-10-CM

## 2017-11-01 LAB — COMPREHENSIVE METABOLIC PANEL
ALBUMIN: 4.1 g/dL (ref 3.5–5.0)
ALK PHOS: 126 U/L (ref 38–126)
ALK PHOS: 243 U/L — AB (ref 38–126)
ALT: 17 U/L (ref 14–54)
ALT: 250 U/L — AB (ref 14–54)
ANION GAP: 11 (ref 5–15)
ANION GAP: 10 (ref 5–15)
AST: 33 U/L (ref 15–41)
AST: 400 U/L — ABNORMAL HIGH (ref 15–41)
Albumin: 4.2 g/dL (ref 3.5–5.0)
BILIRUBIN TOTAL: 0.7 mg/dL (ref 0.3–1.2)
BUN: 22 mg/dL — ABNORMAL HIGH (ref 6–20)
BUN: 16 mg/dL (ref 6–20)
CALCIUM: 9.6 mg/dL (ref 8.9–10.3)
CALCIUM: 9.5 mg/dL (ref 8.9–10.3)
CO2: 23 mmol/L (ref 22–32)
CO2: 22 mmol/L (ref 22–32)
CREATININE: 0.68 mg/dL (ref 0.44–1.00)
Chloride: 107 mmol/L (ref 101–111)
Chloride: 106 mmol/L (ref 101–111)
Creatinine, Ser: 0.78 mg/dL (ref 0.44–1.00)
GFR calc non Af Amer: >60 mL/min (ref >60)
Glucose, Bld: 120 mg/dL — ABNORMAL HIGH (ref 65–99)
Glucose, Bld: 110 mg/dL — ABNORMAL HIGH (ref 65–99)
Potassium: 3.6 mmol/L (ref 3.5–5.1)
Potassium: 4.2 mmol/L (ref 3.5–5.1)
SODIUM: 141 mmol/L (ref 135–145)
SODIUM: 138 mmol/L (ref 135–145)
TOTAL PROTEIN: 7.9 g/dL (ref 6.5–8.1)
Total Bilirubin: 1.6 mg/dL — ABNORMAL HIGH (ref 0.3–1.2)
Total Protein: 7.7 g/dL (ref 6.5–8.1)

## 2017-11-01 LAB — CBC WITH DIFFERENTIAL/PLATELET
BASOS ABS: 0 10*3/uL (ref 0–0.1)
BASOS PCT: 0 %
EOS ABS: 0 10*3/uL (ref 0–0.7)
Eosinophils Relative: 1 %
HCT: 36.4 % (ref 35.0–47.0)
HEMOGLOBIN: 11.9 g/dL — AB (ref 12.0–16.0)
Lymphocytes Relative: 20 %
Lymphs Abs: 1.9 10*3/uL (ref 1.0–3.6)
MCH: 25.1 pg — AB (ref 26.0–34.0)
MCHC: 32.8 g/dL (ref 32.0–36.0)
MCV: 76.6 fL — ABNORMAL LOW (ref 80.0–100.0)
Monocytes Absolute: 0.6 10*3/uL (ref 0.2–0.9)
Monocytes Relative: 6 %
NEUTROS PCT: 73 %
Neutro Abs: 6.7 10*3/uL — ABNORMAL HIGH (ref 1.4–6.5)
Platelets: 300 10*3/uL (ref 150–440)
RBC: 4.75 MIL/uL (ref 3.80–5.20)
RDW: 19.5 % — ABNORMAL HIGH (ref 11.5–14.5)
WBC: 9.2 10*3/uL (ref 3.6–11.0)

## 2017-11-01 LAB — CBC
HCT: 37.5 % (ref 35.0–47.0)
Hemoglobin: 12.2 g/dL (ref 12.0–16.0)
MCH: 25.3 pg — ABNORMAL LOW (ref 26.0–34.0)
MCHC: 32.5 g/dL (ref 32.0–36.0)
MCV: 77.7 fL — ABNORMAL LOW (ref 80.0–100.0)
PLATELETS: 289 10*3/uL (ref 150–440)
RBC: 4.82 MIL/uL (ref 3.80–5.20)
RDW: 19.5 % — ABNORMAL HIGH (ref 11.5–14.5)
WBC: 10.9 10*3/uL (ref 3.6–11.0)

## 2017-11-01 LAB — TROPONIN I

## 2017-11-01 LAB — HCG, QUANTITATIVE, PREGNANCY: HCG, BETA CHAIN, QUANT, S: 2 m[IU]/mL (ref <5)

## 2017-11-01 LAB — FIBRIN DERIVATIVES D-DIMER (ARMC ONLY): FIBRIN DERIVATIVES D-DIMER (ARMC): 664.28 ng{FEU}/mL — AB (ref 0.00–499.00)

## 2017-11-01 LAB — LIPASE, BLOOD
Lipase: 40 U/L (ref 11–51)
Lipase: 41 U/L (ref 11–51)

## 2017-11-01 MED ORDER — POLYETHYLENE GLYCOL 3350 17 G PO PACK: 17.0000 g | PACK | Freq: Every day | ORAL | Status: DC | PRN

## 2017-11-01 MED ORDER — ACETAMINOPHEN 500 MG PO TABS
1000.0000 mg | ORAL_TABLET | Freq: Once | ORAL | Status: AC
  Administered 2017-11-01: 1000 mg via ORAL

## 2017-11-01 MED ORDER — ONDANSETRON HCL 4 MG/2ML IJ SOLN
4.0000 mg | Freq: Once | INTRAMUSCULAR | Status: AC
  Administered 2017-11-01: 4 mg via INTRAVENOUS
  Filled 2017-11-01: qty 2

## 2017-11-01 MED ORDER — IOPAMIDOL (ISOVUE-300) INJECTION 61%
75.0000 mL | Freq: Once | INTRAVENOUS | Status: AC | PRN
  Administered 2017-11-01: 75 mL via INTRAVENOUS
  Filled 2017-11-01: qty 75

## 2017-11-01 MED ORDER — FENTANYL CITRATE (PF) 100 MCG/2ML IJ SOLN
INTRAMUSCULAR | Status: AC
  Administered 2017-11-01: 50 ug via INTRAVENOUS
  Filled 2017-11-01: qty 2

## 2017-11-01 MED ORDER — GI COCKTAIL ~~LOC~~
ORAL | Status: AC
  Administered 2017-11-01: 30 mL via ORAL
  Filled 2017-11-01: qty 30

## 2017-11-01 MED ORDER — SODIUM CHLORIDE 0.9 % IV BOLUS (SEPSIS)
1000.0000 mL | Freq: Once | INTRAVENOUS | Status: AC
  Administered 2017-11-01: 1000 mL via INTRAVENOUS

## 2017-11-01 MED ORDER — IOPAMIDOL (ISOVUE-370) INJECTION 76%
75.0000 mL | Freq: Once | INTRAVENOUS | Status: AC | PRN
  Administered 2017-11-01: 75 mL via INTRAVENOUS

## 2017-11-01 MED ORDER — ONDANSETRON HCL 4 MG/2ML IJ SOLN
INTRAMUSCULAR | Status: AC
  Filled 2017-11-01: qty 2

## 2017-11-01 MED ORDER — ACETAMINOPHEN 650 MG RE SUPP
650.0000 mg | Freq: Four times a day (QID) | RECTAL | Status: DC | PRN
  Filled 2017-11-01: qty 1

## 2017-11-01 MED ORDER — ALUM & MAG HYDROXIDE-SIMETH 400-400-40 MG/5ML PO SUSP: 5.0000 mL | Freq: Four times a day (QID) | ORAL | 0 refills | Status: DC | PRN

## 2017-11-01 MED ORDER — FENTANYL CITRATE (PF) 100 MCG/2ML IJ SOLN
50.0000 ug | Freq: Once | INTRAMUSCULAR | Status: AC
  Administered 2017-11-01: 50 ug via INTRAVENOUS

## 2017-11-01 MED ORDER — ONDANSETRON 4 MG PO TBDP: 4.0000 mg | ORAL_TABLET | Freq: Three times a day (TID) | ORAL | 0 refills | Status: DC | PRN

## 2017-11-01 MED ORDER — ONDANSETRON HCL 4 MG/2ML IJ SOLN
4.0000 mg | Freq: Once | INTRAMUSCULAR | Status: AC
  Administered 2017-11-01: 4 mg via INTRAVENOUS

## 2017-11-01 MED ORDER — FAMOTIDINE IN NACL 20-0.9 MG/50ML-% IV SOLN
20.0000 mg | Freq: Once | INTRAVENOUS | Status: AC
  Administered 2017-11-01: 20 mg via INTRAVENOUS
  Filled 2017-11-01: qty 50

## 2017-11-01 MED ORDER — FAMOTIDINE 20 MG PO TABS: 20.0000 mg | ORAL_TABLET | Freq: Two times a day (BID) | ORAL | 1 refills | Status: DC

## 2017-11-01 MED ORDER — ONDANSETRON HCL 4 MG PO TABS
4.0000 mg | ORAL_TABLET | Freq: Four times a day (QID) | ORAL | Status: DC | PRN
  Filled 2017-11-01 (×2): qty 1

## 2017-11-01 MED ORDER — PROMETHAZINE HCL 25 MG/ML IJ SOLN
INTRAMUSCULAR | Status: AC
  Filled 2017-11-01: qty 1

## 2017-11-01 MED ORDER — MORPHINE SULFATE (PF) 4 MG/ML IV SOLN
4.0000 mg | Freq: Once | INTRAVENOUS | Status: AC
  Administered 2017-11-01: 4 mg via INTRAVENOUS
  Filled 2017-11-01: qty 1

## 2017-11-01 MED ORDER — GI COCKTAIL ~~LOC~~
30.0000 mL | Freq: Once | ORAL | Status: AC
  Administered 2017-11-01: 30 mL via ORAL

## 2017-11-01 MED ORDER — PROMETHAZINE HCL 25 MG/ML IJ SOLN
12.5000 mg | Freq: Once | INTRAMUSCULAR | Status: AC
  Administered 2017-11-01: 12.5 mg via INTRAVENOUS

## 2017-11-01 MED ORDER — ONDANSETRON HCL 4 MG/2ML IJ SOLN
4.0000 mg | Freq: Once | INTRAMUSCULAR | Status: AC | PRN
  Administered 2017-11-01: 4 mg via INTRAVENOUS
  Filled 2017-11-01: qty 2

## 2017-11-01 MED ORDER — SERTRALINE HCL 50 MG PO TABS
25.0000 mg | ORAL_TABLET | Freq: Every day | ORAL | Status: DC
  Administered 2017-11-03 – 2017-11-04 (×2): 25 mg via ORAL
  Filled 2017-11-01 (×2): qty 1

## 2017-11-01 MED ORDER — MORPHINE SULFATE (PF) 2 MG/ML IV SOLN: 2.0000 mg | INTRAVENOUS | Status: DC | PRN

## 2017-11-01 MED ORDER — HYDROCODONE-ACETAMINOPHEN 7.5-325 MG PO TABS
1.0000 | ORAL_TABLET | Freq: Four times a day (QID) | ORAL | Status: DC | PRN
  Administered 2017-11-03 – 2017-11-04 (×2): 1 via ORAL
  Filled 2017-11-01 (×2): qty 1

## 2017-11-01 MED ORDER — ACETAMINOPHEN 500 MG PO TABS
ORAL_TABLET | ORAL | Status: AC
  Filled 2017-11-01: qty 2

## 2017-11-01 MED ORDER — ACETAMINOPHEN 325 MG PO TABS: 650.0000 mg | ORAL_TABLET | Freq: Four times a day (QID) | ORAL | Status: DC | PRN

## 2017-11-01 MED ORDER — FENTANYL CITRATE (PF) 100 MCG/2ML IJ SOLN
INTRAMUSCULAR | Status: AC
  Filled 2017-11-01: qty 2

## 2017-11-01 MED ORDER — ONDANSETRON HCL 4 MG/2ML IJ SOLN
4.0000 mg | Freq: Four times a day (QID) | INTRAMUSCULAR | Status: DC | PRN
  Administered 2017-11-03: 4 mg via INTRAVENOUS

## 2017-11-01 NOTE — ED Provider Notes (Signed)
[facility] Emergency Department Provider Note  ____________________________________________   First MD Initiated Contact with Patient 11/01/17 1734     (approximate)  I have reviewed the triage vital signs and the nursing notes.   HISTORY  Chief Complaint Abdominal Pain   HPI Hannah Hancock is a 22 y.o. female who is 4 weeks postpartum who has been diagnosed with gallstones was presented to the emergency department with upper abdominal pain as well as right upper quadrant pain.  She states that she was evaluated this morning in the emergency department and told she had a peptic ulcer.  At that time she had a CAT scan with angiography of her chest to rule out a PE.  Also repeat right upper quadrant ultrasound which was negative for gallstones.  She reports her pain is an 8 out of 10 and sharp at this time.  She denies any radiation.  Says that she has been nauseous and has vomited once.  Denies any diarrhea.  Denies any vaginal bleeding or discharge.  Does not report any fever or chills.  She is returning at this time because her pain is returned and is much worse than it was earlier today.  Past Medical History:  Diagnosis Date  . Abdominal pain, recurrent   . 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 Anxiety   . Asthma    no meds, no recent attacks  . Gallstones   . Tendinitis    R hip  . Urinary tract infection    Last one 4 months ago  . Weight loss     Patient Active Problem List   Diagnosis Date Noted  . Third degree laceration of perineum during delivery, postpartum 10/06/2017  . Gestational hypertension 10/03/2017  . Abdominal pain affecting pregnancy 06/28/2017  . 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 Presence of subdermal contraceptive device 04/07/2014  . Sleep disturbance 11/03/2013  . Adjustment disorder with mixed anxiety and depressed mood 08/12/2013    Past Surgical History:  Procedure Laterality Date  .  ADENOIDECTOMY    . ESOPHAGOGASTRODUODENOSCOPY  10/20/2011   Procedure: ESOPHAGOGASTRODUODENOSCOPY (EGD);  Surgeon: Jon GillsJoseph H. Clark, MD;  [facility];  Service: Gastroenterology;  Laterality: N/A;    Prior to Admission medications   Medication Sig Start Date End Date Taking? Authorizing Provider  alum & mag hydroxide-simeth (MAALOX MAX) 400-400-40 MG/5ML suspension Take 5 mLs by mouth every 6 (six) hours as needed for indigestion. 11/01/17   Nita SickleVeronese, [place], MD  famotidine (PEPCID) 20 MG tablet Take 1 tablet (20 mg total) by mouth 2 (two) times daily. 11/01/17 11/01/18  Nita SickleVeronese, [place], MD  ibuprofen (ADVIL,MOTRIN) 200 MG tablet Take 200-400 mg by mouth every 6 (six) hours as needed.    [provider]  Multiple Vitamin (MULTIVITAMIN) tablet Take 1 tablet by mouth daily.    [provider]  ondansetron (ZOFRAN ODT) 4 MG disintegrating tablet Take 1 tablet (4 mg total) by mouth every 8 (eight) hours as needed for nausea or vomiting. 11/01/17   Don PerkingVeronese, [place], MD  polyethylene glycol [facility](MIRALAX / Ethelene HalGLYCOLAX) packet Take 17 g by mouth daily as needed.    [provider]  senna-docusate (SENOKOT-S) 8.6-50 MG tablet Take 2 tablets by mouth daily. Patient not taking: Reported on 11/01/2017 10/06/17   [name], Elenora Fenderhelsea C, MD  sertraline (ZOLOFT) 25 MG tablet Take 25 mg by mouth daily. 10/24/17   [provider]    Allergies Patient has no known allergies.  Family History  Problem Relation Age of Onset  . Cholelithiasis Mother   . Miscarriages / [place] Mother   . Hypertension Maternal Grandmother   . Kidney disease Maternal Grandmother   . Stroke Maternal Grandmother   . Diabetes Paternal Grandfather   . Hypertension Paternal Grandfather     Social History Social History   Tobacco Use  . Smoking status: Former Smoker    Packs/day: 0.30    Years: 1.00    Pack years: 0.30    Types: Cigarettes    Last attempt to quit: 05/29/2015    Years since  quitting: 2.4  . Smokeless tobacco: Never Used  Substance Use Topics  . Alcohol use: No    Alcohol/week: 0.0 oz  . Drug use: No    Review of Systems  Constitutional: No fever/chills Eyes: No visual changes. ENT: No sore throat. Cardiovascular: Denies chest pain. Respiratory: Denies shortness of breath. Gastrointestinal: no diarrhea.  No constipation. Genitourinary: Negative for dysuria. Musculoskeletal: Negative for back pain. Skin: Negative for rash. Neurological: Negative for headaches, focal weakness or numbness.   ____________________________________________   PHYSICAL EXAM:  VITAL SIGNS: ED Triage Vitals  Enc Vitals Group     BP 11/01/17 1711 115/75     Pulse Rate 11/01/17 1711 74     Resp 11/01/17 1711 18     Temp 11/01/17 1711 98 F (36.7 C)     Temp Source 11/01/17 1711 Oral     SpO2 11/01/17 1711 99 %     Weight 11/01/17 1712 155 lb (70.3 kg)     Height 11/01/17 1712 5\' 3"  (1.6 m)     Head Circumference --      Peak Flow --      Pain Score 11/01/17 1712 6     Pain Loc --      Pain Edu? --      Excl. in GC? --     Constitutional: Alert and oriented.  Patient appears uncomfortable and is holding her upper abdomen. Eyes: Conjunctivae are normal.  Head: Atraumatic. Nose: No congestion/rhinnorhea. Mouth/Throat: Mucous membranes are moist.  Neck: No stridor.   Cardiovascular: Normal rate, regular rhythm. Grossly normal heart sounds.   Respiratory: Normal respiratory effort.  No retractions. Lungs CTAB. Gastrointestinal: Soft with moderate to severe epigastric as well as right upper quadrant tenderness with a positive Murphy sign.  Also with mild lower abdominal tenderness across the lower abdomen without rebound or guarding.  No distention. No CVA tenderness. Musculoskeletal: No lower extremity tenderness nor edema.  No joint effusions. Neurologic:  Normal speech and language. No gross focal neurologic deficits are appreciated. Skin:  Skin is warm, dry and  intact. No rash noted. Psychiatric: Mood and affect are normal. Speech and behavior are normal.  ____________________________________________   LABS (all labs ordered are listed, but only abnormal results are displayed)  Labs Reviewed  CBC WITH DIFFERENTIAL/PLATELET - Abnormal; Notable for the following components:      Result Value   Hemoglobin 11.9 (*)    MCV 76.6 (*)    MCH 25.1 (*)    RDW 19.5 (*)    Neutro Abs 6.7 (*)    All other components within normal limits  COMPREHENSIVE METABOLIC PANEL - Abnormal; Notable for the following components:   Glucose, Bld 110 (*)    AST 400 (*)    ALT 250 (*)    Alkaline Phosphatase 243 (*)    Total Bilirubin 1.6 (*)    All other components within normal limits  LIPASE,  BLOOD  TROPONIN I  URINALYSIS, COMPLETE (UACMP) WITH MICROSCOPIC  POC URINE PREG, ED   ____________________________________________  EKG  ED ECG REPORT I, Arelia Longest, the attending physician, personally viewed and interpreted this ECG.   Date: 11/01/2017  EKG Time: 1806  Rate: 65  Rhythm: normal sinus rhythm  Axis: Normal  Intervals:none  ST&T Change: No ST segment elevation or depression.  No abnormal T wave inversion.  ____________________________________________  RADIOLOGY  CT abdomen with prominent uterus consistent with postpartum state.  Mild periportal edema question underlying parenchymal liver disease. ____________________________________________   PROCEDURES  Procedure(s) performed:   Procedures  Critical Care performed:   ____________________________________________   INITIAL IMPRESSION / ASSESSMENT AND PLAN / ED COURSE  Pertinent labs & imaging results that were available during my care of the patient were reviewed by me and considered in my medical decision making (see chart for details).  Differential diagnosis includes, but is not limited to, ovarian cyst, ovarian torsion, acute appendicitis, diverticulitis, urinary tract  infection/pyelonephritis, endometriosis, bowel obstruction, colitis, renal colic, gastroenteritis, hernia, fibroids, endometriosis, pregnancy related pain including ectopic pregnancy, etc. Differential diagnosis includes, but is not limited to, acute appendicitis, renal colic, testicular torsion, urinary tract infection/pyelonephritis, prostatitis,  epididymitis, diverticulitis, small bowel obstruction or ileus, colitis, abdominal aortic aneurysm, gastroenteritis, hernia, etc. As part of my medical decision making, I reviewed the following data within the electronic MEDICAL RECORD NUMBER Notes from prior ED visits  ----------------------------------------- 7:41 PM on 11/01/2017 -----------------------------------------  Patient at this time after morphine says that her pain is controlled.  However, requesting more nausea medicines as she says her nausea is returning.  Discussed case with Dr. Tobi Bastos of gastroenterology.  Says the patient will likely require an MRCP.  Dr. Daleen Squibb will be doing ERCP tomorrow per Dr. Tobi Bastos.  Patient to be admitted to the hospital.  Patient aware of diagnosis as well as treatment plan willing to comply.  Sign of the Dr. Elpidio Anis.  Suspect choledocholithiasis.  Patient says that she has not been taking any Tylenol recently.  Is not a frequent drinker of alcohol. ____________________________________________   FINAL CLINICAL IMPRESSION(S) / ED DIAGNOSES  Transaminitis.  Upper abdominal pain.  EMEA.    NEW MEDICATIONS STARTED DURING THIS VISIT:  New Prescriptions   No medications on file     Note:  This document was prepared using Dragon voice recognition software and may include unintentional dictation errors.     Myrna Blazer, MD 11/01/17 [phone]

## 2017-11-01 NOTE — ED Notes (Signed)
Patient transported to CT 

## 2017-11-01 NOTE — Discharge Instructions (Signed)
For pain take tylenol 1000mg  every 8 hours. Avoid all NSAIDs. Take pepcid once a day for 4 weeks. Take zofran as needed for nausea and vomiting. Take Maalox as needed for pain in your stomach. Follow up with GI if your symptoms do not resolve in a few days. Return to the ER for new or worsening abdominal pain, fever, or several episodes of vomiting or if you vomit blood.

## 2017-11-01 NOTE — ED Triage Notes (Signed)
Patient presents to the ED with epigastric/right upper quadrant pain since November, but much worse today.  Patient was seen in the ED this morning and discharged.  Patient appears pale.  Patient is approx. 4 weeks post-partum.  Patient states, "I've never had an episode this bad before."  Patient reports pain increased approx. 2 hours after she left the ED this morning.  Patient reports vomiting x 2 since leaving the ED.  Patient reports she took the nausea medication she had a prescription for but nausea was not relieved.  Patient's mother states patient had an US at [facility] that showed galstones although the US this morning did not show gallstones.  Mother and sister have history of gallstones.

## 2017-11-01 NOTE — ED Notes (Signed)
Patient transported to Ultrasound 

## 2017-11-01 NOTE — ED Notes (Signed)
Patient had large emesis. Admitting MD paged. Admitting MD ordered IV phenergan - phenergan administered.

## 2017-11-01 NOTE — H&P (Signed)
[facility] [facility] [place] [facility]   PATIENT NAME: Hannah Hancock    MR#:  [id number]  DATE OF BIRTH:  03/28/1996  DATE OF ADMISSION:  11/01/2017  PRIMARY CARE PHYSICIAN: Leotis ShamesSingh, Jasmine, MD   REQUESTING/REFERRING PHYSICIAN: Dr. Pershing ProudSchaevitz  CHIEF COMPLAINT:   Chief Complaint  Patient presents with  . Abdominal Pain    HISTORY OF PRESENT ILLNESS:  Hannah Hancock  is a 22 y.o. female with a known history of anxiety and 4 weeks postpartum presents to the emergency room for a second time today due to epigastric and right upper quadrant abdominal pain.  Patient was diagnosed with gallstones during her pregnancy but to avoid surgery was [facility] after pregnancy.  Today morning patient arrived at the emergency room due to abdominal pain.  Had ultrasound of the right upper quadrant with no gallstones.  Normal LFTs.  A d-dimer was elevated and had CT scan of the chest done which was normal.  Patient was sent home with pain medications and nausea medications.  She returned again in the evening due to worsening pain and vomiting.  Patient is found to have elevated AST ALT, alkaline phosphatase and bilirubin.  CT scan of the abdomen shows periportal edema.  No gallstones.  Patient is being admitted for possible choledocholithiasis. Case has been discussed with GI Dr. Tobi BastosAnna from the emergency room.  PAST MEDICAL HISTORY:   Past Medical History:  Diagnosis Date  . Abdominal pain, recurrent   . 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 Anxiety   . Asthma    no meds, no recent attacks  . Gallstones   . Tendinitis    R hip  . Urinary tract infection    Last one 4 months ago  . Weight loss     PAST SURGICAL HISTORY:   Past Surgical History:  Procedure Laterality Date  . ADENOIDECTOMY    . ESOPHAGOGASTRODUODENOSCOPY  10/20/2011   Procedure: ESOPHAGOGASTRODUODENOSCOPY (EGD);  Surgeon: Jon GillsJoseph H. Clark, MD;  [facility];   Service: Gastroenterology;  Laterality: N/A;    SOCIAL HISTORY:   Social History   Tobacco Use  . Smoking status: Former Smoker    Packs/day: 0.30    Years: 1.00    Pack years: 0.30    Types: Cigarettes    Last attempt to quit: 05/29/2015    Years since quitting: 2.4  . Smokeless tobacco: Never Used  Substance Use Topics  . Alcohol use: No    Alcohol/week: 0.0 oz    FAMILY HISTORY:   Family History  Problem Relation Age of Onset  . Cholelithiasis Mother   . Miscarriages / [place] Mother   . Hypertension Maternal Grandmother   . Kidney disease Maternal Grandmother   . Stroke Maternal Grandmother   . Diabetes Paternal Grandfather   . Hypertension Paternal Grandfather     DRUG ALLERGIES:  No Known Allergies  REVIEW OF SYSTEMS:   Review of Systems  Constitutional: Positive for malaise/fatigue. Negative for chills and fever.  HENT: Negative for sore throat.   Eyes: Negative for blurred vision, double vision and pain.  Respiratory: Negative for cough, hemoptysis, shortness of breath and wheezing.   Cardiovascular: Negative for chest pain, palpitations, orthopnea and leg swelling.  Gastrointestinal: Positive for abdominal pain, nausea and vomiting. Negative for constipation, diarrhea and heartburn.  Genitourinary: Negative for dysuria and hematuria.  Musculoskeletal: Negative for back pain and joint pain.  Skin: Negative for rash.  Neurological:  Positive for weakness. Negative for sensory change, speech change, focal weakness and headaches.  Endo/Heme/Allergies: Does not bruise/bleed easily.  Psychiatric/Behavioral: Negative for depression. The patient is not nervous/anxious.     MEDICATIONS AT HOME:   Prior to Admission medications   Medication Sig Start Date End Date Taking? Authorizing Provider  famotidine (PEPCID) 20 MG tablet Take 1 tablet (20 mg total) by mouth 2 (two) times daily. 11/01/17 11/01/18 Yes [name], [place], MD  Multiple Vitamin (MULTIVITAMIN)  tablet Take 1 tablet by mouth daily.   Yes [provider]  ondansetron (ZOFRAN ODT) 4 MG disintegrating tablet Take 1 tablet (4 mg total) by mouth every 8 (eight) hours as needed for nausea or vomiting. 11/01/17  Yes Don Perking, [place], MD  sertraline (ZOLOFT) 25 MG tablet Take 25 mg by mouth daily. 10/24/17  Yes [provider]  alum & mag hydroxide-simeth (MAALOX MAX) 400-400-40 MG/5ML suspension Take 5 mLs by mouth every 6 (six) hours as needed for indigestion. 11/01/17   Don Perking, [place], MD  polyethylene glycol [facility] Ethelene Hal) packet Take 17 g by mouth daily as needed.    [provider]  senna-docusate (SENOKOT-S) 8.6-50 MG tablet Take 2 tablets by mouth daily. Patient not taking: Reported on 11/01/2017 10/06/17   [name], Elenora Fender, MD     VITAL SIGNS:  Blood pressure 106/74, pulse 62, temperature 98 F (36.7 C), temperature source Oral, resp. rate 18, height 5\' 3"  (1.6 m), weight 70.3 kg (155 lb), SpO2 99 %, currently breastfeeding.  PHYSICAL EXAMINATION:  Physical Exam  GENERAL:  22 y.o.-year-old patient lying in the bed with no acute distress.  EYES: Pupils equal, round, reactive to light and accommodation. No scleral icterus. Extraocular muscles intact.  HEENT: Head atraumatic, normocephalic. Oropharynx and nasopharynx clear. No oropharyngeal erythema, dry oral mucosa  NECK:  Supple, no jugular venous distention. No thyroid enlargement, no tenderness.  LUNGS: Normal breath sounds bilaterally, no wheezing, rales, rhonchi. No use of accessory muscles of respiration.  CARDIOVASCULAR: S1, S2 normal. No murmurs, rubs, or gallops.  ABDOMEN: Soft, epigastric and right upper quadrant tenderness, nondistended. Bowel sounds present. No organomegaly or mass.  EXTREMITIES: No pedal edema, cyanosis, or clubbing. + 2 pedal & radial pulses b/l.   NEUROLOGIC: Cranial nerves II through XII are intact. No focal Motor or sensory deficits appreciated b/l PSYCHIATRIC: The  patient is alert and oriented x 3. Good affect.  SKIN: No obvious rash, lesion, or ulcer.   LABORATORY PANEL:   CBC Recent Labs  Lab 11/01/17 1804  WBC 9.2  HGB 11.9*  HCT 36.4  PLT 300   ------------------------------------------------------------------------------------------------------------------  Chemistries  Recent Labs  Lab 11/01/17 1804  NA 138  K 4.2  CL 106  CO2 22  GLUCOSE 110*  BUN 16  CREATININE 0.68  CALCIUM 9.5  AST 400*  ALT 250*  ALKPHOS 243*  BILITOT 1.6*   ------------------------------------------------------------------------------------------------------------------  Cardiac Enzymes Recent Labs  Lab 11/01/17 1804  TROPONINI <0.03   ------------------------------------------------------------------------------------------------------------------  RADIOLOGY:  Ct Angio Chest Pe W And/or Wo Contrast  Result Date: 11/01/2017 CLINICAL DATA:  Elevated D-dimer. Postpartum. Upper abdominal pain. EXAM: CT ANGIOGRAPHY CHEST WITH CONTRAST TECHNIQUE: Multidetector CT imaging of the chest was performed using the standard protocol during bolus administration of intravenous contrast. Multiplanar CT image reconstructions and MIPs were obtained to evaluate the vascular anatomy. CONTRAST:  75mL ISOVUE-370 IOPAMIDOL (ISOVUE-370) INJECTION 76% COMPARISON:  None. FINDINGS: Cardiovascular: The study is high quality for the evaluation of pulmonary embolism. There are no filling  defects in the central, lobar, segmental or subsegmental pulmonary artery branches to suggest acute pulmonary embolism. Great vessels are normal in course and caliber. Normal heart size. No significant pericardial fluid/thickening. Mediastinum/Nodes: No discrete thyroid nodules. Unremarkable esophagus. No pathologically enlarged axillary, mediastinal or hilar lymph nodes. Lungs/Pleura: No pneumothorax. No pleural effusion. No acute consolidative airspace disease, lung masses or significant  pulmonary nodules. Upper abdomen: Unremarkable. Musculoskeletal:  No aggressive appearing focal osseous lesions. Review of the MIP images confirms the above findings. IMPRESSION: No pulmonary embolism.  No active disease in the chest. Electronically Signed   By: Delbert Phenix M.D.   On: 11/01/2017 10:19   Ct Abdomen Pelvis W Contrast  Result Date: 11/01/2017 CLINICAL DATA:  Upper abdominal pain, primarily on the right. Vomiting. Four weeks postpartum EXAM: CT ABDOMEN AND PELVIS WITH CONTRAST TECHNIQUE: Multidetector CT imaging of the abdomen and pelvis was performed using the standard protocol following bolus administration of intravenous contrast. CONTRAST:  75mL ISOVUE-300 IOPAMIDOL (ISOVUE-300) INJECTION 61% COMPARISON:  CT abdomen and pelvis March 30, 2015 FINDINGS: Lower chest: Lung bases are clear. Hepatobiliary: There is no liver mass or architectural distortion. There is mild periportal edema. Gallbladder wall is not appreciably thickened. There is no biliary duct dilatation. Pancreas: No pancreatic mass or inflammatory focus. Spleen: No focal splenic lesions are evident. Adrenals/Urinary Tract: Adrenals appear normal bilaterally. Kidneys bilaterally show no evident mass or hydronephrosis on either side. There is no renal or ureteral calculus on either side. Urinary bladder is midline with wall thickness within normal limits. Stomach/Bowel: No appreciable bowel wall or mesenteric thickening. No evident bowel obstruction. No free air or portal venous air. Vascular/Lymphatic: There is no abdominal aortic aneurysm. No vascular lesions are evident. There is no appreciable adenopathy abdomen or pelvis. Reproductive: Uterus is anteverted. Uterus is rather prominent consistent with recent pregnancy. No pelvic mass evident. Other: Appendix appears normal. There is no ascites or abscess in the abdomen or pelvis. There is a rather minimal ventral hernia containing only fat. Musculoskeletal: There are no blastic or  lytic bone lesions. There is no intramuscular or abdominal wall lesion evident. IMPRESSION: 1. Prominent uterus consistent with postpartum state. No focal pelvic mass. 2. Mild periportal edema. Question underlying parenchymal liver disease. Appropriate laboratory correlation advised. No focal liver mass lesion or architectural distortion evident. 3.  No bowel obstruction.  No abscess.  Appendix appears normal. 4. No r show a high area enal or ureteral calculus. No hydronephrosis. 5.  Rather minimal ventral hernia containing only fat. Electronically Signed   By: Bretta Bang III M.D.   On: 11/01/2017 19:12   US Abdomen Limited Ruq  Result Date: 11/01/2017 CLINICAL DATA:  RIGHT upper quadrant pain.  Postpartum. EXAM: ULTRASOUND ABDOMEN LIMITED RIGHT UPPER QUADRANT COMPARISON:  There is an outside scan from the [facility] performed 07/05/2017 which is reported to show gallstones. Those images are not available for my review. FINDINGS: Gallbladder: No gallstones or wall thickening visualized. No sonographic Murphy sign noted by sonographer. Common bile duct: Diameter: 5 mm Liver: No focal lesion identified. Within normal limits in parenchymal echogenicity. Portal vein is patent on color Doppler imaging with normal direction of blood flow towards the liver. IMPRESSION: No gallstones are observed on today's scan.  Negative study. Electronically Signed   By: Elsie Stain M.D.   On: 11/01/2017 07:44     IMPRESSION AND PLAN:   *Choledocholithiasis.  Seems to be most likely cause although no gallstones seen on the CT scan or ultrasound.  Will start patient on clear liquids.  Get MRCP.  Consult GI for ERCP.  Trend liver enzymes in the morning.  Pain and nausea medications added as needed. Normal lipase.  DVT prophylaxis.  SCDs ordered.  No Lovenox or heparin in case patient needs surgery.  All the records are reviewed and case discussed with ED provider. Management plans discussed with the patient,  family and they are in agreement.  CODE STATUS: Full code  TOTAL TIME TAKING CARE OF THIS PATIENT: 40 minutes.   Orie Fisherman M.D on 11/01/2017 at 8:03 PM  Between 7am to 6pm - Pager - [phone]  After 6pm go to [email or web address] - password [email or web address]  [facility]  Office  [phone]  CC: Primary care physician; Leotis Shames, MD  Note: This dictation was prepared with Dragon dictation along with smaller phrase technology. Any transcriptional errors that result from this process are unintentional.

## 2017-11-01 NOTE — ED Notes (Signed)
Informed RN that patient has been roomed and is ready for evaluation.  Patient in NAD at this time and call bell placed within reach.   

## 2017-11-01 NOTE — ED Notes (Signed)
This RN brought patient breast pump from mother/baby unit for her to pump.  Will continue to monitor.

## 2017-11-01 NOTE — ED Triage Notes (Signed)
Patient c/o URQ pain. Patient is 4 weeks postpartum. Patient dx with gallstones during pregnancy. Patient's pain normally lasts for 30 minutes, currently ongoing for 90 minutes

## 2017-11-01 NOTE — ED Provider Notes (Signed)
[facility] Emergency Department Provider Note  ____________________________________________  Time seen: Approximately 7:47 AM  I have reviewed the triage vital signs and the nursing notes.   HISTORY  Chief Complaint Abdominal Pain   HPI Hannah Hancock is a 22 y.o. female history of gallstones currently 4 weeks post partum (NSVD)who presents for evaluation of epigastric abdominal pain. Patient reports the pain woke her up from her sleep at 5 AM. The pain was sharp, located in the epigastric right upper quadrant region, [place], associated with nausea and 1 episode of nonbloody nonbilious emesis. Patient is pain-free at this time. She reports episode was similar to her prior gallbladder attacks. She was supposed to see a surgeon 2 weeks postpartum however has not had a chance to schedule an appointment. No chest pain, no shortness of breath, no fever or chills, no diarrhea, no dysuria or hematuria. No prior abdominal surgeries. Patient denies any personal or family history of blood clots, recent travel or immobilization, leg pain or swelling, hemoptysis, or exogenous hormones.  Past Medical History:  Diagnosis Date  . Abdominal pain, recurrent   . 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 Anxiety   . Asthma    no meds, no recent attacks  . Gallstones   . Tendinitis    R hip  . Urinary tract infection    Last one 4 months ago  . Weight loss     Patient Active Problem List   Diagnosis Date Noted  . Third degree laceration of perineum during delivery, postpartum 10/06/2017  . Gestational hypertension 10/03/2017  . Abdominal pain affecting pregnancy 06/28/2017  . 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 Presence of subdermal contraceptive device 04/07/2014  . Sleep disturbance 11/03/2013  . Adjustment disorder with mixed anxiety and depressed mood 08/12/2013    Past Surgical History:  Procedure Laterality Date  . ADENOIDECTOMY      . ESOPHAGOGASTRODUODENOSCOPY  10/20/2011   Procedure: ESOPHAGOGASTRODUODENOSCOPY (EGD);  Surgeon: Jon GillsJoseph H. Clark, MD;  [facility];  Service: Gastroenterology;  Laterality: N/A;    Prior to Admission medications   Medication Sig Start Date End Date Taking? Authorizing Provider  ibuprofen (ADVIL,MOTRIN) 200 MG tablet Take 200-400 mg by mouth every 6 (six) hours as needed.   Yes [provider]  Multiple Vitamin (MULTIVITAMIN) tablet Take 1 tablet by mouth daily.   Yes [provider]  polyethylene glycol (MIRALAX / GLYCOLAX) packet Take 17 g by mouth daily as needed.   Yes [provider]  sertraline (ZOLOFT) 25 MG tablet Take 25 mg by mouth daily. 10/24/17  Yes [provider]  alum & mag hydroxide-simeth (MAALOX MAX) 400-400-40 MG/5ML suspension Take 5 mLs by mouth every 6 (six) hours as needed for indigestion. 11/01/17   Nita SickleVeronese, [place], MD  famotidine (PEPCID) 20 MG tablet Take 1 tablet (20 mg total) by mouth 2 (two) times daily. 11/01/17 11/01/18  Nita SickleVeronese, [place], MD  ondansetron (ZOFRAN ODT) 4 MG disintegrating tablet Take 1 tablet (4 mg total) by mouth every 8 (eight) hours as needed for nausea or vomiting. 11/01/17   Don PerkingVeronese, [place], MD  senna-docusate (SENOKOT-S) 8.6-50 MG tablet Take 2 tablets by mouth daily. Patient not taking: Reported on 11/01/2017 10/06/17   [name], Elenora Fenderhelsea C, MD    Allergies Patient has no known allergies.  Family History  Problem Relation Age of Onset  . Cholelithiasis Mother   . Miscarriages / [place] Mother   . Hypertension Maternal Grandmother   . Kidney disease  Maternal Grandmother   . Stroke Maternal Grandmother   . Diabetes Paternal Grandfather   . Hypertension Paternal Grandfather     Social History Social History   Tobacco Use  . Smoking status: Former Smoker    Packs/day: 0.30    Years: 1.00    Pack years: 0.30    Types: Cigarettes    Last attempt to quit: 05/29/2015    Years since quitting:  2.4  . Smokeless tobacco: Never Used  Substance Use Topics  . Alcohol use: No    Alcohol/week: 0.0 oz  . Drug use: No    Review of Systems  Constitutional: Negative for fever. Eyes: Negative for visual changes. ENT: Negative for sore throat. Neck: No neck pain  Cardiovascular: Negative for chest pain. Respiratory: Negative for shortness of breath. Gastrointestinal: +abdominal pain, nausea, and vomiting. No diarrhea. Genitourinary: Negative for dysuria. Musculoskeletal: Negative for back pain. Skin: Negative for rash. Neurological: Negative for headaches, weakness or numbness. Psych: No SI or HI  ____________________________________________   PHYSICAL EXAM:  VITAL SIGNS: ED Triage Vitals  Enc Vitals Group     BP 11/01/17 0619 126/71     Pulse Rate 11/01/17 0619 85     Resp 11/01/17 0619 (!) 24     Temp 11/01/17 0630 97.7 F (36.5 C)     Temp Source 11/01/17 0630 Oral     SpO2 11/01/17 0619 100 %     Weight 11/01/17 0616 155 lb (70.3 kg)     Height --      Head Circumference --      Peak Flow --      Pain Score 11/01/17 0616 8     Pain Loc --      Pain Edu? --      Excl. in GC? --     Constitutional: Alert and oriented. Well appearing and in no apparent distress. HEENT:      Head: Normocephalic and atraumatic.         Eyes: Conjunctivae are normal. Sclera is non-icteric.       Mouth/Throat: Mucous membranes are moist.       Neck: Supple with no signs of meningismus. Cardiovascular: Regular rate and rhythm. No murmurs, gallops, or rubs. 2+ symmetrical distal pulses are present in all extremities. No JVD. Respiratory: Normal respiratory effort. Lungs are clear to auscultation bilaterally. No wheezes, crackles, or rhonchi.  Gastrointestinal: Soft, mild RUQ ttp with negative Murphy's sign, non distended with positive bowel sounds. No rebound or guarding. Genitourinary: No CVA tenderness. Musculoskeletal: Nontender with normal range of motion in all extremities. No  edema, cyanosis, or erythema of extremities. Neurologic: Normal speech and language. Face is symmetric. Moving all extremities. No gross focal neurologic deficits are appreciated. Skin: Skin is warm, dry and intact. No rash noted. Psychiatric: Mood and affect are normal. Speech and behavior are normal.  ____________________________________________   LABS (all labs ordered are listed, but only abnormal results are displayed)  Labs Reviewed  COMPREHENSIVE METABOLIC PANEL - Abnormal; Notable for the following components:      Result Value   Glucose, Bld 120 (*)    BUN 22 (*)    All other components within normal limits  CBC - Abnormal; Notable for the following components:   MCV 77.7 (*)    MCH 25.3 (*)    RDW 19.5 (*)    All other components within normal limits  FIBRIN DERIVATIVES D-DIMER ([facility] ONLY) - Abnormal; Notable for the following components:   Fibrin derivatives  D-dimer [facility]) [phone] (*)    All other components within normal limits  LIPASE, BLOOD  HCG, QUANTITATIVE, PREGNANCY  URINALYSIS, COMPLETE (UACMP) WITH MICROSCOPIC  POC URINE PREG, ED   ____________________________________________  EKG  ED ECG REPORT I, Nita Sickle, the attending physician, personally viewed and interpreted this ECG.  Normal sinus rhythm, rate of 88, normal intervals, normal axis, low voltage QRS, no ST elevations or depressions. no prior for comparison. ____________________________________________  RADIOLOGY  I have personally reviewed the images performed during this visit and I agree with the Radiologist's read.   Interpretation by Radiologist:  Ct Angio Chest Pe W And/or Wo Contrast  Result Date: 11/01/2017 CLINICAL DATA:  Elevated D-dimer. Postpartum. Upper abdominal pain. EXAM: CT ANGIOGRAPHY CHEST WITH CONTRAST TECHNIQUE: Multidetector CT imaging of the chest was performed using the standard protocol during bolus administration of intravenous contrast. Multiplanar CT image  reconstructions and MIPs were obtained to evaluate the vascular anatomy. CONTRAST:  75mL ISOVUE-370 IOPAMIDOL (ISOVUE-370) INJECTION 76% COMPARISON:  None. FINDINGS: Cardiovascular: The study is high quality for the evaluation of pulmonary embolism. There are no filling defects in the central, lobar, segmental or subsegmental pulmonary artery branches to suggest acute pulmonary embolism. Great vessels are normal in course and caliber. Normal heart size. No significant pericardial fluid/thickening. Mediastinum/Nodes: No discrete thyroid nodules. Unremarkable esophagus. No pathologically enlarged axillary, mediastinal or hilar lymph nodes. Lungs/Pleura: No pneumothorax. No pleural effusion. No acute consolidative airspace disease, lung masses or significant pulmonary nodules. Upper abdomen: Unremarkable. Musculoskeletal:  No aggressive appearing focal osseous lesions. Review of the MIP images confirms the above findings. IMPRESSION: No pulmonary embolism.  No active disease in the chest. Electronically Signed   By: Delbert Phenix M.D.   On: 11/01/2017 10:19   US Abdomen Limited Ruq  Result Date: 11/01/2017 CLINICAL DATA:  RIGHT upper quadrant pain.  Postpartum. EXAM: ULTRASOUND ABDOMEN LIMITED RIGHT UPPER QUADRANT COMPARISON:  There is an outside scan from the [facility] performed 07/05/2017 which is reported to show gallstones. Those images are not available for my review. FINDINGS: Gallbladder: No gallstones or wall thickening visualized. No sonographic Murphy sign noted by sonographer. Common bile duct: Diameter: 5 mm Liver: No focal lesion identified. Within normal limits in parenchymal echogenicity. Portal vein is patent on color Doppler imaging with normal direction of blood flow towards the liver. IMPRESSION: No gallstones are observed on today's scan.  Negative study. Electronically Signed   By: Elsie Stain M.D.   On: 11/01/2017 07:44      ____________________________________________   PROCEDURES  Procedure(s) performed: None Procedures Critical Care performed:  None ____________________________________________   INITIAL IMPRESSION / ASSESSMENT AND PLAN / ED COURSE  22 y.o. female history of gallstones currently 4 weeks post partum (NSVD)who presents for evaluation of epigastric abdominal pain, nausea, and vomiting. Patient it is well-appearing, in no distress, has normal vital signs, abdomen is soft with mild right upper quadrant tenderness, no rebound or guarding. Labs including CBC, CMP and lipase are all within normal limits. US showing no evidence of gallstones or cholecystitis. Review of Epic shows that patient has had several similar episodes of abdominal pain in the past predating the pregnancy with many [place] negative for stones and only one US done at [facility] on 06/2017 for which images are unavailable but read says + gallstones. Presentation could be consistent with passing a stone vs gastritis vs PUD (patient has been taking frequent NSAIDS due to vaginal tear during delivery) vs PE. Since [place] and labs are WNL,  will send d -dimer.     _________________________ 10:49 AM on 11/01/2017 -----------------------------------------  patient d-dimer is elevated therefore she was sent for CT angiogram which was negative for PE or any other acute findings. After receiving fluids, Pepcid, GI cocktail patient felt improved. She is tolerating by mouth. At this time will believe her presentation is consistent with gastritis in the setting of increased NSAIDs for 4 weeks. Recommend avoiding NSAIDs, switch to Tylenol for pain. Patient can be discharged home on Pepcid, Zofran, Maalox and referral to GI. Discussed return precautions.  As part of my medical decision making, I reviewed the following data within the electronic MEDICAL RECORD NUMBER Nursing notes reviewed and incorporated, Labs reviewed , EKG interpreted , Radiograph reviewed , Notes  from prior ED visits and [place] Controlled Substance Database    Pertinent labs & imaging results that were available during my care of the patient were reviewed by me and considered in my medical decision making (see chart for details).    ____________________________________________   FINAL CLINICAL IMPRESSION(S) / ED DIAGNOSES  Final diagnoses:  Epigastric pain  Acute gastritis without hemorrhage, unspecified gastritis type      NEW MEDICATIONS STARTED DURING THIS VISIT:  ED Discharge Orders        Ordered    alum & mag hydroxide-simeth (MAALOX MAX) 400-400-40 MG/5ML suspension  Every 6 hours PRN     11/01/17 1046    ondansetron (ZOFRAN ODT) 4 MG disintegrating tablet  Every 8 hours PRN     11/01/17 1046    famotidine (PEPCID) 20 MG tablet  2 times daily     11/01/17 1046       Note:  This document was prepared using Dragon voice recognition software and may include unintentional dictation errors.    Don Perking, [place], MD 11/01/17 1051

## 2017-11-01 NOTE — ED Notes (Signed)
Admitting MD at bedside.

## 2017-11-02 ENCOUNTER — Inpatient Hospital Stay: Payer: BLUE CROSS/BLUE SHIELD

## 2017-11-02 ENCOUNTER — Inpatient Hospital Stay: Payer: BLUE CROSS/BLUE SHIELD | Admitting: Anesthesiology

## 2017-11-02 ENCOUNTER — Encounter
Admission: EM | Disposition: A | Discharge status: Home / Self Care | Payer: Self-pay | Source: Home / Self Care | Attending: Specialist

## 2017-11-02 DIAGNOSIS — R932 Abnormal findings on diagnostic imaging of liver and biliary tract: Secondary | ICD-10-CM

## 2017-11-02 DIAGNOSIS — R7989 Other specified abnormal findings of blood chemistry: Secondary | ICD-10-CM

## 2017-11-02 DIAGNOSIS — R945 Abnormal results of liver function studies: Secondary | ICD-10-CM

## 2017-11-02 DIAGNOSIS — K8050 Calculus of bile duct without cholangitis or cholecystitis without obstruction: Secondary | ICD-10-CM

## 2017-11-02 DIAGNOSIS — R740 Nonspecific elevation of levels of transaminase and lactic acid dehydrogenase [LDH]: Secondary | ICD-10-CM

## 2017-11-02 HISTORY — PX: ENDOSCOPIC RETROGRADE CHOLANGIOPANCREATOGRAPHY (ERCP) WITH PROPOFOL: SHX5810

## 2017-11-02 LAB — URINALYSIS, COMPLETE (UACMP) WITH MICROSCOPIC
Bacteria, UA: NONE SEEN
GLUCOSE, UA: NEGATIVE mg/dL
Ketones, ur: NEGATIVE mg/dL
Nitrite: NEGATIVE
PH: 5 (ref 5.0–8.0)
Protein, ur: NEGATIVE mg/dL
SPECIFIC GRAVITY, URINE: 1.041 — AB (ref 1.005–1.030)

## 2017-11-02 LAB — CBC
HCT: 33.2 % — ABNORMAL LOW (ref 35.0–47.0)
Hemoglobin: 10.7 g/dL — ABNORMAL LOW (ref 12.0–16.0)
MCH: 25.1 pg — ABNORMAL LOW (ref 26.0–34.0)
MCHC: 32.2 g/dL (ref 32.0–36.0)
MCV: 78.1 fL — AB (ref 80.0–100.0)
Platelets: 273 10*3/uL (ref 150–440)
RBC: 4.25 MIL/uL (ref 3.80–5.20)
RDW: 19.3 % — AB (ref 11.5–14.5)
WBC: 6.6 10*3/uL (ref 3.6–11.0)

## 2017-11-02 LAB — COMPREHENSIVE METABOLIC PANEL
ALT: 344 U/L — AB (ref 14–54)
AST: 427 U/L — AB (ref 15–41)
Albumin: 3.4 g/dL — ABNORMAL LOW (ref 3.5–5.0)
Alkaline Phosphatase: 309 U/L — ABNORMAL HIGH (ref 38–126)
Anion gap: 6 (ref 5–15)
BILIRUBIN TOTAL: 2.1 mg/dL — AB (ref 0.3–1.2)
BUN: 13 mg/dL (ref 6–20)
CO2: 24 mmol/L (ref 22–32)
CREATININE: 0.66 mg/dL (ref 0.44–1.00)
Calcium: 9 mg/dL (ref 8.9–10.3)
Chloride: 109 mmol/L (ref 101–111)
GFR calc Af Amer: >60 mL/min (ref >60)
Glucose, Bld: 91 mg/dL (ref 65–99)
Potassium: 3.9 mmol/L (ref 3.5–5.1)
Sodium: 139 mmol/L (ref 135–145)
TOTAL PROTEIN: 6.6 g/dL (ref 6.5–8.1)

## 2017-11-02 LAB — PROTIME-INR
INR: 0.99
Prothrombin Time: 13 seconds (ref 11.4–15.2)

## 2017-11-02 LAB — PREGNANCY, URINE: PREG TEST UR: NEGATIVE

## 2017-11-02 SURGERY — ENDOSCOPIC RETROGRADE CHOLANGIOPANCREATOGRAPHY (ERCP) WITH PROPOFOL
Anesthesia: General

## 2017-11-02 MED ORDER — SUGAMMADEX SODIUM 200 MG/2ML IV SOLN
INTRAVENOUS | Status: DC | PRN
  Administered 2017-11-02: 200 mg via INTRAVENOUS

## 2017-11-02 MED ORDER — PROPOFOL 10 MG/ML IV BOLUS
INTRAVENOUS | Status: AC
  Filled 2017-11-02: qty 20

## 2017-11-02 MED ORDER — GLYCOPYRROLATE 0.2 MG/ML IJ SOLN
INTRAMUSCULAR | Status: DC | PRN
  Administered 2017-11-02: 0.1 mg via INTRAVENOUS

## 2017-11-02 MED ORDER — INDOMETHACIN 50 MG RE SUPP
100.0000 mg | Freq: Once | RECTAL | Status: AC
  Administered 2017-11-02: 100 mg via RECTAL
  Filled 2017-11-02: qty 2

## 2017-11-02 MED ORDER — ONDANSETRON HCL 4 MG/2ML IJ SOLN
INTRAMUSCULAR | Status: DC | PRN
  Administered 2017-11-02: 4 mg via INTRAVENOUS

## 2017-11-02 MED ORDER — PROPOFOL 10 MG/ML IV BOLUS
INTRAVENOUS | Status: DC | PRN
  Administered 2017-11-02: 150 mg via INTRAVENOUS

## 2017-11-02 MED ORDER — DEXAMETHASONE SODIUM PHOSPHATE 10 MG/ML IJ SOLN
INTRAMUSCULAR | Status: DC | PRN
  Administered 2017-11-02: 10 mg via INTRAVENOUS

## 2017-11-02 MED ORDER — MIDAZOLAM HCL 2 MG/2ML IJ SOLN
INTRAMUSCULAR | Status: DC | PRN
  Administered 2017-11-02: 2 mg via INTRAVENOUS

## 2017-11-02 MED ORDER — FENTANYL CITRATE (PF) 100 MCG/2ML IJ SOLN
INTRAMUSCULAR | Status: DC | PRN
  Administered 2017-11-02 (×2): 100 ug via INTRAVENOUS

## 2017-11-02 MED ORDER — ROCURONIUM BROMIDE 100 MG/10ML IV SOLN
INTRAVENOUS | Status: DC | PRN
  Administered 2017-11-02: 30 mg via INTRAVENOUS

## 2017-11-02 MED ORDER — MIDAZOLAM HCL 2 MG/2ML IJ SOLN
INTRAMUSCULAR | Status: AC
  Filled 2017-11-02: qty 2

## 2017-11-02 MED ORDER — FENTANYL CITRATE (PF) 100 MCG/2ML IJ SOLN
INTRAMUSCULAR | Status: AC
  Filled 2017-11-02: qty 4

## 2017-11-02 MED ORDER — PRENATAL MULTIVITAMIN CH
1.0000 | ORAL_TABLET | Freq: Every day | ORAL | Status: DC
  Filled 2017-11-02 (×3): qty 1

## 2017-11-02 MED ORDER — INDOMETHACIN 50 MG RE SUPP
RECTAL | Status: AC
  Administered 2017-11-02: 100 mg via RECTAL
  Filled 2017-11-02: qty 2

## 2017-11-02 MED ORDER — SODIUM CHLORIDE 0.9 % IV SOLN
INTRAVENOUS | Status: DC
  Administered 2017-11-02: 14:00:00 via INTRAVENOUS

## 2017-11-02 MED ORDER — ENSURE ENLIVE PO LIQD
237.0000 mL | Freq: Two times a day (BID) | ORAL | Status: DC
  Administered 2017-11-04: 237 mL via ORAL

## 2017-11-02 MED ORDER — GADOBENATE DIMEGLUMINE 529 MG/ML IV SOLN
15.0000 mL | Freq: Once | INTRAVENOUS | Status: AC | PRN
  Administered 2017-11-02: 14 mL via INTRAVENOUS

## 2017-11-02 MED ORDER — LIDOCAINE HCL (CARDIAC) 20 MG/ML IV SOLN
INTRAVENOUS | Status: DC | PRN
  Administered 2017-11-02: 80 mg via INTRAVENOUS

## 2017-11-02 NOTE — Consult Note (Signed)
Lucilla Lame, MD [facility]  [address] [place], [place] [address] Phone: [phone] Fax : [phone]  Consultation  Referring Provider:     Dr. Verdell Carmine Primary Care Physician:  Glendon Axe, MD Primary Gastroenterologist: Althia Forts         Reason for Consultation:     Common bile duct stone  Date of Admission:  11/01/2017 Date of Consultation:  11/02/2017         HPI:   Hannah Hancock is a 22 y.o. female who came in with a history of gallstones and is supposed to have her gallbladder out next week.  The patient came in with severe epigastric pain that woke her up from her sleep.  The patient is 4 weeks postpartum.  She states that this pain is similar to abdominal pain she has had in the past consistent with her gallbladder disease.    The patient had an MRCP today that showed her to have a 3 mm distal common bile duct stone with subtle narrowing of the biliary tree at the confluence of the right and left bile ducts into the common bile duct.  There is also gallbladder wall thickening potentially inflammation from the patient's hypoalbuminemia.    The patient reports that her abdominal pain is better now than it was when she was admitted.  She also had liver enzymes sent that showed her to have a increased bilirubin at 2.1 with AST of 423 and ALT of 344 with an elevated alk phos of 309.  These labs are slightly worse from 6 PM last night.  On the morning of yesterday her labs showed normal liver enzymes.  Past Medical History:  Diagnosis Date  . Abdominal pain, recurrent   . 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 Anxiety   . Asthma    no meds, no recent attacks  . Gallstones   . Tendinitis    R hip  . Urinary tract infection    Last one 4 months ago  . Weight loss     Past Surgical History:  Procedure Laterality Date  . ADENOIDECTOMY    . ESOPHAGOGASTRODUODENOSCOPY  10/20/2011   Procedure: ESOPHAGOGASTRODUODENOSCOPY (EGD);  Surgeon: [name],  MD;  Location: [facility];  Service: Gastroenterology;  Laterality: N/A;    Prior to Admission medications   Medication Sig Start Date End Date Taking? Authorizing Provider  famotidine (PEPCID) 20 MG tablet Take 1 tablet (20 mg total) by mouth 2 (two) times daily. 11/01/17 11/01/18 Yes [name], [name], MD  Multiple Vitamin (MULTIVITAMIN) tablet Take 1 tablet by mouth daily.   Yes [provider]  ondansetron (ZOFRAN ODT) 4 MG disintegrating tablet Take 1 tablet (4 mg total) by mouth every 8 (eight) hours as needed for nausea or vomiting. 11/01/17  Yes [name], [name], MD  sertraline (ZOLOFT) 25 MG tablet Take 25 mg by mouth daily. 10/24/17  Yes [provider]  alum & mag hydroxide-simeth (MAALOX MAX) 400-400-40 MG/5ML suspension Take 5 mLs by mouth every 6 (six) hours as needed for indigestion. 11/01/17   [name], [name], MD  polyethylene glycol [facility] / Floria Raveling) packet Take 17 g by mouth daily as needed.    [provider]  senna-docusate (SENOKOT-S) 8.6-50 MG tablet Take 2 tablets by mouth daily. Patient not taking: Reported on 11/01/2017 10/06/17   [name], [name], MD    Family History  Problem Relation Age of Onset  . Cholelithiasis Mother   . Miscarriages / [place] Mother   .  Hypertension Maternal Grandmother   . Kidney disease Maternal Grandmother   . Stroke Maternal Grandmother   . Diabetes Paternal Grandfather   . Hypertension Paternal Grandfather      Social History   Tobacco Use  . Smoking status: Former Smoker    Packs/day: 0.30    Years: 1.00    Pack years: 0.30    Types: Cigarettes    Last attempt to quit: 05/29/2015    Years since quitting: 2.4  . Smokeless tobacco: Never Used  Substance Use Topics  . Alcohol use: No    Alcohol/week: 0.0 oz  . Drug use: No    Allergies as of 11/01/2017  . (No Known Allergies)    Review of Systems:    All systems reviewed and negative except where noted in HPI.   Physical Exam:  Vital signs  in last 24 hours: Temp:  [98 F (36.7 C)-98.2 F (36.8 C)] 98 F (36.7 C) (03/15 0421) Pulse Rate:  [54-74] 54 (03/15 0421) Resp:  [18] 18 (03/15 0421) BP: (106-115)/(57-80) 109/57 (03/15 0421) SpO2:  [98 %-100 %] 98 % (03/15 0421) Weight:  [154 lb 1.6 oz (69.9 kg)-155 lb (70.3 kg)] 154 lb 1.6 oz (69.9 kg) (03/14 2106) Last BM Date: 10/31/17 General:   Pleasant, cooperative in NAD Head:  Normocephalic and atraumatic. Eyes:   No icterus.   Conjunctiva pink. PERRLA. Ears:  Normal auditory acuity. Neck:  Supple; no masses or thyroidomegaly Lungs: Respirations even and unlabored. Lungs clear to auscultation bilaterally.   No wheezes, crackles, or rhonchi.  Heart:  Regular rate and rhythm;  Without murmur, clicks, rubs or gallops Abdomen:  Soft, nondistended, nontender. Normal bowel sounds. No appreciable masses or hepatomegaly.  No rebound or guarding.  Rectal:  Not performed. Msk:  Symmetrical without gross deformities.    Extremities:  Without edema, cyanosis or clubbing. Neurologic:  Alert and oriented x3;  grossly normal neurologically. Skin:  Intact without significant lesions or rashes. Cervical Nodes:  No significant cervical adenopathy. Psych:  Alert and cooperative. Normal affect.  LAB RESULTS: Recent Labs    11/01/17 0616 11/01/17 1804 11/02/17 0557  WBC 10.9 9.2 6.6  HGB 12.2 11.9* 10.7*  HCT 37.5 36.4 33.2*  PLT 289 300 273   BMET Recent Labs    11/01/17 0616 11/01/17 1804 11/02/17 0557  NA 141 138 139  K 3.6 4.2 3.9  CL 107 106 109  CO2 _0 GLUCOSE 120* 110* 91  BUN 22* 16 13  CREATININE 0.78 0.68 0.66  CALCIUM 9.6 9.5 9.0   LFT Recent Labs    11/02/17 0557  PROT 6.6  ALBUMIN 3.4*  AST 427*  ALT 344*  ALKPHOS 309*  BILITOT 2.1*   PT/INR Recent Labs    11/02/17 0557  LABPROT 13.0  INR 0.99    STUDIES: Ct Angio Chest Pe W And/or Wo Contrast  Result Date: 11/01/2017 CLINICAL DATA:  Elevated D-dimer. Postpartum. Upper abdominal  pain. EXAM: CT ANGIOGRAPHY CHEST WITH CONTRAST TECHNIQUE: Multidetector CT imaging of the chest was performed using the standard protocol during bolus administration of intravenous contrast. Multiplanar CT image reconstructions and MIPs were obtained to evaluate the vascular anatomy. CONTRAST:  73m ISOVUE-370 IOPAMIDOL (ISOVUE-370) INJECTION 76% COMPARISON:  None. FINDINGS: Cardiovascular: The study is high quality for the evaluation of pulmonary embolism. There are no filling defects in the central, lobar, segmental or subsegmental pulmonary artery branches to suggest acute pulmonary embolism. Great vessels are normal in course and caliber. Normal  heart size. No significant pericardial fluid/thickening. Mediastinum/Nodes: No discrete thyroid nodules. Unremarkable esophagus. No pathologically enlarged axillary, mediastinal or hilar lymph nodes. Lungs/Pleura: No pneumothorax. No pleural effusion. No acute consolidative airspace disease, lung masses or significant pulmonary nodules. Upper abdomen: Unremarkable. Musculoskeletal:  No aggressive appearing focal osseous lesions. Review of the MIP images confirms the above findings. IMPRESSION: No pulmonary embolism.  No active disease in the chest. Electronically Signed   By: Ilona Sorrel M.D.   On: 11/01/2017 10:19   Ct Abdomen Pelvis W Contrast  Result Date: 11/01/2017 CLINICAL DATA:  Upper abdominal pain, primarily on the right. Vomiting. Four weeks postpartum EXAM: CT ABDOMEN AND PELVIS WITH CONTRAST TECHNIQUE: Multidetector CT imaging of the abdomen and pelvis was performed using the standard protocol following bolus administration of intravenous contrast. CONTRAST:  79m ISOVUE-300 IOPAMIDOL (ISOVUE-300) INJECTION 61% COMPARISON:  CT abdomen and pelvis March 30, 2015 FINDINGS: Lower chest: Lung bases are clear. Hepatobiliary: There is no liver mass or architectural distortion. There is mild periportal edema. Gallbladder wall is not appreciably thickened. There  is no biliary duct dilatation. Pancreas: No pancreatic mass or inflammatory focus. Spleen: No focal splenic lesions are evident. Adrenals/Urinary Tract: Adrenals appear normal bilaterally. Kidneys bilaterally show no evident mass or hydronephrosis on either side. There is no renal or ureteral calculus on either side. Urinary bladder is midline with wall thickness within normal limits. Stomach/Bowel: No appreciable bowel wall or mesenteric thickening. No evident bowel obstruction. No free air or portal venous air. Vascular/Lymphatic: There is no abdominal aortic aneurysm. No vascular lesions are evident. There is no appreciable adenopathy abdomen or pelvis. Reproductive: Uterus is anteverted. Uterus is rather prominent consistent with recent pregnancy. No pelvic mass evident. Other: Appendix appears normal. There is no ascites or abscess in the abdomen or pelvis. There is a rather minimal ventral hernia containing only fat. Musculoskeletal: There are no blastic or lytic bone lesions. There is no intramuscular or abdominal wall lesion evident. IMPRESSION: 1. Prominent uterus consistent with postpartum state. No focal pelvic mass. 2. Mild periportal edema. Question underlying parenchymal liver disease. Appropriate laboratory correlation advised. No focal liver mass lesion or architectural distortion evident. 3.  No bowel obstruction.  No abscess.  Appendix appears normal. 4. No r show a high area enal or ureteral calculus. No hydronephrosis. 5.  Rather minimal ventral hernia containing only fat. Electronically Signed   By: WLowella GripIII M.D.   On: 11/01/2017 19:12   Mr 3d Recon At Scanner  Result Date: 11/02/2017 CLINICAL DATA:  Epigastric and right upper quadrant abdominal pain elevated D-dimer level. Four weeks post partum. Vomiting and elevated liver enzymes with periportal edema. EXAM: MRI ABDOMEN WITHOUT AND WITH CONTRAST (INCLUDING MRCP) TECHNIQUE: Multiplanar multisequence MR imaging of the abdomen  was performed both before and after the administration of intravenous contrast. Heavily T2-weighted images of the biliary and pancreatic ducts were obtained, and three-dimensional MRCP images were rendered by post processing. CONTRAST:  126mMULTIHANCE GADOBENATE DIMEGLUMINE 529 MG/ML IV SOLN COMPARISON:  Multiple exams, including CT and ultrasound exams of 11/01/2017 FINDINGS: Lower chest: Unremarkable Hepatobiliary: On image 7/9 there is high suspicion for a 3 mm stone in the distal common bile duct. Common bile duct caliber 4 mm. A small stone is subtly suggested on image 22/3 has a small signal hypointensity terminating the fluid signal column in the distal CBD. There is some narrowing of the biliary tree at the confluence of the right and left ducts but without intrahepatic biliary dilatation.  I do not appreciate a mass lesion or abnormal restriction of diffusion in this vicinity. There is mild abnormal gallbladder wall thickening at 4 mm. Mild periportal edema. No definite gallstones in the gallbladder itself. No hepatic steatosis or significant abnormal focal hepatic enhancement. Pancreas:  Unremarkable Spleen:  Unremarkable Adrenals/Urinary Tract:  Unremarkable Stomach/Bowel: Unremarkable Vascular/Lymphatic:  Unremarkable Other:  No supplemental non-categorized findings. Musculoskeletal: Unremarkable IMPRESSION: 1. Suspected 3 mm distal CBD stone, best shown on image 7/9. This is a subtle finding that was not appreciated at the preliminary review; I phoned nursing personnel on the clinical team at 7:50 a.m. on 11/02/2017 in order to convey this revised opinion. 2. Subtle narrowing of the biliary tree at the confluence of the right and left bile ducts into the common hepatic duct. I do not see a Klatskin tumor/mass lesion in this vicinity nor is there intrahepatic biliary dilatation. This could reflect a low-grade cholangitis. 3. Gallbladder wall thickening, potentially from inflammation or from the patient's  hypoalbuminemia. 4. Nonspecific periportal edema. Electronically Signed   By: Van Clines M.D.   On: 11/02/2017 07:58   Mr Abdomen Mrcp Moise Boring Contast  Result Date: 11/02/2017 CLINICAL DATA:  Epigastric and right upper quadrant abdominal pain elevated D-dimer level. Four weeks post partum. Vomiting and elevated liver enzymes with periportal edema. EXAM: MRI ABDOMEN WITHOUT AND WITH CONTRAST (INCLUDING MRCP) TECHNIQUE: Multiplanar multisequence MR imaging of the abdomen was performed both before and after the administration of intravenous contrast. Heavily T2-weighted images of the biliary and pancreatic ducts were obtained, and three-dimensional MRCP images were rendered by post processing. CONTRAST:  71m MULTIHANCE GADOBENATE DIMEGLUMINE 529 MG/ML IV SOLN COMPARISON:  Multiple exams, including CT and ultrasound exams of 11/01/2017 FINDINGS: Lower chest: Unremarkable Hepatobiliary: On image 7/9 there is high suspicion for a 3 mm stone in the distal common bile duct. Common bile duct caliber 4 mm. A small stone is subtly suggested on image 22/3 has a small signal hypointensity terminating the fluid signal column in the distal CBD. There is some narrowing of the biliary tree at the confluence of the right and left ducts but without intrahepatic biliary dilatation. I do not appreciate a mass lesion or abnormal restriction of diffusion in this vicinity. There is mild abnormal gallbladder wall thickening at 4 mm. Mild periportal edema. No definite gallstones in the gallbladder itself. No hepatic steatosis or significant abnormal focal hepatic enhancement. Pancreas:  Unremarkable Spleen:  Unremarkable Adrenals/Urinary Tract:  Unremarkable Stomach/Bowel: Unremarkable Vascular/Lymphatic:  Unremarkable Other:  No supplemental non-categorized findings. Musculoskeletal: Unremarkable IMPRESSION: 1. Suspected 3 mm distal CBD stone, best shown on image 7/9. This is a subtle finding that was not appreciated at the  preliminary review; I phoned nursing personnel on the clinical team at 7:50 a.m. on 11/02/2017 in order to convey this revised opinion. 2. Subtle narrowing of the biliary tree at the confluence of the right and left bile ducts into the common hepatic duct. I do not see a Kl[place]kin tumor/mass lesion in this vicinity nor is there intrahepatic biliary dilatation. This could reflect a low-grade cholangitis. 3. Gallbladder wall thickening, potentially from inflammation or from the patient's hypoalbuminemia. 4. Nonspecific periportal edema. Electronically Signed   By: WVan ClinesM.D.   On: 11/02/2017 07:58   [place]Abdomen Limited Ruq  Result Date: 11/01/2017 CLINICAL DATA:  RIGHT upper quadrant pain.  Postpartum. EXAM: ULTRASOUND ABDOMEN LIMITED RIGHT UPPER QUADRANT COMPARISON:  [facility] is reported  to show gallstones. Those images are not available for my review. FINDINGS: Gallbladder: No gallstones or wall thickening visualized. No sonographic Murphy sign noted by sonographer. Common bile duct: Diameter: 5 mm Liver: No focal lesion identified. Within normal limits in parenchymal echogenicity. Portal vein is patent on color Doppler imaging with normal direction of blood flow towards the liver. IMPRESSION: No gallstones are observed on today's scan.  Negative study. Electronically Signed   By: Staci Righter M.D.   On: 11/01/2017 07:44      Impression / Plan:   Hannah Hancock is a 22 y.o. y/o female with a common bile duct stone and abnormal liver enzymes.  The patient has had an increase in her liver enzymes since yesterday morning which showed normal liver enzymes and they have slowly been increasing.  The patient will be set up for an ERCP for today. I have discussed risks & benefits which include, but are not limited to, bleeding, infection, perforation & drug reaction.  The patient agrees with this plan & written consent will be  obtained.       Thank you for involving me in the care of this patient.      LOS: 1 day   Lucilla Lame, MD  11/02/2017, 11:29 AM   Note: This dictation was prepared with Dragon dictation along with smaller phrase technology. Any transcriptional errors that result from this process are unintentional.

## 2017-11-02 NOTE — Progress Notes (Addendum)
Initial Nutrition Assessment  DOCUMENTATION CODES:   Not applicable  INTERVENTION:   Ensure Enlive po BID, each supplement provides 350 kcal and 20 grams of protein  Prenatal multivitamin daily   NUTRITION DIAGNOSIS:   Increased nutrient needs related to other (see comment)(pt is post-partum and breastfeeding ) as evidenced by increased estimated needs from protein and energy.  GOAL:   Patient will meet greater than or equal to 90% of their needs  MONITOR:   PO intake, Supplement acceptance, Labs, Weight trends, I & O's  REASON FOR ASSESSMENT:   Malnutrition Screening Tool    ASSESSMENT:   22 y.o. female with a known history of anxiety and 4 weeks postpartum presents to the emergency room for a second time today due to epigastric and right upper quadrant abdominal pain.  Patient was diagnosed with gallstones during her pregnancy    Met with pt in room today. Pt reports good appetite and oral intake pta. Pt is 4 weeks postpartum and is breastfeeding. Pt has continued to pump milk while in hospital. Pt reports that she is still taking her prenatal vitamins when she remembers. Pt's highest pregnancy weight was around 195lbs; pt has lost ~40lbs and is now back down to her usual body weight which is around ~160lbs. Pt NPO for ERCP today. Pt is willing to drink chocolate or vanilla Ensure when diet is advanced. RD will add multivitamin and supplements.   Medications reviewed and include: zoloft  Labs reviewed: alkphos 309(H), 3.4(L), AST 427(H), ALT 344(H), tbili 2.1(H)  Nutrition-Focused physical exam completed. Findings are no fat depletion, no muscle depletion, and no edema.   Diet Order:  Diet NPO time specified  EDUCATION NEEDS:   Education needs have been addressed  Skin: Reviewed RN Assessment  Last BM:  3/13  Height:   Ht Readings from Last 1 Encounters:  11/01/17 5' 3"  (1.6 m)    Weight:   Wt Readings from Last 1 Encounters:  11/01/17 154 lb 1.6 oz (69.9  kg)    Ideal Body Weight:  52.3 kg  BMI:  Body mass index is 27.3 kg/m.  Estimated Nutritional Needs:   Kcal:  2200-2500kcal/day   Protein:  84-98g/day   Fluid:  >2.1L/day   Koleen Distance MS, RD, LDN Pager #[phone] After Hours Pager: [phone]

## 2017-11-02 NOTE — Anesthesia Preprocedure Evaluation (Signed)
Anesthesia Evaluation  Patient identified by MRN, date of birth, ID band Patient awake    Reviewed: Allergy & Precautions, H&P , NPO status , reviewed documented beta blocker date and time   Airway Mallampati: II       Dental  (+) Chipped   Pulmonary asthma , former smoker,  As child, resolved   Pulmonary exam normal        Cardiovascular hypertension, Normal cardiovascular exam     Neuro/Psych PSYCHIATRIC DISORDERS Anxiety negative neurological ROS     GI/Hepatic Neg liver ROS, GERD  Controlled,  Endo/Other    Renal/GU negative Renal ROS  negative genitourinary   Musculoskeletal   Abdominal   Peds  Hematology negative hematology ROS (+)   Anesthesia Other Findings 4 weeks post partum, breastfeeding - dicussed anesthesia concerns w pt  Reproductive/Obstetrics                             Anesthesia Physical Anesthesia Plan  ASA: II  Anesthesia Plan: General ETT   Post-op Pain Management:    Induction:   PONV Risk Score and Plan: 3 and Propofol infusion  Airway Management Planned:   Additional Equipment:   Intra-op Plan:   Post-operative Plan:   Informed Consent: I have reviewed the patients History and Physical, chart, labs and discussed the procedure including the risks, benefits and alternatives for the proposed anesthesia with the patient or authorized representative who has indicated his/her understanding and acceptance.   Dental Advisory Given  Plan Discussed with: CRNA  Anesthesia Plan Comments:         Anesthesia Quick Evaluation

## 2017-11-02 NOTE — Plan of Care (Signed)
Pt going for ercp today. No complaints of nausea or pain so far

## 2017-11-02 NOTE — Op Note (Signed)
[facility] Gastroenterology Patient Name: Hannah Hancock Procedure Date: 11/02/2017 2:30 PM MRN: [id number] Account #: [id number] Date of Birth: [birth year] Admit Type: Inpatient Age: 22 Room: [facility] ENDO ROOM 4 Gender: Female Note Status: Finalized Procedure:            ERCP Indications:          Bile duct stone(s) Providers:            Midge Minium MD, MD Referring MD:         No Local Md, MD (Referring MD) Medicines:            Propofol per Anesthesia Complications:        No immediate complications. Procedure:            Pre-Anesthesia Assessment:                       - Prior to the procedure, a History and Physical was                        performed, and patient medications and allergies were                        reviewed. The patient's tolerance of previous                        anesthesia was also reviewed. The risks and benefits of                        the procedure and the sedation options and risks were                        discussed with the patient. All questions were                        answered, and informed consent was obtained. Prior                        Anticoagulants: The patient has taken no previous                        anticoagulant or antiplatelet agents. ASA Grade                        Assessment: II - A patient with mild systemic disease.                        After reviewing the risks and benefits, the patient was                        deemed in satisfactory condition to undergo the                        procedure.                       After obtaining informed consent, the scope was passed                        under direct vision. Throughout the procedure, the  patient's blood pressure, pulse, and oxygen saturations                        were monitored continuously. The ERCP was introduced                        through the mouth, and used to inject contrast into and                        used to  inject contrast into the bile duct. The ERCP                        was accomplished without difficulty. The patient                        tolerated the procedure well. Findings:      The scout film was normal. The esophagus was successfully intubated       under direct vision. The scope was advanced to a normal major papilla in       the descending duodenum without detailed examination of the pharynx,       larynx and associated structures, and upper GI tract. The upper GI tract       was grossly normal. The bile duct was deeply cannulated with the       short-nosed traction sphincterotome. Contrast was injected. I personally       interpreted the bile duct images. There was brisk flow of contrast       through the ducts. Image quality was excellent. Contrast extended to the       entire biliary tree. The lower third of the main bile duct contained       filling defect(s) thought to be a stone. A wire was passed into the       biliary tree. A 7 mm biliary sphincterotomy was made with a traction       (standard) sphincterotome using ERBE electrocautery. There was no       post-sphincterotomy bleeding. The biliary tree was swept with a 15 mm       balloon starting at the bifurcation. One stone was removed. No stones       remained. Impression:           - A filling defect consistent with a stone was seen on                        the cholangiogram.                       - Choledocholithiasis was found. Complete removal was                        accomplished by biliary sphincterotomy and balloon                        extraction.                       - A biliary sphincterotomy was performed.                       - The biliary tree was swept. Recommendation:       - Return patient to hospital  ward for ongoing care.                       - Full liquid diet. Procedure Code(s):    --- Professional ---                       [phone], Endoscopic retrograde cholangiopancreatography                         (ERCP); with removal of calculi/debris from                        biliary/pancreatic duct(s)                       43262, Endoscopic retrograde cholangiopancreatography                        (ERCP); with sphincterotomy/papillotomy                       [phone], Endoscopic catheterization of the biliary ductal                        system, radiological supervision and interpretation Diagnosis Code(s):    --- Professional ---                       K80.50, Calculus of bile duct without cholangitis or                        cholecystitis without obstruction                       R93.2, Abnormal findings on diagnostic imaging of liver                        and biliary tract CPT copyright 2016 American Medical Association. All rights reserved. The codes documented in this report are preliminary and upon coder review may  be revised to meet current compliance requirements. Midge Miniumarren Kathalina Ostermann MD, MD 11/02/2017 2:55:16 PM This report has been signed electronically. Number of Addenda: 0 Note Initiated On: 11/02/2017 2:30 PM      [facility]

## 2017-11-02 NOTE — Transfer of Care (Signed)
Immediate Anesthesia Transfer of Care Note  Patient: Hannah Hancock  Procedure(s) Performed: ENDOSCOPIC RETROGRADE CHOLANGIOPANCREATOGRAPHY (ERCP) WITH PROPOFOL (N/A )  Patient Location: PACU  Anesthesia Type:General  Level of Consciousness: sedated  Airway & Oxygen Therapy: Patient connected to face mask oxygen  Post-op Assessment: Post -op Vital signs reviewed and stable  Post vital signs: stable  Last Vitals:  Vitals:   11/02/17 1336 11/02/17 1507  BP: 93/63 119/82  Pulse: 66 (!) 53  Resp: 16 15  Temp: 36.9 C 36.5 C  SpO2: 99% 100%    Last Pain:  Vitals:   11/02/17 1336  TempSrc: Oral  PainSc:          Complications: No apparent anesthesia complications

## 2017-11-02 NOTE — Anesthesia Post-op Follow-up Note (Signed)
Anesthesia QCDR form completed.        

## 2017-11-02 NOTE — Anesthesia Procedure Notes (Signed)
Procedure Name: Intubation Date/Time: 11/02/2017 2:21 PM Performed by: Sherol DadeMacMang, Mckynlie Vanderslice H, CRNA Pre-anesthesia Checklist: Patient identified, Emergency Drugs available, Suction available, Patient being monitored and Timeout performed Patient Re-evaluated:Patient Re-evaluated prior to induction Oxygen Delivery Method: Circle system utilized Preoxygenation: Pre-oxygenation with 100% oxygen Induction Type: IV induction Ventilation: Mask ventilation without difficulty Laryngoscope Size: Miller and 3 Grade View: Grade I Tube type: Oral Tube size: 7.0 mm Number of attempts: 1 Airway Equipment and Method: Stylet Placement Confirmation: ETT inserted through vocal cords under direct vision,  positive ETCO2,  CO2 detector and breath sounds checked- equal and bilateral Secured at: 21 cm Tube secured with: Tape Dental Injury: Teeth and Oropharynx as per pre-operative assessment

## 2017-11-02 NOTE — Progress Notes (Signed)
[employer] [facility] [place] [facility]   PATIENT NAME: Hannah Hancock    MR#:  [id number]  DATE OF BIRTH:  1996-08-11  SUBJECTIVE:   Patient here due to right upper quadrant/epigastric abdominal pain and suspected to have choledocholithiasis. Patient's MRCP was positive and patient underwent an ERCP today with sphincterotomy and stone extraction.  REVIEW OF SYSTEMS:    Review of Systems  Constitutional: Negative for chills and fever.  HENT: Negative for congestion and tinnitus.   Eyes: Negative for blurred vision and double vision.  Respiratory: Negative for cough, shortness of breath and wheezing.   Cardiovascular: Negative for chest pain, orthopnea and PND.  Gastrointestinal: Positive for abdominal pain. Negative for diarrhea, nausea and vomiting.  Genitourinary: Negative for dysuria and hematuria.  Neurological: Negative for dizziness, sensory change and focal weakness.  All other systems reviewed and are negative.   Nutrition: Clear liquids Tolerating Diet: yes Tolerating PT: Ambulatory  DRUG ALLERGIES:  No Known Allergies  VITALS:  Blood pressure 115/83, pulse (!) 55, temperature 97.8 F (36.6 C), resp. rate 14, height 5\' 3"  (1.6 m), weight 69.9 kg (154 lb 1.6 oz), SpO2 100 %, currently breastfeeding.  PHYSICAL EXAMINATION:   Physical Exam  GENERAL:  22 y.o.-year-old patient lying in bed in no acute distress.  EYES: Pupils equal, round, reactive to light and accommodation. No scleral icterus. Extraocular muscles intact.  HEENT: Head atraumatic, normocephalic. Oropharynx and nasopharynx clear.  NECK:  Supple, no jugular venous distention. No thyroid enlargement, no tenderness.  LUNGS: Normal breath sounds bilaterally, no wheezing, rales, rhonchi. No use of accessory muscles of respiration.  CARDIOVASCULAR: S1, S2 normal. No murmurs, rubs, or gallops.  ABDOMEN: Soft, Tender in the RUQ, Epigastric area, nondistended. Bowel sounds present. No organomegaly or  mass.  EXTREMITIES: No cyanosis, clubbing or edema b/l.    NEUROLOGIC: Cranial nerves II through XII are intact. No focal Motor or sensory deficits b/l.   PSYCHIATRIC: The patient is alert and oriented x 3.  SKIN: No obvious rash, lesion, or ulcer.    LABORATORY PANEL:   CBC Recent Labs  Lab 11/02/17 0557  WBC 6.6  HGB 10.7*  HCT 33.2*  PLT 273   ------------------------------------------------------------------------------------------------------------------  Chemistries  Recent Labs  Lab 11/02/17 0557  NA 139  K 3.9  CL 109  CO2 24  GLUCOSE 91  BUN 13  CREATININE 0.66  CALCIUM 9.0  AST 427*  ALT 344*  ALKPHOS 309*  BILITOT 2.1*   ------------------------------------------------------------------------------------------------------------------  Cardiac Enzymes Recent Labs  Lab 11/01/17 1804  TROPONINI <0.03   ------------------------------------------------------------------------------------------------------------------  RADIOLOGY:  Ct Angio Chest Pe W And/or Wo Contrast  Result Date: 11/01/2017 CLINICAL DATA:  Elevated D-dimer. Postpartum. Upper abdominal pain. EXAM: CT ANGIOGRAPHY CHEST WITH CONTRAST TECHNIQUE: Multidetector CT imaging of the chest was performed using the standard protocol during bolus administration of intravenous contrast. Multiplanar CT image reconstructions and MIPs were obtained to evaluate the vascular anatomy. CONTRAST:  75mL ISOVUE-370 IOPAMIDOL (ISOVUE-370) INJECTION 76% COMPARISON:  None. FINDINGS: Cardiovascular: The study is high quality for the evaluation of pulmonary embolism. There are no filling defects in the central, lobar, segmental or subsegmental pulmonary artery branches to suggest acute pulmonary embolism. Great vessels are normal in course and caliber. Normal heart size. No significant pericardial fluid/thickening. Mediastinum/Nodes: No discrete thyroid nodules. Unremarkable esophagus. No pathologically enlarged axillary,  mediastinal or hilar lymph nodes. Lungs/Pleura: No pneumothorax. No pleural effusion. No acute consolidative airspace disease, lung masses or significant pulmonary nodules. Upper abdomen: Unremarkable.  Musculoskeletal:  No aggressive appearing focal osseous lesions. Review of the MIP images confirms the above findings. IMPRESSION: No pulmonary embolism.  No active disease in the chest. Electronically Signed   By: Delbert PhenixJason A Poff M.D.   On: 11/01/2017 10:19   Ct Abdomen Pelvis W Contrast  Result Date: 11/01/2017 CLINICAL DATA:  Upper abdominal pain, primarily on the right. Vomiting. Four weeks postpartum EXAM: CT ABDOMEN AND PELVIS WITH CONTRAST TECHNIQUE: Multidetector CT imaging of the abdomen and pelvis was performed using the standard protocol following bolus administration of intravenous contrast. CONTRAST:  75mL ISOVUE-300 IOPAMIDOL (ISOVUE-300) INJECTION 61% COMPARISON:  CT abdomen and pelvis March 30, 2015 FINDINGS: Lower chest: Lung bases are clear. Hepatobiliary: There is no liver mass or architectural distortion. There is mild periportal edema. Gallbladder wall is not appreciably thickened. There is no biliary duct dilatation. Pancreas: No pancreatic mass or inflammatory focus. Spleen: No focal splenic lesions are evident. Adrenals/Urinary Tract: Adrenals appear normal bilaterally. Kidneys bilaterally show no evident mass or hydronephrosis on either side. There is no renal or ureteral calculus on either side. Urinary bladder is midline with wall thickness within normal limits. Stomach/Bowel: No appreciable bowel wall or mesenteric thickening. No evident bowel obstruction. No free air or portal venous air. Vascular/Lymphatic: There is no abdominal aortic aneurysm. No vascular lesions are evident. There is no appreciable adenopathy abdomen or pelvis. Reproductive: Uterus is anteverted. Uterus is rather prominent consistent with recent pregnancy. No pelvic mass evident. Other: Appendix appears normal. There  is no ascites or abscess in the abdomen or pelvis. There is a rather minimal ventral hernia containing only fat. Musculoskeletal: There are no blastic or lytic bone lesions. There is no intramuscular or abdominal wall lesion evident. IMPRESSION: 1. Prominent uterus consistent with postpartum state. No focal pelvic mass. 2. Mild periportal edema. Question underlying parenchymal liver disease. Appropriate laboratory correlation advised. No focal liver mass lesion or architectural distortion evident. 3.  No bowel obstruction.  No abscess.  Appendix appears normal. 4. No r show a high area enal or ureteral calculus. No hydronephrosis. 5.  Rather minimal ventral hernia containing only fat. Electronically Signed   By: Bretta BangWilliam  Woodruff III M.D.   On: 11/01/2017 19:12   Mr 3d Recon At Scanner  Result Date: 11/02/2017 CLINICAL DATA:  Epigastric and right upper quadrant abdominal pain elevated D-dimer level. Four weeks post partum. Vomiting and elevated liver enzymes with periportal edema. EXAM: MRI ABDOMEN WITHOUT AND WITH CONTRAST (INCLUDING MRCP) TECHNIQUE: Multiplanar multisequence MR imaging of the abdomen was performed both before and after the administration of intravenous contrast. Heavily T2-weighted images of the biliary and pancreatic ducts were obtained, and three-dimensional MRCP images were rendered by post processing. CONTRAST:  14mL MULTIHANCE GADOBENATE DIMEGLUMINE 529 MG/ML IV SOLN COMPARISON:  Multiple exams, including CT and ultrasound exams of 11/01/2017 FINDINGS: Lower chest: Unremarkable Hepatobiliary: On image 7/9 there is high suspicion for a 3 mm stone in the distal common bile duct. Common bile duct caliber 4 mm. A small stone is subtly suggested on image 22/3 has a small signal hypointensity terminating the fluid signal column in the distal CBD. There is some narrowing of the biliary tree at the confluence of the right and left ducts but without intrahepatic biliary dilatation. I do not  appreciate a mass lesion or abnormal restriction of diffusion in this vicinity. There is mild abnormal gallbladder wall thickening at 4 mm. Mild periportal edema. No definite gallstones in the gallbladder itself. No hepatic steatosis or significant abnormal focal  hepatic enhancement. Pancreas:  Unremarkable Spleen:  Unremarkable Adrenals/Urinary Tract:  Unremarkable Stomach/Bowel: Unremarkable Vascular/Lymphatic:  Unremarkable Other:  No supplemental non-categorized findings. Musculoskeletal: Unremarkable IMPRESSION: 1. Suspected 3 mm distal CBD stone, best shown on image 7/9. This is a subtle finding that was not appreciated at the preliminary review; I phoned nursing personnel on the clinical team at 7:50 a.m. on 11/02/2017 in order to convey this revised opinion. 2. Subtle narrowing of the biliary tree at the confluence of the right and left bile ducts into the common hepatic duct. I do not see a Klatskin tumor/mass lesion in this vicinity nor is there intrahepatic biliary dilatation. This could reflect a low-grade cholangitis. 3. Gallbladder wall thickening, potentially from inflammation or from the patient's hypoalbuminemia. 4. Nonspecific periportal edema. Electronically Signed   By: Gaylyn Rong M.D.   On: 11/02/2017 07:58   Dg C-arm 1-60 Min-no Report  Result Date: 11/02/2017 Fluoroscopy was utilized by the requesting physician.  No radiographic interpretation.   Mr Abdomen Mrcp Vivien Rossetti Contast  Result Date: 11/02/2017 CLINICAL DATA:  Epigastric and right upper quadrant abdominal pain elevated D-dimer level. Four weeks post partum. Vomiting and elevated liver enzymes with periportal edema. EXAM: MRI ABDOMEN WITHOUT AND WITH CONTRAST (INCLUDING MRCP) TECHNIQUE: Multiplanar multisequence MR imaging of the abdomen was performed both before and after the administration of intravenous contrast. Heavily T2-weighted images of the biliary and pancreatic ducts were obtained, and three-dimensional MRCP  images were rendered by post processing. CONTRAST:  14mL MULTIHANCE GADOBENATE DIMEGLUMINE 529 MG/ML IV SOLN COMPARISON:  Multiple exams, including CT and ultrasound exams of 11/01/2017 FINDINGS: Lower chest: Unremarkable Hepatobiliary: On image 7/9 there is high suspicion for a 3 mm stone in the distal common bile duct. Common bile duct caliber 4 mm. A small stone is subtly suggested on image 22/3 has a small signal hypointensity terminating the fluid signal column in the distal CBD. There is some narrowing of the biliary tree at the confluence of the right and left ducts but without intrahepatic biliary dilatation. I do not appreciate a mass lesion or abnormal restriction of diffusion in this vicinity. There is mild abnormal gallbladder wall thickening at 4 mm. Mild periportal edema. No definite gallstones in the gallbladder itself. No hepatic steatosis or significant abnormal focal hepatic enhancement. Pancreas:  Unremarkable Spleen:  Unremarkable Adrenals/Urinary Tract:  Unremarkable Stomach/Bowel: Unremarkable Vascular/Lymphatic:  Unremarkable Other:  No supplemental non-categorized findings. Musculoskeletal: Unremarkable IMPRESSION: 1. Suspected 3 mm distal CBD stone, best shown on image 7/9. This is a subtle finding that was not appreciated at the preliminary review; I phoned nursing personnel on the clinical team at 7:50 a.m. on 11/02/2017 in order to convey this revised opinion. 2. Subtle narrowing of the biliary tree at the confluence of the right and left bile ducts into the common hepatic duct. I do not see a Klatskin tumor/mass lesion in this vicinity nor is there intrahepatic biliary dilatation. This could reflect a low-grade cholangitis. 3. Gallbladder wall thickening, potentially from inflammation or from the patient's hypoalbuminemia. 4. Nonspecific periportal edema. Electronically Signed   By: Gaylyn Rong M.D.   On: 11/02/2017 07:58   US Abdomen Limited Ruq  Result Date:  11/01/2017 CLINICAL DATA:  RIGHT upper quadrant pain.  Postpartum. EXAM: ULTRASOUND ABDOMEN LIMITED RIGHT UPPER QUADRANT COMPARISON:  There is an outside scan from the [facility] performed 07/05/2017 which is reported to show gallstones. Those images are not available for my review. FINDINGS: Gallbladder: No gallstones or wall thickening visualized. No  sonographic Eulah Pont sign noted by sonographer. Common bile duct: Diameter: 5 mm Liver: No focal lesion identified. Within normal limits in parenchymal echogenicity. Portal vein is patent on color Doppler imaging with normal direction of blood flow towards the liver. IMPRESSION: No gallstones are observed on today's scan.  Negative study. Electronically Signed   By: Elsie Stain M.D.   On: 11/01/2017 07:44     ASSESSMENT AND PLAN:   22 year old female who is 4 weeks postpartum presented to the hospital due to abdominal pain and noted to have abnormal LFTs and suspected to have choledocholithiasis.  1. Choledocholithiasis-is the cause of patient's abnormal LFTs and abdominal pain. Patient's MRCP this morning was positive for a common bile duct stone with dilatation. Patient is now status post ERCP with sphincterotomy and stone extraction. -we'll follow LFTs, follow for post-ERCP pancreatitis. Appreciate gastroenterology input.  2. Abnormal LFTs-secondary to #1.  3. Depression-continue Zoloft  Likely d/c home tomorrow.   All the records are reviewed and case discussed with Care Management/Social Worker. Management plans discussed with the patient, family and they are in agreement.  CODE STATUS: Full code  DVT Prophylaxis: Ted's & SCD's  TOTAL TIME TAKING CARE OF THIS PATIENT: 30 minutes.   POSSIBLE D/C IN 1-2 DAYS, DEPENDING ON CLINICAL CONDITION.   Houston Siren M.D on 11/02/2017 at 3:58 PM  Between 7am to 6pm - Pager - [phone]  After 6pm go to [email or web address] - Social research officer, government  [employer] [facility]   Office  [phone]  CC: Primary care physician; Leotis Shames, MD

## 2017-11-03 ENCOUNTER — Encounter
Admission: EM | Disposition: A | Discharge status: Home / Self Care | Payer: Self-pay | Source: Home / Self Care | Attending: Specialist

## 2017-11-03 ENCOUNTER — Inpatient Hospital Stay: Payer: BLUE CROSS/BLUE SHIELD | Admitting: Anesthesiology

## 2017-11-03 ENCOUNTER — Inpatient Hospital Stay: Payer: BLUE CROSS/BLUE SHIELD

## 2017-11-03 DIAGNOSIS — K8042 Calculus of bile duct with acute cholecystitis without obstruction: Secondary | ICD-10-CM

## 2017-11-03 HISTORY — PX: CHOLECYSTECTOMY: SHX55

## 2017-11-03 LAB — COMPREHENSIVE METABOLIC PANEL
ALBUMIN: 3.6 g/dL (ref 3.5–5.0)
ALT: 255 U/L — ABNORMAL HIGH (ref 14–54)
ANION GAP: 9 (ref 5–15)
AST: 136 U/L — ABNORMAL HIGH (ref 15–41)
Alkaline Phosphatase: 332 U/L — ABNORMAL HIGH (ref 38–126)
BUN: 15 mg/dL (ref 6–20)
CO2: 23 mmol/L (ref 22–32)
Calcium: 9.3 mg/dL (ref 8.9–10.3)
Chloride: 108 mmol/L (ref 101–111)
Creatinine, Ser: 0.62 mg/dL (ref 0.44–1.00)
GFR calc Af Amer: >60 mL/min (ref >60)
GFR calc non Af Amer: >60 mL/min (ref >60)
GLUCOSE: 90 mg/dL (ref 65–99)
POTASSIUM: 4.4 mmol/L (ref 3.5–5.1)
SODIUM: 140 mmol/L (ref 135–145)
Total Bilirubin: 0.9 mg/dL (ref 0.3–1.2)
Total Protein: 6.8 g/dL (ref 6.5–8.1)

## 2017-11-03 LAB — LIPASE, BLOOD: Lipase: 38 U/L (ref 11–51)

## 2017-11-03 SURGERY — LAPAROSCOPIC CHOLECYSTECTOMY
Anesthesia: General

## 2017-11-03 MED ORDER — BUPIVACAINE-EPINEPHRINE 0.5% -1:200000 IJ SOLN
INTRAMUSCULAR | Status: DC | PRN
  Administered 2017-11-03: 10 mL

## 2017-11-03 MED ORDER — MEPERIDINE HCL 50 MG/ML IJ SOLN: 6.2500 mg | INTRAMUSCULAR | Status: DC | PRN

## 2017-11-03 MED ORDER — OXYCODONE HCL 5 MG PO TABS: 5.0000 mg | ORAL_TABLET | Freq: Once | ORAL | Status: DC | PRN

## 2017-11-03 MED ORDER — ROCURONIUM BROMIDE 50 MG/5ML IV SOLN
INTRAVENOUS | Status: AC
  Filled 2017-11-03: qty 1

## 2017-11-03 MED ORDER — PROMETHAZINE HCL 25 MG/ML IJ SOLN
INTRAMUSCULAR | Status: AC
  Filled 2017-11-03: qty 1

## 2017-11-03 MED ORDER — LIDOCAINE HCL (CARDIAC) 20 MG/ML IV SOLN
INTRAVENOUS | Status: DC | PRN
  Administered 2017-11-03: 80 mg via INTRAVENOUS

## 2017-11-03 MED ORDER — KETOROLAC TROMETHAMINE 30 MG/ML IJ SOLN
INTRAMUSCULAR | Status: DC | PRN
  Administered 2017-11-03: 30 mg via INTRAVENOUS

## 2017-11-03 MED ORDER — PROMETHAZINE HCL 25 MG/ML IJ SOLN
6.2500 mg | INTRAMUSCULAR | Status: DC | PRN
  Administered 2017-11-03: 12.5 mg via INTRAVENOUS

## 2017-11-03 MED ORDER — PROPOFOL 10 MG/ML IV BOLUS
INTRAVENOUS | Status: AC
  Filled 2017-11-03: qty 20

## 2017-11-03 MED ORDER — ONDANSETRON HCL 4 MG/2ML IJ SOLN
INTRAMUSCULAR | Status: AC
  Filled 2017-11-03: qty 2

## 2017-11-03 MED ORDER — SUGAMMADEX SODIUM 200 MG/2ML IV SOLN
INTRAVENOUS | Status: DC | PRN
  Administered 2017-11-03: 300 mg via INTRAVENOUS

## 2017-11-03 MED ORDER — FENTANYL CITRATE (PF) 100 MCG/2ML IJ SOLN
INTRAMUSCULAR | Status: DC | PRN
  Administered 2017-11-03 (×4): 50 ug via INTRAVENOUS

## 2017-11-03 MED ORDER — DEXAMETHASONE SODIUM PHOSPHATE 10 MG/ML IJ SOLN
INTRAMUSCULAR | Status: DC | PRN
  Administered 2017-11-03: 10 mg via INTRAVENOUS

## 2017-11-03 MED ORDER — MIDAZOLAM HCL 2 MG/2ML IJ SOLN
INTRAMUSCULAR | Status: DC | PRN
  Administered 2017-11-03: 2 mg via INTRAVENOUS

## 2017-11-03 MED ORDER — MIDAZOLAM HCL 2 MG/2ML IJ SOLN
INTRAMUSCULAR | Status: AC
  Filled 2017-11-03: qty 2

## 2017-11-03 MED ORDER — ONDANSETRON HCL 4 MG/2ML IJ SOLN
INTRAMUSCULAR | Status: DC | PRN
  Administered 2017-11-03: 4 mg via INTRAVENOUS

## 2017-11-03 MED ORDER — PROPOFOL 10 MG/ML IV BOLUS
INTRAVENOUS | Status: DC | PRN
  Administered 2017-11-03: 140 mg via INTRAVENOUS

## 2017-11-03 MED ORDER — FENTANYL CITRATE (PF) 100 MCG/2ML IJ SOLN
INTRAMUSCULAR | Status: AC
  Filled 2017-11-03: qty 2

## 2017-11-03 MED ORDER — KETOROLAC TROMETHAMINE 30 MG/ML IJ SOLN
INTRAMUSCULAR | Status: AC
  Filled 2017-11-03: qty 1

## 2017-11-03 MED ORDER — ACETAMINOPHEN 10 MG/ML IV SOLN
INTRAVENOUS | Status: AC
  Filled 2017-11-03: qty 100

## 2017-11-03 MED ORDER — LIDOCAINE HCL (PF) 2 % IJ SOLN
INTRAMUSCULAR | Status: AC
  Filled 2017-11-03: qty 10

## 2017-11-03 MED ORDER — SODIUM CHLORIDE FLUSH 0.9 % IV SOLN
INTRAVENOUS | Status: AC
  Filled 2017-11-03: qty 10

## 2017-11-03 MED ORDER — ACETAMINOPHEN 10 MG/ML IV SOLN
INTRAVENOUS | Status: DC | PRN
  Administered 2017-11-03: 1000 mg via INTRAVENOUS

## 2017-11-03 MED ORDER — DEXAMETHASONE SODIUM PHOSPHATE 10 MG/ML IJ SOLN
INTRAMUSCULAR | Status: AC
  Filled 2017-11-03: qty 1

## 2017-11-03 MED ORDER — BUPIVACAINE-EPINEPHRINE (PF) 0.5% -1:200000 IJ SOLN
INTRAMUSCULAR | Status: AC
  Filled 2017-11-03: qty 30

## 2017-11-03 MED ORDER — SUGAMMADEX SODIUM 200 MG/2ML IV SOLN
INTRAVENOUS | Status: AC
  Filled 2017-11-03: qty 4

## 2017-11-03 MED ORDER — FENTANYL CITRATE (PF) 100 MCG/2ML IJ SOLN: 25.0000 ug | INTRAMUSCULAR | Status: DC | PRN

## 2017-11-03 MED ORDER — OXYCODONE HCL 5 MG/5ML PO SOLN: 5.0000 mg | Freq: Once | ORAL | Status: DC | PRN

## 2017-11-03 MED ORDER — LACTATED RINGERS IV SOLN
INTRAVENOUS | Status: DC | PRN
  Administered 2017-11-03: 13:00:00 via INTRAVENOUS

## 2017-11-03 MED ORDER — ROCURONIUM BROMIDE 100 MG/10ML IV SOLN
INTRAVENOUS | Status: DC | PRN
  Administered 2017-11-03: 40 mg via INTRAVENOUS
  Administered 2017-11-03: 15 mg via INTRAVENOUS

## 2017-11-03 MED ORDER — SUGAMMADEX SODIUM 200 MG/2ML IV SOLN
INTRAVENOUS | Status: AC
  Filled 2017-11-03: qty 2

## 2017-11-03 SURGICAL SUPPLY — 36 items
APPLIER CLIP LOGIC TI 5 (MISCELLANEOUS) ×3 IMPLANT
BLADE CLIPPER SURG (BLADE) ×3 IMPLANT
BLADE SURG SZ11 CARB STEEL (BLADE) ×3 IMPLANT
CANISTER SUCT 1200ML W/VALVE (MISCELLANEOUS) ×3 IMPLANT
CHLORAPREP W/TINT 26ML (MISCELLANEOUS) ×3 IMPLANT
DERMABOND ADVANCED (GAUZE/BANDAGES/DRESSINGS) ×2
DERMABOND ADVANCED .7 DNX12 (GAUZE/BANDAGES/DRESSINGS) ×1 IMPLANT
DRAPE SHEET LG 3/4 BI-LAMINATE (DRAPES) ×3 IMPLANT
ELECT REM PT RETURN 9FT ADLT (ELECTROSURGICAL) ×3
ELECTRODE REM PT RTRN 9FT ADLT (ELECTROSURGICAL) ×1 IMPLANT
GLOVE BIO SURGEON STRL SZ 6.5 (GLOVE) ×2 IMPLANT
GLOVE BIO SURGEONS STRL SZ 6.5 (GLOVE) ×1
GOWN STRL REUS W/ TWL LRG LVL3 (GOWN DISPOSABLE) ×4 IMPLANT
GOWN STRL REUS W/TWL LRG LVL3 (GOWN DISPOSABLE) ×8
GRASPER SUT TROCAR 14GX15 (MISCELLANEOUS) IMPLANT
HEMOSTAT SURGICEL 2X3 (HEMOSTASIS) ×3 IMPLANT
IRRIGATION STRYKERFLOW (MISCELLANEOUS) ×1 IMPLANT
IRRIGATOR STRYKERFLOW (MISCELLANEOUS) ×3
IV NS 1000ML (IV SOLUTION) ×2
IV NS 1000ML BAXH (IV SOLUTION) ×1 IMPLANT
KIT TURNOVER KIT A (KITS) ×3 IMPLANT
LABEL OR SOLS (LABEL) ×3 IMPLANT
NEEDLE HYPO 25X1 1.5 SAFETY (NEEDLE) ×3 IMPLANT
NEEDLE INSUFFLATION 14GA 120MM (NEEDLE) ×3 IMPLANT
NS IRRIG 500ML POUR BTL (IV SOLUTION) ×3 IMPLANT
PACK LAP CHOLECYSTECTOMY (MISCELLANEOUS) ×3 IMPLANT
PENCIL ELECTRO HAND CTR (MISCELLANEOUS) IMPLANT
POUCH SPECIMEN RETRIEVAL 10MM (ENDOMECHANICALS) ×3 IMPLANT
SCISSORS METZENBAUM CVD 33 (INSTRUMENTS) ×3 IMPLANT
SLEEVE ENDOPATH XCEL 5M (ENDOMECHANICALS) ×6 IMPLANT
SUT MNCRL AB 4-0 PS2 18 (SUTURE) ×3 IMPLANT
SUT VIC AB 0 CT1 36 (SUTURE) IMPLANT
SUT VICRYL 0 AB UR-6 (SUTURE) ×3 IMPLANT
TROCAR XCEL NON-BLD 11X100MML (ENDOMECHANICALS) ×3 IMPLANT
TROCAR XCEL NON-BLD 5MMX100MML (ENDOMECHANICALS) ×3 IMPLANT
TUBING INSUFFLATION (TUBING) ×3 IMPLANT

## 2017-11-03 NOTE — Progress Notes (Signed)
Patient ID: Hannah Hancock, female   DOB: Jul 02, 1996, 22 y.o.   MRN: [id number]  [facility] PROGRESS NOTE  LEVERNE TESSLER WUJ:[id number] DOB: 02-21-1996 DOA: 11/01/2017 PCP: Leotis Shames, MD  HPI/Subjective: Patient seen earlier this morning.  Patient states she had a vaginal delivery 4 weeks ago.  Patient was still having some soreness in her abdomen but felt better than when she came in.  Objective: Vitals:   11/02/17 2207 11/03/17 0505  BP: 103/63 (!) 102/58  Pulse: (!) 49 (!) 48  Resp: 20 20  Temp: 98.3 F (36.8 C) 97.9 F (36.6 C)  SpO2: 97% 98%    Filed Weights   11/01/17 1712 11/01/17 2106  Weight: 70.3 kg (155 lb) 69.9 kg (154 lb 1.6 oz)    ROS: Review of Systems  Constitutional: Negative for chills and fever.  Eyes: Negative for blurred vision.  Respiratory: Negative for cough and shortness of breath.   Cardiovascular: Negative for chest pain.  Gastrointestinal: Positive for abdominal pain and nausea. Negative for constipation, diarrhea and vomiting.  Genitourinary: Negative for dysuria.  Musculoskeletal: Negative for joint pain.  Neurological: Negative for dizziness and headaches.   Exam: Physical Exam  Constitutional: She is oriented to person, place, and time.  HENT:  Nose: No mucosal edema.  Mouth/Throat: No oropharyngeal exudate or posterior oropharyngeal edema.  Eyes: Conjunctivae, EOM and lids are normal. Pupils are equal, round, and reactive to light.  Neck: No JVD present. Carotid bruit is not present. No edema present. No thyroid mass and no thyromegaly present.  Cardiovascular: S1 normal and S2 normal. Exam reveals no gallop.  No murmur heard. Pulses:      Dorsalis pedis pulses are 2+ on the right side, and 2+ on the left side.  Respiratory: No respiratory distress. She has no wheezes. She has no rhonchi. She has no rales.  GI: Soft. Bowel sounds are normal. There is tenderness.  Musculoskeletal:       Right ankle: She exhibits no swelling.        Left ankle: She exhibits no swelling.  Lymphadenopathy:    She has no cervical adenopathy.  Neurological: She is alert and oriented to person, place, and time. No cranial nerve deficit.  Skin: Skin is warm. No rash noted. Nails show no clubbing.  Psychiatric: She has a normal mood and affect.      Data Reviewed: Basic Metabolic Panel: Recent Labs  Lab 11/01/17 0616 11/01/17 1804 11/02/17 0557 11/03/17 0600  NA 141 138 139 140  K 3.6 4.2 3.9 4.4  CL 107 106 109 108  CO2 23 22 24 23   GLUCOSE 120* 110* 91 90  BUN 22* 16 13 15   CREATININE 0.78 0.68 0.66 0.62  CALCIUM 9.6 9.5 9.0 9.3   Liver Function Tests: Recent Labs  Lab 11/01/17 0616 11/01/17 1804 11/02/17 0557 11/03/17 0600  AST 33 400* 427* 136*  ALT 17 250* 344* 255*  ALKPHOS 126 243* 309* 332*  BILITOT 0.7 1.6* 2.1* 0.9  PROT 7.9 7.7 6.6 6.8  ALBUMIN 4.2 4.1 3.4* 3.6   Recent Labs  Lab 11/01/17 0616 11/01/17 1804 11/03/17 0600  LIPASE 40 41 38   CBC: Recent Labs  Lab 11/01/17 0616 11/01/17 1804 11/02/17 0557  WBC 10.9 9.2 6.6  NEUTROABS  --  6.7*  --   HGB 12.2 11.9* 10.7*  HCT 37.5 36.4 33.2*  MCV 77.7* 76.6* 78.1*  PLT 289 300 273   Cardiac Enzymes: Recent Labs  Lab 11/01/17 1804  TROPONINI <0.03     Studies: Ct Abdomen Pelvis W Contrast  Result Date: 11/01/2017 CLINICAL DATA:  Upper abdominal pain, primarily on the right. Vomiting. Four weeks postpartum EXAM: CT ABDOMEN AND PELVIS WITH CONTRAST TECHNIQUE: Multidetector CT imaging of the abdomen and pelvis was performed using the standard protocol following bolus administration of intravenous contrast. CONTRAST:  75mL ISOVUE-300 IOPAMIDOL (ISOVUE-300) INJECTION 61% COMPARISON:  CT abdomen and pelvis March 30, 2015 FINDINGS: Lower chest: Lung bases are clear. Hepatobiliary: There is no liver mass or architectural distortion. There is mild periportal edema. Gallbladder wall is not appreciably thickened. There is no biliary duct  dilatation. Pancreas: No pancreatic mass or inflammatory focus. Spleen: No focal splenic lesions are evident. Adrenals/Urinary Tract: Adrenals appear normal bilaterally. Kidneys bilaterally show no evident mass or hydronephrosis on either side. There is no renal or ureteral calculus on either side. Urinary bladder is midline with wall thickness within normal limits. Stomach/Bowel: No appreciable bowel wall or mesenteric thickening. No evident bowel obstruction. No free air or portal venous air. Vascular/Lymphatic: There is no abdominal aortic aneurysm. No vascular lesions are evident. There is no appreciable adenopathy abdomen or pelvis. Reproductive: Uterus is anteverted. Uterus is rather prominent consistent with recent pregnancy. No pelvic mass evident. Other: Appendix appears normal. There is no ascites or abscess in the abdomen or pelvis. There is a rather minimal ventral hernia containing only fat. Musculoskeletal: There are no blastic or lytic bone lesions. There is no intramuscular or abdominal wall lesion evident. IMPRESSION: 1. Prominent uterus consistent with postpartum state. No focal pelvic mass. 2. Mild periportal edema. Question underlying parenchymal liver disease. Appropriate laboratory correlation advised. No focal liver mass lesion or architectural distortion evident. 3.  No bowel obstruction.  No abscess.  Appendix appears normal. 4. No r show a high area enal or ureteral calculus. No hydronephrosis. 5.  Rather minimal ventral hernia containing only fat. Electronically Signed   By: Bretta Bang III M.D.   On: 11/01/2017 19:12   Mr 3d Recon At Scanner  Result Date: 11/02/2017 CLINICAL DATA:  Epigastric and right upper quadrant abdominal pain elevated D-dimer level. Four weeks post partum. Vomiting and elevated liver enzymes with periportal edema. EXAM: MRI ABDOMEN WITHOUT AND WITH CONTRAST (INCLUDING MRCP) TECHNIQUE: Multiplanar multisequence MR imaging of the abdomen was performed both  before and after the administration of intravenous contrast. Heavily T2-weighted images of the biliary and pancreatic ducts were obtained, and three-dimensional MRCP images were rendered by post processing. CONTRAST:  14mL MULTIHANCE GADOBENATE DIMEGLUMINE 529 MG/ML IV SOLN COMPARISON:  Multiple exams, including CT and ultrasound exams of 11/01/2017 FINDINGS: Lower chest: Unremarkable Hepatobiliary: On image 7/9 there is high suspicion for a 3 mm stone in the distal common bile duct. Common bile duct caliber 4 mm. A small stone is subtly suggested on image 22/3 has a small signal hypointensity terminating the fluid signal column in the distal CBD. There is some narrowing of the biliary tree at the confluence of the right and left ducts but without intrahepatic biliary dilatation. I do not appreciate a mass lesion or abnormal restriction of diffusion in this vicinity. There is mild abnormal gallbladder wall thickening at 4 mm. Mild periportal edema. No definite gallstones in the gallbladder itself. No hepatic steatosis or significant abnormal focal hepatic enhancement. Pancreas:  Unremarkable Spleen:  Unremarkable Adrenals/Urinary Tract:  Unremarkable Stomach/Bowel: Unremarkable Vascular/Lymphatic:  Unremarkable Other:  No supplemental non-categorized findings. Musculoskeletal: Unremarkable IMPRESSION: 1. Suspected 3 mm distal CBD stone, best shown on image  7/9. This is a subtle finding that was not appreciated at the preliminary review; I phoned nursing personnel on the clinical team at 7:50 a.m. on 11/02/2017 in order to convey this revised opinion. 2. Subtle narrowing of the biliary tree at the confluence of the right and left bile ducts into the common hepatic duct. I do not see a Klatskin tumor/mass lesion in this vicinity nor is there intrahepatic biliary dilatation. This could reflect a low-grade cholangitis. 3. Gallbladder wall thickening, potentially from inflammation or from the patient's hypoalbuminemia.  4. Nonspecific periportal edema. Electronically Signed   By: Gaylyn Rong M.D.   On: 11/02/2017 07:58   Dg C-arm 1-60 Min-no Report  Result Date: 11/02/2017 Fluoroscopy was utilized by the requesting physician.  No radiographic interpretation.   Mr Abdomen Mrcp Vivien Rossetti Contast  Result Date: 11/02/2017 CLINICAL DATA:  Epigastric and right upper quadrant abdominal pain elevated D-dimer level. Four weeks post partum. Vomiting and elevated liver enzymes with periportal edema. EXAM: MRI ABDOMEN WITHOUT AND WITH CONTRAST (INCLUDING MRCP) TECHNIQUE: Multiplanar multisequence MR imaging of the abdomen was performed both before and after the administration of intravenous contrast. Heavily T2-weighted images of the biliary and pancreatic ducts were obtained, and three-dimensional MRCP images were rendered by post processing. CONTRAST:  14mL MULTIHANCE GADOBENATE DIMEGLUMINE 529 MG/ML IV SOLN COMPARISON:  Multiple exams, including CT and ultrasound exams of 11/01/2017 FINDINGS: Lower chest: Unremarkable Hepatobiliary: On image 7/9 there is high suspicion for a 3 mm stone in the distal common bile duct. Common bile duct caliber 4 mm. A small stone is subtly suggested on image 22/3 has a small signal hypointensity terminating the fluid signal column in the distal CBD. There is some narrowing of the biliary tree at the confluence of the right and left ducts but without intrahepatic biliary dilatation. I do not appreciate a mass lesion or abnormal restriction of diffusion in this vicinity. There is mild abnormal gallbladder wall thickening at 4 mm. Mild periportal edema. No definite gallstones in the gallbladder itself. No hepatic steatosis or significant abnormal focal hepatic enhancement. Pancreas:  Unremarkable Spleen:  Unremarkable Adrenals/Urinary Tract:  Unremarkable Stomach/Bowel: Unremarkable Vascular/Lymphatic:  Unremarkable Other:  No supplemental non-categorized findings. Musculoskeletal: Unremarkable  IMPRESSION: 1. Suspected 3 mm distal CBD stone, best shown on image 7/9. This is a subtle finding that was not appreciated at the preliminary review; I phoned nursing personnel on the clinical team at 7:50 a.m. on 11/02/2017 in order to convey this revised opinion. 2. Subtle narrowing of the biliary tree at the confluence of the right and left bile ducts into the common hepatic duct. I do not see a Klatskin tumor/mass lesion in this vicinity nor is there intrahepatic biliary dilatation. This could reflect a low-grade cholangitis. 3. Gallbladder wall thickening, potentially from inflammation or from the patient's hypoalbuminemia. 4. Nonspecific periportal edema. Electronically Signed   By: Gaylyn Rong M.D.   On: 11/02/2017 07:58    Scheduled Meds: . [MAR Hold] feeding supplement (ENSURE ENLIVE)  237 mL Oral BID BM  . [MAR Hold] prenatal multivitamin  1 tablet Oral Q1200  . [MAR Hold] sertraline  25 mg Oral Daily   Continuous Infusions:  Assessment/Plan:  1. Choledocholithiasis, elevated liver function test and gallbladder wall thickening.  Patient had ERCP with stone removal yesterday.  Elevated liver function test improved.  Case discussed with general surgery and patient is in the operating room for gallbladder removal today.  Hopefully if everything goes well with cholecystectomy, she may be able  to get out of the hospital tomorrow. 2. Depression on Zoloft  Code Status:     Code Status Orders  (From admission, onward)        Start     Ordered   11/01/17 1955  Full code  Continuous     11/01/17 1955    Code Status History    Date Active Date Inactive Code Status Order ID Comments User Context   10/04/2017 23:07 10/06/2017 20:10 Full Code [id number]  Genia DelHaviland, Margaret, CNM Inpatient   10/03/2017 15:39 10/04/2017 23:07 Full Code [id number]  Randa NgoMcVey, Rebecca A, CNM Inpatient   10/03/2017 14:38 10/03/2017 15:39 Full Code [id number]  Randa NgoMcVey, Rebecca A, CNM Inpatient   06/28/2017 02:03  06/28/2017 06:23 Full Code [id number]  Sharee PimpleJones, Caron W, CNM Inpatient     Disposition Plan: Potentially home tomorrow  Consultants:  Gastroenterology  General surgery  Procedures:  ERCP  Cholecystectomy  Time spent: 25 minutes  Lynton Crescenzo [employer]  [facility]

## 2017-11-03 NOTE — Progress Notes (Signed)
Cephas Darby, MD [address]  [place]  [place], [place] [address]  Main: [phone]  Fax: [phone] Pager: [phone]   Subjective: Patient is nothing by mouth to undergo cholecystectomy today. She underwent ERCP yesterday with sphincterotomy and stone extraction from common bile duct by Dr. Allen Norris. She denies any complaints today including nausea, vomiting, epigastric pain. Her LFTs today are improving, T bili is normal   Objective: Vital signs in last 24 hours: Vitals:   11/02/17 1659 11/02/17 1804 11/02/17 2207 11/03/17 0505  BP: 107/64 109/65 103/63 (!) 102/58  Pulse: (!) 54 (!) 56 (!) 49 (!) 48  Resp:   20 20  Temp:   98.3 F (36.8 C) 97.9 F (36.6 C)  TempSrc:   Oral Oral  SpO2: 99% 100% 97% 98%  Weight:      Height:       Weight change:   Intake/Output Summary (Last 24 hours) at 11/03/2017 1319 Last data filed at 11/03/2017 6803 Gross per 24 hour  Intake 1260 ml  Output -  Net 1260 ml     Exam: Heart:: Regular rate and rhythm or S1S2 present Lungs: clear to auscultation Abdomen: soft, nontender, normal bowel sounds   Lab Results: CBC Latest Ref Rng & Units 11/02/2017 11/01/2017 11/01/2017  WBC 3.6 - 11.0 K/uL 6.6 9.2 10.9  Hemoglobin 12.0 - 16.0 g/dL 10.7(L) 11.9(L) 12.2  Hematocrit 35.0 - 47.0 % 33.2(L) 36.4 37.5  Platelets 150 - 440 K/uL 273 300 289   BMP Latest Ref Rng & Units 11/03/2017 11/02/2017 11/01/2017  Glucose 65 - 99 mg/dL 90 91 110(H)  BUN 6 - 20 mg/dL 15 13 16   Creatinine 0.44 - 1.00 mg/dL 0.62 0.66 0.68  Sodium 135 - 145 mmol/L 140 139 138  Potassium 3.5 - 5.1 mmol/L 4.4 3.9 4.2  Chloride 101 - 111 mmol/L 108 109 106  CO2 22 - 32 mmol/L 23 24 22   Calcium 8.9 - 10.3 mg/dL 9.3 9.0 9.5   Hepatic Function Latest Ref Rng & Units 11/03/2017 11/02/2017 11/01/2017  Total Protein 6.5 - 8.1 g/dL 6.8 6.6 7.7  Albumin 3.5 - 5.0 g/dL 3.6 3.4(L) 4.1  AST 15 - 41 U/L 136(H) 427(H) 400(H)  ALT 14 - 54 U/L 255(H) 344(H) 250(H)  Alk  Phosphatase 38 - 126 U/L 332(H) 309(H) 243(H)  Total Bilirubin 0.3 - 1.2 mg/dL 0.9 2.1(H) 1.6(H)  Bilirubin, Direct 0.0 - 0.3 mg/dL - - -   Micro Results: No results found for this or any previous visit (from the past 240 hour(s)). Studies/Results: Ct Abdomen Pelvis W Contrast  Result Date: 11/01/2017 CLINICAL DATA:  Upper abdominal pain, primarily on the right. Vomiting. Four weeks postpartum EXAM: CT ABDOMEN AND PELVIS WITH CONTRAST TECHNIQUE: Multidetector CT imaging of the abdomen and pelvis was performed using the standard protocol following bolus administration of intravenous contrast. CONTRAST:  82m ISOVUE-300 IOPAMIDOL (ISOVUE-300) INJECTION 61% COMPARISON:  CT abdomen and pelvis March 30, 2015 FINDINGS: Lower chest: Lung bases are clear. Hepatobiliary: There is no liver mass or architectural distortion. There is mild periportal edema. Gallbladder wall is not appreciably thickened. There is no biliary duct dilatation. Pancreas: No pancreatic mass or inflammatory focus. Spleen: No focal splenic lesions are evident. Adrenals/Urinary Tract: Adrenals appear normal bilaterally. Kidneys bilaterally show no evident mass or hydronephrosis on either side. There is no renal or ureteral calculus on either side. Urinary bladder is midline with wall thickness within normal limits. Stomach/Bowel: No appreciable bowel wall or mesenteric thickening.  No evident bowel obstruction. No free air or portal venous air. Vascular/Lymphatic: There is no abdominal aortic aneurysm. No vascular lesions are evident. There is no appreciable adenopathy abdomen or pelvis. Reproductive: Uterus is anteverted. Uterus is rather prominent consistent with recent pregnancy. No pelvic mass evident. Other: Appendix appears normal. There is no ascites or abscess in the abdomen or pelvis. There is a rather minimal ventral hernia containing only fat. Musculoskeletal: There are no blastic or lytic bone lesions. There is no intramuscular or  abdominal wall lesion evident. IMPRESSION: 1. Prominent uterus consistent with postpartum state. No focal pelvic mass. 2. Mild periportal edema. Question underlying parenchymal liver disease. Appropriate laboratory correlation advised. No focal liver mass lesion or architectural distortion evident. 3.  No bowel obstruction.  No abscess.  Appendix appears normal. 4. No r show a high area enal or ureteral calculus. No hydronephrosis. 5.  Rather minimal ventral hernia containing only fat. Electronically Signed   By: Lowella Grip III M.D.   On: 11/01/2017 19:12   Mr 3d Recon At Scanner  Result Date: 11/02/2017 CLINICAL DATA:  Epigastric and right upper quadrant abdominal pain elevated D-dimer level. Four weeks post partum. Vomiting and elevated liver enzymes with periportal edema. EXAM: MRI ABDOMEN WITHOUT AND WITH CONTRAST (INCLUDING MRCP) TECHNIQUE: Multiplanar multisequence MR imaging of the abdomen was performed both before and after the administration of intravenous contrast. Heavily T2-weighted images of the biliary and pancreatic ducts were obtained, and three-dimensional MRCP images were rendered by post processing. CONTRAST:  9m MULTIHANCE GADOBENATE DIMEGLUMINE 529 MG/ML IV SOLN COMPARISON:  Multiple exams, including CT and ultrasound exams of 11/01/2017 FINDINGS: Lower chest: Unremarkable Hepatobiliary: On image 7/9 there is high suspicion for a 3 mm stone in the distal common bile duct. Common bile duct caliber 4 mm. A small stone is subtly suggested on image 22/3 has a small signal hypointensity terminating the fluid signal column in the distal CBD. There is some narrowing of the biliary tree at the confluence of the right and left ducts but without intrahepatic biliary dilatation. I do not appreciate a mass lesion or abnormal restriction of diffusion in this vicinity. There is mild abnormal gallbladder wall thickening at 4 mm. Mild periportal edema. No definite gallstones in the gallbladder  itself. No hepatic steatosis or significant abnormal focal hepatic enhancement. Pancreas:  Unremarkable Spleen:  Unremarkable Adrenals/Urinary Tract:  Unremarkable Stomach/Bowel: Unremarkable Vascular/Lymphatic:  Unremarkable Other:  No supplemental non-categorized findings. Musculoskeletal: Unremarkable IMPRESSION: 1. Suspected 3 mm distal CBD stone, best shown on image 7/9. This is a subtle finding that was not appreciated at the preliminary review; I phoned nursing personnel on the clinical team at 7:50 a.m. on 11/02/2017 in order to convey this revised opinion. 2. Subtle narrowing of the biliary tree at the confluence of the right and left bile ducts into the common hepatic duct. I do not see a Klatskin tumor/mass lesion in this vicinity nor is there intrahepatic biliary dilatation. This could reflect a low-grade cholangitis. 3. Gallbladder wall thickening, potentially from inflammation or from the patient's hypoalbuminemia. 4. Nonspecific periportal edema. Electronically Signed   By: WVan ClinesM.D.   On: 11/02/2017 07:58   Dg C-arm 1-60 Min-no Report  Result Date: 11/02/2017 Fluoroscopy was utilized by the requesting physician.  No radiographic interpretation.   Mr Abdomen Mrcp WMoise BoringContast  Result Date: 11/02/2017 CLINICAL DATA:  Epigastric and right upper quadrant abdominal pain elevated D-dimer level. Four weeks post partum. Vomiting and elevated liver enzymes with periportal  edema. EXAM: MRI ABDOMEN WITHOUT AND WITH CONTRAST (INCLUDING MRCP) TECHNIQUE: Multiplanar multisequence MR imaging of the abdomen was performed both before and after the administration of intravenous contrast. Heavily T2-weighted images of the biliary and pancreatic ducts were obtained, and three-dimensional MRCP images were rendered by post processing. CONTRAST:  68m MULTIHANCE GADOBENATE DIMEGLUMINE 529 MG/ML IV SOLN COMPARISON:  Multiple exams, including CT and ultrasound exams of 11/01/2017 FINDINGS: Lower chest:  Unremarkable Hepatobiliary: On image 7/9 there is high suspicion for a 3 mm stone in the distal common bile duct. Common bile duct caliber 4 mm. A small stone is subtly suggested on image 22/3 has a small signal hypointensity terminating the fluid signal column in the distal CBD. There is some narrowing of the biliary tree at the confluence of the right and left ducts but without intrahepatic biliary dilatation. I do not appreciate a mass lesion or abnormal restriction of diffusion in this vicinity. There is mild abnormal gallbladder wall thickening at 4 mm. Mild periportal edema. No definite gallstones in the gallbladder itself. No hepatic steatosis or significant abnormal focal hepatic enhancement. Pancreas:  Unremarkable Spleen:  Unremarkable Adrenals/Urinary Tract:  Unremarkable Stomach/Bowel: Unremarkable Vascular/Lymphatic:  Unremarkable Other:  No supplemental non-categorized findings. Musculoskeletal: Unremarkable IMPRESSION: 1. Suspected 3 mm distal CBD stone, best shown on image 7/9. This is a subtle finding that was not appreciated at the preliminary review; I phoned nursing personnel on the clinical team at 7:50 a.m. on 11/02/2017 in order to convey this revised opinion. 2. Subtle narrowing of the biliary tree at the confluence of the right and left bile ducts into the common hepatic duct. I do not see a Klatskin tumor/mass lesion in this vicinity nor is there intrahepatic biliary dilatation. This could reflect a low-grade cholangitis. 3. Gallbladder wall thickening, potentially from inflammation or from the patient's hypoalbuminemia. 4. Nonspecific periportal edema. Electronically Signed   By: WVan ClinesM.D.   On: 11/02/2017 07:58   Medications: I have reviewed the patient's current medications. Scheduled Meds: . feeding supplement (ENSURE ENLIVE)  237 mL Oral BID BM  . prenatal multivitamin  1 tablet Oral Q1200  . sertraline  25 mg Oral Daily   Continuous Infusions: PRN  Meds:.acetaminophen **OR** acetaminophen, HYDROcodone-acetaminophen, morphine injection, ondansetron **OR** ondansetron (ZOFRAN) IV, polyethylene glycol   Assessment: Active Problems:   Transaminitis   Common bile duct stone   Abnormal liver function tests   Abnormal magnetic resonance imaging of liver  Status post ERCP with sphincterotomy and bile duct stone extraction. LFTs are improving with T bili normalized. There is no evidence of post ERCP pancreatitis  Plan: Cholecystectomy per surgery  GI will sign off, please call [facility] with questions/concerns   LOS: 2 days   Shinichi Anguiano 11/03/2017, 1:19 PM

## 2017-11-03 NOTE — Transfer of Care (Signed)
Immediate Anesthesia Transfer of Care Note  Patient: Hannah Hancock  Procedure(s) Performed: Procedure(s): LAPAROSCOPIC CHOLECYSTECTOMY (N/A)  Patient Location: PACU  Anesthesia Type:General  Level of Consciousness: sedated  Airway & Oxygen Therapy: Patient Spontanous Breathing and Patient connected to face mask oxygen  Post-op Assessment: Report given to RN and Post -op Vital signs reviewed and stable  Post vital signs: Reviewed and stable  Last Vitals:  Vitals:   11/03/17 0505 11/03/17 1506  BP: (!) 102/58 129/76  Pulse: (!) 48 90  Resp: 20 (!) 22  Temp: 36.6 C (!) 36.4 C  SpO2: 98% 100%    Complications: No apparent anesthesia complications

## 2017-11-03 NOTE — Consult Note (Signed)
SURGICAL CONSULTATION NOTE   HISTORY OF PRESENT ILLNESS (HPI):  22 y.o. female presented to [facility] for evaluation of abdominal pain. Patient reports having multiple episodes of epigastric and abdominal pain since few days ago. Pain radiated from epigastric to the Right uppr quadrant. Eating aggravated the pain. Nothing improved the pain.  Initially patient was evaluated due to the pain and was found with normal labs and no stones of ultrasound. Patient was treated with pain medications and discharged. Patient returned since pain was not controlled and new labs found elevated bilirubin and liver enzymes. Patient was admitted by the Hospitalist service and GI was consulted and ERCP was done yesterday. Today patient refers has mild pain, no nausea or vomiting. , Denies fever or chills.  Patient was previously evaluated by Dr. Tamala Julian on January 2019 due to symptomatic cholelithiasis. Since patient was pregnant at that moment it was decided to try wait at least 2 weeks after delivery for cholecystectomy. Patient had an appointment with me on 11/07/17 for evaluation and possible coordination for surgery.   Patient had spontaneous vaginal delivery 4 weeks ago. Refers that has had some post partum depression but currently feel better with medications. Refers no issues during or after delivery.   Surgery is consulted by Dr. Leslye Peer in this context for evaluation and management of choledocholithiasis.  PAST MEDICAL HISTORY (PMH):  Past Medical History:  Diagnosis Date  . Abdominal pain, recurrent   . 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 Anxiety   . Asthma    no meds, no recent attacks  . Gallstones   . Tendinitis    R hip  . Urinary tract infection    Last one 4 months ago  . Weight loss      PAST SURGICAL HISTORY ([place]):  Past Surgical History:  Procedure Laterality Date  . ADENOIDECTOMY    . ESOPHAGOGASTRODUODENOSCOPY  10/20/2011   Procedure: ESOPHAGOGASTRODUODENOSCOPY (EGD);   Surgeon: [name], MD;  Location: [facility];  Service: Gastroenterology;  Laterality: N/A;     MEDICATIONS:  Prior to Admission medications   Medication Sig Start Date End Date Taking? Authorizing Provider  famotidine (PEPCID) 20 MG tablet Take 1 tablet (20 mg total) by mouth 2 (two) times daily. 11/01/17 11/01/18 Yes [name], [name], MD  Multiple Vitamin (MULTIVITAMIN) tablet Take 1 tablet by mouth daily.   Yes [provider]  ondansetron (ZOFRAN ODT) 4 MG disintegrating tablet Take 1 tablet (4 mg total) by mouth every 8 (eight) hours as needed for nausea or vomiting. 11/01/17  Yes [name], [name], MD  sertraline (ZOLOFT) 25 MG tablet Take 25 mg by mouth daily. 10/24/17  Yes [provider]  alum & mag hydroxide-simeth (MAALOX MAX) 400-400-40 MG/5ML suspension Take 5 mLs by mouth every 6 (six) hours as needed for indigestion. 11/01/17   [name], [name], MD  polyethylene glycol [facility] / Floria Raveling) packet Take 17 g by mouth daily as needed.    [provider]  senna-docusate (SENOKOT-S) 8.6-50 MG tablet Take 2 tablets by mouth daily. Patient not taking: Reported on 11/01/2017 10/06/17   [name], [name], MD     ALLERGIES:  No Known Allergies   SOCIAL HISTORY:  Social History   Socioeconomic History  . Marital status: Single    Spouse name: Not on file  . Number of children: Not on file  . Years of education: Not on file  . Highest education level: Not on file  Social Needs  . Financial resource strain: Not on  file  . Food insecurity - worry: Not on file  . Food insecurity - inability: Not on file  . Transportation needs - medical: Not on file  . Transportation needs - non-medical: Not on file  Occupational History  . Not on file  Tobacco Use  . Smoking status: Former Smoker    Packs/day: 0.30    Years: 1.00    Pack years: 0.30    Types: Cigarettes    Last attempt to quit: 05/29/2015    Years since quitting: 2.4  . Smokeless tobacco: Never Used   Substance and Sexual Activity  . Alcohol use: No    Alcohol/week: 0.0 oz  . Drug use: No  . Sexual activity: Not on file  Other Topics Concern  . Not on file  Social History Narrative   10th grade-minimal absenteeism    The patient currently resides (home / rehab facility / nursing home): Home The patient normally is (ambulatory / bedbound): Ambulatory   FAMILY HISTORY:  Family History  Problem Relation Age of Onset  . Cholelithiasis Mother   . Miscarriages / [place] Mother   . Hypertension Maternal Grandmother   . Kidney disease Maternal Grandmother   . Stroke Maternal Grandmother   . Diabetes Paternal Grandfather   . Hypertension Paternal Grandfather      REVIEW OF SYSTEMS:  Constitutional: denies weight loss, fever, chills, or sweats. Positive for malaise and fatigue Eyes: denies any other vision changes, history of eye injury  ENT: denies sore throat, hearing problems  Respiratory: denies shortness of breath, wheezing  Cardiovascular: denies chest pain, palpitations  Gastrointestinal: See HPI Genitourinary: denies burning with urination or urinary frequency Musculoskeletal: denies any other joint pains or cramps  Skin: denies any other rashes or skin discolorations  Neurological: denies any other headache, dizziness. Positive weakness  Psychiatric: positive any other depression, anxiety   All other review of systems were negative   VITAL SIGNS:  Temp:  [97.7 F (36.5 C)-98.4 F (36.9 C)] 97.9 F (36.6 C) (03/16 0505) Pulse Rate:  [48-66] 48 (03/16 0505) Resp:  [14-20] 20 (03/16 0505) BP: (93-121)/(58-83) 102/58 (03/16 0505) SpO2:  [97 %-100 %] 98 % (03/16 0505)     Height: _0  (160 cm) Weight: 69.9 kg (154 lb 1.6 oz) BMI (Calculated): 27.3   PHYSICAL EXAM:  Constitutional:  -- Normal body habitus  -- Awake, alert, and oriented x3  Eyes:  -- Pupils equally round and reactive to light  -- No scleral icterus  Ear, nose, and throat:  -- No jugular  venous distension  Pulmonary:  -- No crackles  -- Equal breath sounds bilaterally -- Breathing non-labored at rest Cardiovascular:  -- S1, S2 present  -- No pericardial rubs Gastrointestinal:  -- Abdomen soft, moderate tender to deep palpation on right upper quadrant, non-distended, no guarding or rebound tenderness -- No abdominal masses appreciated, pulsatile or otherwise  Musculoskeletal and Integumentary:  -- Skin discoloration: None appreciated -- Extremities: B/L UE and LE FROM, hands and feet warm, no edema  Neurologic:  -- Motor function: intact and symmetric -- Sensation: intact and symmetric   Labs:  CBC Latest Ref Rng & Units 11/02/2017 11/01/2017 11/01/2017  WBC 3.6 - 11.0 K/uL 6.6 9.2 10.9  Hemoglobin 12.0 - 16.0 g/dL 10.7(L) 11.9(L) 12.2  Hematocrit 35.0 - 47.0 % 33.2(L) 36.4 37.5  Platelets 150 - 440 K/uL 273 300 289   CMP Latest Ref Rng & Units 11/03/2017 11/02/2017 11/01/2017  Glucose 65 - 99 mg/dL 90 91  110(H)  BUN 6 - 20 mg/dL _0 Creatinine 0.44 - 1.00 mg/dL 0.62 0.66 0.68  Sodium 135 - 145 mmol/L 140 139 138  Potassium 3.5 - 5.1 mmol/L 4.4 3.9 4.2  Chloride 101 - 111 mmol/L 108 109 106  CO2 22 - 32 mmol/L _1 Calcium 8.9 - 10.3 mg/dL 9.3 9.0 9.5  Total Protein 6.5 - 8.1 g/dL 6.8 6.6 7.7  Total Bilirubin 0.3 - 1.2 mg/dL 0.9 2.1(H) 1.6(H)  Alkaline Phos 38 - 126 U/L 332(H) 309(H) 243(H)  AST 15 - 41 U/L 136(H) 427(H) 400(H)  ALT 14 - 54 U/L 255(H) 344(H) 250(H)   Imaging studies:  She has multiple ultrasound, the most recent the stones were not appreciated. There is an ultrasound from July 05, 2017 that show gallstones with mild wall thickening.   ERCP images were personally reviewed showing adequate opacification of CBD, Hepatic ducts and duodenum. Complete removal of stones was achieved.   Assessment/Plan:  22 y.o. female with choledocholithiasis, complicated by pertinent comorbidities including post partum depression. Patient with initial  elevated bilirubin that had successful ERCP yesterday. Today with normal bilirubin level, decreasing AST/ALT with no significant change of Alk Phos. Physical exam show improved pain. Due to the episode of obstructive jaundice due to choledocholithiasis and improving bilirubin and liver enzymes, cholecystectomy is indicated. Patient oriented about diagnosis of choledocoholithasis, the ERCP results, new labs with improved bilirubin and the need for surgery. Surgical techniques were discussed with patient and the benefits and risk of surgery including: common bile duct injury, retained common bile duct stone in need of repeating ERCP, injury to other organs, small bowel obstruction, infection, bleeding, among others.   All of the above findings and recommendations were discussed with the patient, and all of patient's questions were answered to her expressed satisfaction.  Arnold Long, MD

## 2017-11-03 NOTE — Lactation Note (Signed)
Lactation Consultation Note  Patient Name: [facility] ZOXWR'UToday's Date: 11/03/2017   Assisted mom with pumping using [employer] DEBP,  Mom has been exclusively breast feeding.  On the rare occasion that she pumped, she reports expressing approximately 6 oz ( 3 oz from each breast). Mom was concerned that she had been unable to get any milk when pumping earlier.  Explained supply and demand and encouraged to pump every 3 hours when possible.  Discussed pain could have affected milk supply and the fact that using a pump instead of being able to put baby to breast could affect supply.  With warmth and massage, she expressed less than 2 oz.  Discussed with RN the need to save expressed milk.  Mom is taking Norco.  Reviewed and gave information from "Medications and mothers milk" to RN.  Lactation name and number and mother/baby number written on white board and encouraged to call for questions, concerns or assistance.  Maternal Data    Feeding    LATCH Score                   Interventions    Lactation Tools Discussed/Used     Consult Status      [name], [name] 11/03/2017, 8:17 PM

## 2017-11-03 NOTE — Anesthesia Procedure Notes (Signed)

## 2017-11-03 NOTE — Anesthesia Preprocedure Evaluation (Signed)
Anesthesia Evaluation  Patient identified by MRN, date of birth, ID band Patient awake    Reviewed: Allergy & Precautions, NPO status , Patient's Chart, lab work & pertinent test results  History of Anesthesia Complications Negative for: history of anesthetic complications  Airway Mallampati: III  TM Distance: >3 FB Neck ROM: Full    Dental no notable dental hx.    Pulmonary neg sleep apnea, former smoker,    breath sounds clear to auscultation- rhonchi (-) wheezing      Cardiovascular Exercise Tolerance: Good (-) hypertension(-) CAD, (-) Past MI, (-) Cardiac Stents and (-) CABG  Rhythm:Regular Rate:Normal - Systolic murmurs and - Diastolic murmurs    Neuro/Psych PSYCHIATRIC DISORDERS Anxiety negative neurological ROS     GI/Hepatic negative GI ROS, Neg liver ROS,   Endo/Other  negative endocrine ROSneg diabetes  Renal/GU negative Renal ROS     Musculoskeletal negative musculoskeletal ROS (+)   Abdominal (+) - obese,   Peds  Hematology negative hematology ROS (+)   Anesthesia Other Findings Past Medical History: No date: Abdominal pain, recurrent 05/08/2014: ADHD, predominantly inattentive type No date: Anxiety No date: Asthma     Comment:  no meds, no recent attacks No date: Gallstones No date: Tendinitis     Comment:  R hip No date: Urinary tract infection     Comment:  Last one 4 months ago No date: Weight loss   Reproductive/Obstetrics (+) Breast feeding                              Anesthesia Physical Anesthesia Plan  ASA: II  Anesthesia Plan: General   Post-op Pain Management:    Induction: Intravenous  PONV Risk Score and Plan: 2 and Ondansetron, Dexamethasone and Midazolam  Airway Management Planned: Oral ETT  Additional Equipment:   Intra-op Plan:   Post-operative Plan: Extubation in OR  Informed Consent: I have reviewed the patients History and Physical,  chart, labs and discussed the procedure including the risks, benefits and alternatives for the proposed anesthesia with the patient or authorized representative who has indicated his/her understanding and acceptance.   Dental advisory given  Plan Discussed with: CRNA and Anesthesiologist  Anesthesia Plan Comments:         Anesthesia Quick Evaluation

## 2017-11-03 NOTE — Op Note (Signed)
Preoperative diagnosis: Choledocholithiasis  Postoperative diagnosis: Choledocholithiasis.  Procedure: Laparoscopic Cholecystectomy.   Anesthesia: GETA   Surgeon: Dr. Hazle Quantintron Diaz  Wound Classification: Clean Contaminated  Indications: Patient is a 22 y.o. female developed right upper quadrant pain, nausea and vomiting and on workup was found to have obstructive jaundice due to choledocholithiasis. After ERCP and normalization of bilirubin and improvement of abdominal pain, laparoscopic cholecystectomy was elected.  Findings: Critical view of safety achieved Cystic duct and artery identified, ligated and divided Adequate hemostasis  Description of procedure: The patient was placed on the operating table in the supine position. General anesthesia was induced. A time-out was completed verifying correct patient, procedure, site, positioning, and implant(s) and/or special equipment prior to beginning this procedure. An orogastric tube was placed. The abdomen was prepped and draped in the usual sterile fashion.  An incision was made in a natural skin line below the umbilicus.  The fascia was elevated and the Veress needle inserted. Proper position was confirmed by aspiration and saline meniscus test.  The abdomen was insufflated with carbon dioxide to a pressure of 15 mmHg. The patient tolerated insufflation well. A 11-mm trocar was then inserted.  The laparoscope was inserted and the abdomen inspected. No injuries from initial trocar placement were noted. Additional trocars were then inserted in the following locations: a 5-mm trocar in the right epigastrium and two 5-mm trocars along the right costal margin. The abdomen was inspected and no abnormalities were found. The table was placed in the reverse Trendelenburg position with the right side up.  Filmy adhesions between the gallbladder and omentum, duodenum and transverse colon were lysed sharply. The dome of the gallbladder was grasped with  an atraumatic grasper passed through the lateral port and retracted over the dome of the liver. The infundibulum was also grasped with an atraumatic grasper through the midclavicular port and retracted toward the right lower quadrant. This maneuver exposed Calot's triangle. The peritoneum overlying the gallbladder infundibulum was then incised and the cystic duct and cystic artery identified and circumferentially dissected. A time out was performed to review the critical view of safety before ligating the cystic structures. The cystic duct and cystic artery were then doubly clipped and divided close to the gallbladder.  The gallbladder was then dissected from its peritoneal attachments by electrocautery. Hemostasis was checked and the gallbladder and contained stones were removed using an endoscopic retrieval bag placed through the umbilical port. The gallbladder was passed off the table as a specimen. The gallbladder fossa was copiously irrigated with saline and hemostasis was obtained. There was no evidence of bleeding from the gallbladder fossa or cystic artery or leakage of the bile from the cystic duct stump. Secondary trocars were removed under direct vision. No bleeding was noted. The laparoscope was withdrawn and the umbilical trocar removed. The abdomen was allowed to collapse. The fascia of the 11mm trocar sites was closed with figure-of-eight 0 vicryl sutures. The skin was closed with subcuticular sutures of 4-0 monocryl and topical skin adhesive. The orogastric tube was removed.  The patient tolerated the procedure well and was taken to the postanesthesia care unit in stable condition.   Specimen: Gallbladder  Complications: None  EBL: 10mL

## 2017-11-03 NOTE — Anesthesia Postprocedure Evaluation (Signed)
Anesthesia Post Note  Patient: Modesta MessingChasity D Bistline  Procedure(s) Performed: LAPAROSCOPIC CHOLECYSTECTOMY (N/A )  Patient location during evaluation: PACU Anesthesia Type: General Level of consciousness: awake and alert and oriented Pain management: pain level controlled Vital Signs Assessment: post-procedure vital signs reviewed and stable Respiratory status: spontaneous breathing, nonlabored ventilation and respiratory function stable Cardiovascular status: blood pressure returned to baseline and stable Postop Assessment: no signs of nausea or vomiting Anesthetic complications: no     Last Vitals:  Vitals:   11/03/17 1647 11/03/17 1749  BP: 130/80 129/72  Pulse: 71 81  Resp: 16 12  Temp: 36.8 C 36.9 C  SpO2: 100% 99%    Last Pain:  Vitals:   11/03/17 1843  TempSrc:   PainSc: Asleep                 Evelyn Moch

## 2017-11-03 NOTE — Anesthesia Post-op Follow-up Note (Signed)
Anesthesia QCDR form completed.        

## 2017-11-03 NOTE — Brief Op Note (Signed)
11/03/2017  3:10 PM  PATIENT:  [facility]  22 y.o. female  PRE-OPERATIVE DIAGNOSIS:  choledocholithiasis  POST-OPERATIVE DIAGNOSIS:  same  PROCEDURE:  Procedure(s): LAPAROSCOPIC CHOLECYSTECTOMY (N/A)  SURGEON:  Surgeon(s) and Role:    * Carolan Shiverintron-Diaz, Leslie Langille, MD - Primary  PHYSICIAN ASSISTANT: none   ASSISTANTS: none   ANESTHESIA:   general  EBL:  10   BLOOD ADMINISTERED:none  DRAINS: none   LOCAL MEDICATIONS USED:  BUPIVICAINE   SPECIMEN:  Source of Specimen:  Gallbladder  DISPOSITION OF SPECIMEN:  PATHOLOGY  COUNTS:  YES  TOURNIQUET:  * No tourniquets in log *  DICTATION: .Note written in EPIC  PLAN OF CARE: stay overnight for assessment if patient tolerates diet and pain control. New labs in the morning. Possible discharege 11/04/17  PATIENT DISPOSITION:  PACU - hemodynamically stable.   Delay start of Pharmacological VTE agent (>24hrs) due to surgical blood loss or risk of bleeding: no

## 2017-11-03 NOTE — Progress Notes (Signed)
15 minute call to floor. 

## 2017-11-03 NOTE — Plan of Care (Signed)
Pt has had no complaints of pain. Plan is to go for lap choley today

## 2017-11-04 ENCOUNTER — Encounter: Payer: Self-pay | Admitting: General Surgery

## 2017-11-04 LAB — COMPREHENSIVE METABOLIC PANEL
ALT: 161 U/L — AB (ref 14–54)
AST: 59 U/L — AB (ref 15–41)
Albumin: 3.3 g/dL — ABNORMAL LOW (ref 3.5–5.0)
Alkaline Phosphatase: 246 U/L — ABNORMAL HIGH (ref 38–126)
Anion gap: 10 (ref 5–15)
BUN: 16 mg/dL (ref 6–20)
CHLORIDE: 107 mmol/L (ref 101–111)
CO2: 23 mmol/L (ref 22–32)
CREATININE: 0.58 mg/dL (ref 0.44–1.00)
Calcium: 9.1 mg/dL (ref 8.9–10.3)
GFR calc Af Amer: >60 mL/min (ref >60)
Glucose, Bld: 83 mg/dL (ref 65–99)
Potassium: 3.9 mmol/L (ref 3.5–5.1)
SODIUM: 140 mmol/L (ref 135–145)
Total Bilirubin: 0.7 mg/dL (ref 0.3–1.2)
Total Protein: 6.6 g/dL (ref 6.5–8.1)

## 2017-11-04 MED ORDER — HYDROCODONE-ACETAMINOPHEN 5-325 MG PO TABS: 1.0000 | ORAL_TABLET | ORAL | 0 refills | Status: DC | PRN

## 2017-11-04 NOTE — Progress Notes (Signed)
[facility] to [facility] D/C'd per MD order.  Discussed prescriptions and follow up appointments with the patient. Prescriptions given to patient, medication list explained in detail. Pt verbalized understanding.  Allergies as of 11/04/2017   No Known Allergies     Medication List    TAKE these medications   alum & mag hydroxide-simeth 400-400-40 MG/5ML suspension Commonly known as:  MAALOX MAX Take 5 mLs by mouth every 6 (six) hours as needed for indigestion.   famotidine 20 MG tablet Commonly known as:  PEPCID Take 1 tablet (20 mg total) by mouth 2 (two) times daily.   HYDROcodone-acetaminophen 5-325 MG tablet Commonly known as:  NORCO/VICODIN Take 1 tablet by mouth every 4 (four) hours as needed for moderate pain.   multivitamin tablet Take 1 tablet by mouth daily.   ondansetron 4 MG disintegrating tablet Commonly known as:  ZOFRAN ODT Take 1 tablet (4 mg total) by mouth every 8 (eight) hours as needed for nausea or vomiting.   polyethylene glycol packet Commonly known as:  MIRALAX / GLYCOLAX Take 17 g by mouth daily as needed.   senna-docusate 8.6-50 MG tablet Commonly known as:  Senokot-S Take 2 tablets by mouth daily.   sertraline 25 MG tablet Commonly known as:  ZOLOFT Take 25 mg by mouth daily.       Vitals:   11/03/17 2114 11/04/17 0510  BP: 119/63 115/73  Pulse: 73 (!) 58  Resp: 18 16  Temp: 99.3 F (37.4 C) 98.2 F (36.8 C)  SpO2: 99% 99%    Skin clean, dry and intact without evidence of skin break down, no evidence of skin tears noted. IV catheter discontinued intact. Site without signs and symptoms of complications. Dressing and pressure applied. Pt denies pain at this time. No complaints noted.  An After Visit Summary was printed and given to the patient. Patient escorted via WC, and D/C home via private auto.  [facility] A

## 2017-11-04 NOTE — Final Progress Note (Signed)
AVSS. Tolerating full liquids well. No nausea. Ambulating. Sore more than pain. Lungs: Clear. Cardio: RR. ABD: Non-distended,soft. Wounds: Clean. Labs: Transaminases continue to fall.  IMP: Doing well. Plan: Discharge home.  Reviewed activity. OTC meds for soreness. Help with lifting baby first few days. Heating pad for comfort. OK to shower. RX for Norco (5/325), #12 sent to pharmacy.  Patient to contact Dr. Drexel Ihaitron-Diaz's office for appointment the week of March 25th.

## 2017-11-04 NOTE — Discharge Summary (Signed)
[employer] - [place] [facility]   PATIENT NAME: Hannah Hancock    MR#:  [id number]  DATE OF BIRTH:  February 25, 1996  DATE OF ADMISSION:  11/01/2017 ADMITTING PHYSICIAN: Milagros Loll, MD  DATE OF DISCHARGE: 11/04/2017  PRIMARY CARE PHYSICIAN: Leotis Shames, MD    ADMISSION DIAGNOSIS:  Hyperbilirubinemia [E80.6] Transaminitis [R74.0] Pain of upper abdomen [R10.10]  DISCHARGE DIAGNOSIS:  Active Problems:   Transaminitis   Common bile duct stone   Abnormal liver function tests   Abnormal magnetic resonance imaging of liver   SECONDARY DIAGNOSIS:   Past Medical History:  Diagnosis Date  . Abdominal pain, recurrent   . 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 Anxiety   . Asthma    no meds, no recent attacks  . Gallstones   . Tendinitis    R hip  . Urinary tract infection    Last one 4 months ago  . Weight loss     HOSPITAL COURSE:   1.  Choledocholithiasis, elevated liver function test and gallbladder wall thickening seen on MRCP.  The patient had an ERCP with stone removal by Dr. Servando Snare gastroenterology on 11/02/2017.  Patient had cholecystectomy done by Dr. Hazle Quant on 11/03/2017.  Please see operative reports for both of those procedures.  Patient was seen by surgery on the day of discharge and cleared for going home.  Liver function tests trended better during the hospital course.  AST down to 59 and ALT down to 161.  Alkaline phosphatase down to 246.  Bilirubin down to 0.7.  On presentation alkaline phosphatase was 243, AST 400 ALT 250 and total bilirubin 1.6.  The liver function tests went up the next day and then trended better after procedures.  Patient was prescribed pain medication by surgery on the day of discharge.  I advised that any medications can get in the breast milk so the quicker that she can get over to Tylenol or ibuprofen the better. 2.  Depression on Zoloft  DISCHARGE CONDITIONS:   Satisfactory  CONSULTS OBTAINED:   Treatment Team:  Midge Minium, MD Carolan Shiver, MD Earline Mayotte, MD  DRUG ALLERGIES:  No Known Allergies  DISCHARGE MEDICATIONS:   Allergies as of 11/04/2017   No Known Allergies     Medication List    TAKE these medications   alum & mag hydroxide-simeth 400-400-40 MG/5ML suspension Commonly known as:  MAALOX MAX Take 5 mLs by mouth every 6 (six) hours as needed for indigestion.   famotidine 20 MG tablet Commonly known as:  PEPCID Take 1 tablet (20 mg total) by mouth 2 (two) times daily.   HYDROcodone-acetaminophen 5-325 MG tablet Commonly known as:  NORCO/VICODIN Take 1 tablet by mouth every 4 (four) hours as needed for moderate pain.   multivitamin tablet Take 1 tablet by mouth daily.   ondansetron 4 MG disintegrating tablet Commonly known as:  ZOFRAN ODT Take 1 tablet (4 mg total) by mouth every 8 (eight) hours as needed for nausea or vomiting.   polyethylene glycol packet Commonly known as:  MIRALAX / GLYCOLAX Take 17 g by mouth daily as needed.   senna-docusate 8.6-50 MG tablet Commonly known as:  Senokot-S Take 2 tablets by mouth daily.   sertraline 25 MG tablet Commonly known as:  ZOLOFT Take 25 mg by mouth daily.        DISCHARGE INSTRUCTIONS:   General surgery in 1 week Follow-up with PMD 1 week If you experience worsening of your admission symptoms, develop shortness  of breath, life threatening emergency, suicidal or homicidal thoughts you must seek medical attention immediately by calling 911 or calling your MD immediately  if symptoms less severe.  You Must read complete instructions/literature along with all the possible adverse reactions/side effects for all the Medicines you take and that have been prescribed to you. Take any new Medicines after you have completely understood and accept all the possible adverse reactions/side effects.   Please note  You were cared for by a hospitalist during your hospital stay. If you have  any questions about your discharge medications or the care you received while you were in the hospital after you are discharged, you can call the unit and asked to speak with the hospitalist on call if the hospitalist that took care of you is not available. Once you are discharged, your primary care physician will handle any further medical issues. Please note that NO REFILLS for any discharge medications will be authorized once you are discharged, as it is imperative that you return to your primary care physician (or establish a relationship with a primary care physician if you do not have one) for your aftercare needs so that they can reassess your need for medications and monitor your lab values.    Today   CHIEF COMPLAINT:   Chief Complaint  Patient presents with  . Abdominal Pain    HISTORY OF PRESENT ILLNESS:  Hannah Hancock  is a 22 y.o. female came in with abdominal pain   VITAL SIGNS:  Blood pressure 115/73, pulse (!) 58, temperature 98.2 F (36.8 C), temperature source Oral, resp. rate 16, height 5\' 3"  (1.6 m), weight 69.9 kg (154 lb 1.6 oz), SpO2 99 %, currently breastfeeding.    PHYSICAL EXAMINATION:  GENERAL:  22 y.o.-year-old patient lying in the bed with no acute distress.  EYES: Pupils equal, round, reactive to light and accommodation. No scleral icterus. Extraocular muscles intact.  HEENT: Head atraumatic, normocephalic. Oropharynx and nasopharynx clear.  NECK:  Supple, no jugular venous distention. No thyroid enlargement, no tenderness.  LUNGS: Normal breath sounds bilaterally, no wheezing, rales,rhonchi or crepitation. No use of accessory muscles of respiration.  CARDIOVASCULAR: S1, S2 normal. No murmurs, rubs, or gallops.  ABDOMEN: Soft, tender, non-distended. Bowel sounds present. No organomegaly or mass.  EXTREMITIES: No pedal edema, cyanosis, or clubbing.  NEUROLOGIC: Cranial nerves II through XII are intact. Muscle strength 5/5 in all extremities. Sensation  intact. Gait not checked.  PSYCHIATRIC: The patient is alert and oriented x 3.  SKIN: No obvious rash, lesion, or ulcer.   DATA REVIEW:   CBC Recent Labs  Lab 11/02/17 0557  WBC 6.6  HGB 10.7*  HCT 33.2*  PLT 273    Chemistries  Recent Labs  Lab 11/04/17 0438  NA 140  K 3.9  CL 107  CO2 23  GLUCOSE 83  BUN 16  CREATININE 0.58  CALCIUM 9.1  AST 59*  ALT 161*  ALKPHOS 246*  BILITOT 0.7    Cardiac Enzymes Recent Labs  Lab 11/01/17 1804  TROPONINI <0.03     Management plans discussed with the patient, in general surgery and she is in agreement.  CODE STATUS:     Code Status Orders  (From admission, onward)        Start     Ordered   11/01/17 1955  Full code  Continuous     11/01/17 1955    Code Status History    Date Active Date Inactive Code Status Order ID  Comments User Context   10/04/2017 23:07 10/06/2017 20:10 Full Code [id number]  Genia DelHaviland, Margaret, CNM Inpatient   10/03/2017 15:39 10/04/2017 23:07 Full Code [id number]  Randa NgoMcVey, Rebecca A, CNM Inpatient   10/03/2017 14:38 10/03/2017 15:39 Full Code [id number]  Randa NgoMcVey, Rebecca A, CNM Inpatient   06/28/2017 02:03 06/28/2017 06:23 Full Code [id number]  Sharee PimpleJones, Caron W, CNM Inpatient      TOTAL TIME TAKING CARE OF THIS PATIENT: 31 minutes.    Alford Highlandichard Dianah Pruett M.D on 11/04/2017 at 8:53 AM  Between 7am to 6pm - Pager - [phone]  After 6pm go to [email or web address] - password [email or web address]  [employer] [facility] Office  [phone]  CC: Primary care physician; Leotis ShamesSingh, Jasmine, MD

## 2017-11-05 ENCOUNTER — Encounter: Payer: Self-pay | Admitting: Gastroenterology

## 2017-11-05 NOTE — Anesthesia Postprocedure Evaluation (Signed)
Anesthesia Post Note  Patient: Hannah Hancock  Procedure(s) Performed: ENDOSCOPIC RETROGRADE CHOLANGIOPANCREATOGRAPHY (ERCP) WITH PROPOFOL (N/A )  Patient location during evaluation: Endoscopy Anesthesia Type: General Level of consciousness: awake and alert Pain management: pain level controlled Vital Signs Assessment: post-procedure vital signs reviewed and stable Respiratory status: spontaneous breathing, nonlabored ventilation and respiratory function stable Cardiovascular status: blood pressure returned to baseline and stable Postop Assessment: no apparent nausea or vomiting Anesthetic complications: no     Last Vitals:  Vitals:   11/03/17 2114 11/04/17 0510  BP: 119/63 115/73  Pulse: 73 (!) 58  Resp: 18 16  Temp: 37.4 C 36.8 C  SpO2: 99% 99%    Last Pain:  Vitals:   11/04/17 0927  TempSrc:   PainSc: 3                  Levander Katzenstein Garry Heater Giovannina Mun

## 2017-11-06 LAB — SURGICAL PATHOLOGY

## 2017-11-08 ENCOUNTER — Telehealth: Payer: Self-pay

## 2017-11-08 NOTE — Telephone Encounter (Signed)
Flagged on EMMI report for not taking medications.  Called and spoke with patient.  She reports that she has not been taking her pain med because she is breastfeeding and told she couldn't take it if she was.   She mentioned she's been taking tylenol and hasn't had any issues.  Thanked patient for her time and informed her that she would likely receive one more automated call in the next few days checking in on her.

## 2017-11-28 ENCOUNTER — Ambulatory Visit: Payer: BLUE CROSS/BLUE SHIELD | Attending: Certified Nurse Midwife

## 2017-11-28 ENCOUNTER — Other Ambulatory Visit: Payer: Self-pay

## 2017-11-28 DIAGNOSIS — K5902 Outlet dysfunction constipation: Secondary | ICD-10-CM | POA: Diagnosis present

## 2017-11-28 DIAGNOSIS — G8929 Other chronic pain: Secondary | ICD-10-CM | POA: Insufficient documentation

## 2017-11-28 DIAGNOSIS — M545 Low back pain, unspecified: Secondary | ICD-10-CM

## 2017-11-28 DIAGNOSIS — M62838 Other muscle spasm: Secondary | ICD-10-CM | POA: Insufficient documentation

## 2017-11-28 DIAGNOSIS — M6208 Separation of muscle (nontraumatic), other site: Secondary | ICD-10-CM | POA: Diagnosis present

## 2017-11-28 NOTE — Patient Instructions (Signed)
Diastasis Recti Correction With Hands (Hook-Lying)    Spread hands wide on lower abdomen. Inhale. In one fluid movement: Pull in navel, push abdomen muscles together, exhale, and Hold for _2__ seconds. Return, rest for _1__ seconds. Repeat _10x2__ times. Do _1-2__ times a day.  Child's Pose Pelvic Floor Lengthening    Sit in knee-chest position and reach arms forward. Separate knees for comfort. Hold position for _5__ breaths. Repeat _2-3__ times. Do _1-2__ times per day.

## 2017-11-28 NOTE — Therapy (Signed)
[place] [facility] MAIN [facility] SERVICES [address] [place], [name], [address] Phone: [phone]   Fax:  [phone]  Physical Therapy Evaluation  Patient Details  Name: Hannah Hancock MRN: [id number] Date of Birth: March 10, 1996 Referring Provider: Genia Del   Encounter Date: 11/28/2017  PT End of Session - 11/29/17 1016    Visit Number  1    Number of Visits  12    Date for PT Re-Evaluation  01/02/18    Authorization Type  Medicaid    Authorization Time Period  11/28/2017- 01/02/2018    Authorization - Visit Number  1    Authorization - Number of Visits  4    PT Start Time  1540    PT Stop Time  1633    PT Time Calculation (min)  53 min    Activity Tolerance  Patient tolerated treatment well    Behavior During Therapy  [facility] for tasks assessed/performed       Past Medical History:  Diagnosis Date  . Abdominal pain, recurrent   . 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 Anxiety   . Asthma    no meds, no recent attacks  . Gallstones   . Tendinitis    R hip  . Urinary tract infection    Last one 4 months ago  . Weight loss     Past Surgical History:  Procedure Laterality Date  . ADENOIDECTOMY    . CHOLECYSTECTOMY N/A 11/03/2017   Procedure: LAPAROSCOPIC CHOLECYSTECTOMY;  Surgeon: Carolan Shiver, MD;  Location: [facility] ORS;  Service: General;  Laterality: N/A;  . ENDOSCOPIC RETROGRADE CHOLANGIOPANCREATOGRAPHY (ERCP) WITH PROPOFOL N/A 11/02/2017   Procedure: ENDOSCOPIC RETROGRADE CHOLANGIOPANCREATOGRAPHY (ERCP) WITH PROPOFOL;  Surgeon: Midge Minium, MD;  Location: [facility] ENDOSCOPY;  Service: Endoscopy;  Laterality: N/A;  . ESOPHAGOGASTRODUODENOSCOPY  10/20/2011   Procedure: ESOPHAGOGASTRODUODENOSCOPY (EGD);  Surgeon: Jon Gills, MD;  Location: [facility] OR;  Service: Gastroenterology;  Laterality: N/A;    There were no vitals filed for this visit.  Pelvic Floor Physical Therapy Evaluation and  Assessment  SCREENING  Falls in last 6 mo: no    Patient's communication preference:   Red Flags:  Have you had any night sweats? Yes,sometimes, hormonal following delivery.  Unexplained weight loss? no Saddle anesthesia? no Unexplained changes in bowel or bladder habits? no   SUBJECTIVE  Patient reports: Had grade 3 tearing with delivery of her daughter, Ivor Messier, in February. Started having urinary leakage then and can feel PFM spasm when moving/ not trying to engage the pelvic floor muscles. Had Frequent UTI's prior to pregnancy.   Has low back pain, it feels worse when standing for extended period of time (greater than an hour).  Precautions:  Breast feeding  Social/Family/Vocational History:   On maternity leave, desk job.  Recent Procedures/Tests/Findings:  Had Cholecystectomy last month.   Obstetrical History: G1P1 Vaginal delivery, grade 3 tearing  Gynecological History: Prone to UTI, yeast infections. Had up to 12 in a year.  Urinary History: Can feel burning/urge to urinate as if UTI after urination sometimes. Urinates ~ every 3 hours. Drinking ~4 cups of water, 1-2 bottles of water and juice.   Gastrointestinal History: Cant go as "much now " is having 1-2 BM's a week.   Sexual activity/pain: No pain with intercourse prior to delivery, has not tried since. Has had vaginismus/ trouble "relaxing" with intercourse.   Location of pain: Low back  Current pain:  2/10  Max pain:  5/10 Least pain:  2/10 Nature of pain: achy  Patient Goals: Decrease Urinary leakage, back pain,a and improve relaxation for intercourse.   OBJECTIVE  Posture/Observations:  Sitting:  Standing: anterior pelvic tilt/reduced TA activation, forward shoulders and mildly forward head. R border of sacrum higher than L border, very  Mild pelvic obliquity.  Palpation/Segmental Motion/Joint Play: TTP through R>L SIJ, glute med, piriformis.   Special tests:   Stork: positive for  instability on R, Decreased mobility on L.  Range of Motion/Flexibilty: Deferred to next visit due to time constraint. Spine: Hips:   Strength/MMT: Deferred to next visit due to time constraint. LE MMT  LE MMT Left Right  Hip flex:  (L2) /5 /5  Hip ext: /5 /5  Hip abd: /5 /5  Hip add: /5 /5  Hip IR /5 /5  Hip ER /5 /5     Abdominal:  Palpation: TTP through RLQ, LLQ, midline under sternum, and Psoas on L>R Diastasis: 2 finger above the umbilicus  Pelvic Floor External Exam: Deferred to next visit due to time constraint. Introitus Appears:  Skin integrity:  Palpation: Cough: Prolapse visible?: Scar mobility:  Internal Vaginal Exam: Deferred to next visit due to time constraint. Strength (PERF):  Symmetry: Palpation: Prolapse:  Gait Analysis: Deferred to next visit due to time constraint.   Pelvic Floor Outcome Measures:  PFDI: 47/300, PFIQ: 22/300  Interventions this session: Self-care: Educated on the structure and function of the pelvic floor in relation to their symptoms as well as the POC, and initial HEP in order to set patient expectations and understanding from which we will build on in the future sessions. Therex: Educated Pt. On TA contraction with approximation of rectus abdominus to decrease diastasis and child's pose to decrease resting tension of the PFM.   Total time: 53 min.                 Objective measurements completed on examination: See above findings.              PT Education - 11/29/17 1016    Education provided  Yes    Education Details  See Pt. Instructions and Interventions this session    Person(s) Educated  Patient    Methods  Explanation    Comprehension  Verbalized understanding;Returned demonstration       PT Short Term Goals - 11/29/17 1049      PT SHORT TERM GOAL #1   Title  Patient will demonstrate a coordinated contraction, relaxation, and bulge of the pelvic floor muscles to demonstrate  functional recruitment and motion and allow for further strengthening.    Time  6    Period  Weeks    Status  New    Target Date  01/10/18      PT SHORT TERM GOAL #2   Title  Patient will demonstrate improved pelvic alignment and balance of musculature surrounding the pelvis to facilitate decreased PFM spasms and decrease pelvic pain.    Time  3    Period  Weeks    Status  New    Target Date  12/20/17      PT SHORT TERM GOAL #3   Title  Patient will report consistent use of foot-stool (squatty-potty) for positioning with BM to decrease pain with BM and intra-abdominal pressure.    Time  3    Period  Weeks    Status  New    Target Date  12/20/17      PT SHORT TERM GOAL #4  Title  Patient will demonstrate HEP x1 in the clinic to demonstrate understanding and proper form to allow for further improvement.    Time  3    Period  Weeks    Status  New    Target Date  12/20/17        PT Long Term Goals - 11/29/17 1053      PT LONG TERM GOAL #1   Title  Patient will report no episodes of SUI over the course of the prior two weeks to demonstrate improved functional ability.    Time  12    Period  Weeks    Status  New    Target Date  02/21/18      PT LONG TERM GOAL #2   Title  Patient will score at or below 2/300 on the PFDI and 0/300 on the PFIQ to demonstrate a clinically meaningful decrease in disability and distress due to pelvic floor dysfunction.    Baseline  PFDI:47/300, PFIQ: 22/300    Time  12    Period  Weeks    Status  New    Target Date  02/21/18      PT LONG TERM GOAL #3   Title  Patient will report having BM's at least every-other day with consistency between [facility] stool scale 3-5 over the prior week to demonstrate decreased constipation.    Time  12    Period  Weeks    Status  New    Target Date  02/21/18      PT LONG TERM GOAL #4   Title  Patient will describe ability to have intercourse without vaginismus/penetration difficulty to demonstrate improved  PFM relaxation and improved QOL.    Time  12    Period  Weeks    Status  New    Target Date  02/21/18      PT LONG TERM GOAL #5   Title  Patient will be able to stand or walf for greater than 2 hours without an increase in LBP greater than 2/10 to demonstrate improved quality of life and functional ability.    Baseline  1 hour of standing or walking increases pain to highest level (5/10)    Time  12    Period  Weeks    Status  New    Target Date  02/21/18             Plan - 11/29/17 1020    Clinical Impression Statement  Pt. is a 22 y/o female who is G1P1 and presents today with cheif c/o urinary incontinence and pelvic floor spasms following vaginal delivery of her daugheter with grade 3 tearing. Pt. has [facility] for recent cholycystectomy (s/p 1 month), chronic constipation, LBP, and frequent UTI's and feeling of burning with urination outside of the setting of a UTI intermittently. Her clinical exam revealed spasm of muscles surrounding the pelvis, diastasis recti, poor posture and body mechanics/awareness, pelvic malalignment and instability, and Pt. reports many symptoms that correlate with pelvic floor spasms though internal exam was deferred due to time constraint. She will benefit from skilled pelvic PT to address the noted deficits and continue to assess for contributing factors.    History and Personal Factors relevant to plan of care:  On Maternity leave currently, recently had cholycystectomy, grade 3 tearing.     Clinical Presentation  Stable    Clinical Presentation due to:  Postpartum, currently breastfeeding. istory of chronic constipation, vaginismus, and recurrent UTI's    Clinical  Decision Making  Moderate    Rehab Potential  Good    Clinical Impairments Affecting Rehab Potential  Grade 3 tearing with delivery, diastasis recti, recent abdominal surgery    PT Frequency  1x / week    PT Duration  12 weeks    PT Treatment/Interventions  Biofeedback;Electrical  Stimulation;Traction;Moist Heat;Functional mobility training;Therapeutic activities;Therapeutic exercise;Patient/family education;Neuromuscular re-education;Manual techniques;Scar mobilization;Energy conservation;Dry needling;Passive range of motion    PT Next Visit Plan  assess PFM and perform PA to sacrum and TP release to abdomen for improved alignment. add 3-way wall stretch    PT Home Exercise Plan  Child's pose, diastasis approximation    Consulted and Agree with Plan of Care  Patient       Patient will benefit from skilled therapeutic intervention in order to improve the following deficits and impairments:  Decreased skin integrity, Increased fascial restricitons, Impaired sensation, Improper body mechanics, Pain, Decreased coordination, Decreased scar mobility, Increased muscle spasms, Impaired tone, Postural dysfunction, Decreased activity tolerance, Decreased range of motion, Decreased strength, Decreased balance, Difficulty walking  Visit Diagnosis: Other muscle spasm  Chronic midline low back pain without sciatica  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 Common bile duct stone   . Abnormal liver function tests   . Abnormal magnetic resonance imaging of liver   . Transaminitis 11/01/2017  . Third degree laceration of perineum during delivery, postpartum 10/06/2017  . Gestational hypertension 10/03/2017  . Abdominal pain affecting pregnancy 06/28/2017  . 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 Presence of subdermal contraceptive device 04/07/2014  . Sleep disturbance 11/03/2013  . Adjustment disorder with mixed anxiety and depressed mood 08/12/2013   Cleophus MoltKeeli T. Gailes DPT, ATC Cleophus MoltKeeli T Gailes 11/29/2017, 6:06 PM  [facility] [facility] [address] [place], [place], [id number] Phone: [phone]   Fax:  [phone]  Name:  Hannah Hancock MRN: [id number] Date of Birth: 12/31/1995

## 2017-11-28 NOTE — Therapy (Deleted)
[place] [facility]IN [facility] [address] [place], [place], [address] Phone: [phone]   Fax:  [phone]  Physical Therapy Treatment  Patient Details  Name: Hannah MessingChasity D Hancock MRN: [id number] Date of Birth: 11/17/1995 Referring Provider: Genia DelHaviland, Margaret   Encounter Date: 11/28/2017    Past Medical History:  Diagnosis Date  . Abdominal pain, recurrent   . 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 Anxiety   . Asthma    no meds, no recent attacks  . Gallstones   . Tendinitis    R hip  . Urinary tract infection    Last one 4 months ago  . Weight loss     Past Surgical History:  Procedure Laterality Date  . ADENOIDECTOMY    . CHOLECYSTECTOMY N/A 11/03/2017   Procedure: LAPAROSCOPIC CHOLECYSTECTOMY;  Surgeon: Carolan Shiverintron-Diaz, Edgardo, MD;  Location: [facility] ORS;  Service: General;  Laterality: N/A;  . ENDOSCOPIC RETROGRADE CHOLANGIOPANCREATOGRAPHY (ERCP) WITH PROPOFOL N/A 11/02/2017   Procedure: ENDOSCOPIC RETROGRADE CHOLANGIOPANCREATOGRAPHY (ERCP) WITH PROPOFOL;  Surgeon: Midge MiniumWohl, Darren, MD;  Location: [facility] ENDOSCOPY;  Service: Endoscopy;  Laterality: N/A;  . ESOPHAGOGASTRODUODENOSCOPY  10/20/2011   Procedure: ESOPHAGOGASTRODUODENOSCOPY (EGD);  Surgeon: Jon GillsJoseph H. Clark, MD;  [facility];  Service: Gastroenterology;  Laterality: N/A;    There were no vitals filed for this visit.      [facility] - 11/28/17 0001      Assessment   Medical Diagnosis  Urinary Incontinence    Referring Provider  Genia DelHaviland, Margaret    Onset Date/Surgical Date  09/30/17    Prior Therapy  no      Precautions   Precautions  None      Restrictions   Weight Bearing Restrictions  No      Balance Screen   Has the patient fallen in the past 6 months  No      Home Environment   Living Environment  Private residence    Living Arrangements  Spouse/significant other;Children    Type of Home  Mobile home    Home Access  Stairs to enter    Entrance Stairs-Number of Steps  6      Prior Function   Level of Independence  Independent    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     Problem List Patient Active Problem List   Diagnosis Date Noted  . Common bile duct stone   . Abnormal liver function tests   . Abnormal magnetic resonance imaging of liver   . Transaminitis 11/01/2017  . Third degree laceration of perineum during delivery, postpartum 10/06/2017  . Gestational hypertension 10/03/2017  . Abdominal pain affecting pregnancy 06/28/2017  . 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 Presence of subdermal contraceptive device 04/07/2014  . Sleep disturbance 11/03/2013  . Adjustment disorder with mixed anxiety and depressed mood 08/12/2013    Hannah Hancock 11/28/2017, 3:59 PM  [place] [facility] [address] [place], [place], [address] Phone: [phone]   Fax:  [phone]  Name: Hannah Hancock MRN: [id number] Date of Birth: 02/21/1996

## 2017-12-05 ENCOUNTER — Ambulatory Visit: Payer: BLUE CROSS/BLUE SHIELD

## 2017-12-05 DIAGNOSIS — G8929 Other chronic pain: Secondary | ICD-10-CM

## 2017-12-05 DIAGNOSIS — K5902 Outlet dysfunction constipation: Secondary | ICD-10-CM

## 2017-12-05 DIAGNOSIS — M6208 Separation of muscle (nontraumatic), other site: Secondary | ICD-10-CM

## 2017-12-05 DIAGNOSIS — M62838 Other muscle spasm: Secondary | ICD-10-CM

## 2017-12-05 DIAGNOSIS — M545 Low back pain: Secondary | ICD-10-CM

## 2017-12-05 NOTE — Therapy (Signed)
[facility] MAIN [facility] SERVICES [address] [place], [name], [address] Phone: [phone]   Fax:  [phone]  Physical Therapy Treatment  Patient Details  Name: Hannah Hancock MRN: [id number] Date of Birth: 1995/09/17 Referring Provider: Genia Del   Encounter Date: 12/05/2017  PT End of Session - 12/05/17 2050    Visit Number  2    Number of Visits  12    Date for PT Re-Evaluation  01/02/18    Authorization Type  Medicaid    Authorization Time Period  11/28/2017- 01/02/2018    Authorization - Visit Number  2    Authorization - Number of Visits  4    PT Start Time  1435    PT Stop Time  1538    PT Time Calculation (min)  63 min    Activity Tolerance  Patient tolerated treatment well    Behavior During Therapy  [facility] for tasks assessed/performed       Past Medical History:  Diagnosis Date  . Abdominal pain, recurrent   . 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 Anxiety   . Asthma    no meds, no recent attacks  . Gallstones   . Tendinitis    R hip  . Urinary tract infection    Last one 4 months ago  . Weight loss     Past Surgical History:  Procedure Laterality Date  . ADENOIDECTOMY    . CHOLECYSTECTOMY N/A 11/03/2017   Procedure: LAPAROSCOPIC CHOLECYSTECTOMY;  Surgeon: Carolan Shiver, MD;  Location: [facility] ORS;  Service: General;  Laterality: N/A;  . ENDOSCOPIC RETROGRADE CHOLANGIOPANCREATOGRAPHY (ERCP) WITH PROPOFOL N/A 11/02/2017   Procedure: ENDOSCOPIC RETROGRADE CHOLANGIOPANCREATOGRAPHY (ERCP) WITH PROPOFOL;  Surgeon: Midge Minium, MD;  Location: [facility] ENDOSCOPY;  Service: Endoscopy;  Laterality: N/A;  . ESOPHAGOGASTRODUODENOSCOPY  10/20/2011   Procedure: ESOPHAGOGASTRODUODENOSCOPY (EGD);  Surgeon: Jon Gills, MD;  Location: [facility] OR;  Service: Gastroenterology;  Laterality: N/A;    There were no vitals filed for this visit.   Pelvic Floor Physical Therapy Treatment Note  SCREENING  Changes in  medications, allergies, or medical history?: no     SUBJECTIVE  Patient reports: She feels about the same, no change in sx. She accidentally got her baby's sleep schedule off last night and so she is tired today.   Precautions:  Breastfeeding.  Pain update:  Location of pain: Low back  Current pain: 2/10  Max pain: 5/10 Least pain: 2/10 Nature of pain:achy   Patient Goals: Decrease Urinary leakage, back pain,a and improve relaxation for intercourse.   OBJECTIVE  Changes in:  Pelvic floor: External Exam:  Introitus Appears: Gaping Skin integrity: normal Palpation: TTP through mons pubis and STP B, some through L IC. Cough: intact Prolapse visible?: no Scar mobility: not assessed  Internal Vaginal Exam:  Strength (PERF): 0, spasm preventing contraction/poor coordination Symmetry: symmetrical Palpation: highly TTP through all musculature, pain with twisting of finger. Prolapse: none   Palpation: Myofascial restriction through lumbar spine and SIJ region with exquisite tenderness. Pain with grade 1-2 PA mobs to spine and lumbar vertebrae following manual treatment.  Pt. Reports "less" pain with same pressure following treatment.  INTERVENTIONS THIS SESSION: Manual: assessed PFM and performed MFR via skin-rolling to lumbar and sacral base region of the back to improve fascial mobility and decrease sensitivity of tissue to allow for further mobilization to sacrum and spine to allow for improved motion and decreased pressure on nerve roots exiting at these levels for decreased  tissue sensitivity. Attempted sacral grade 1-2 mobs with slight decrease in pain but no further decrease after ~2 min. Of grade 2 mob. D/Ced to re-attempt at following visit following the MFR at today's session. Therex: educated Pt. On 3-way wall stretch to decrease tension of muscles acting upon and within the pelvis and seated pelvic tilts to maintain improved mobility of lumbar tissues.    Total time: 63 min.                           PT Education - 12/05/17 2049    Education provided  Yes    Education Details  See Pt. Instructions and Interventions this session    Person(s) Educated  Patient    Methods  Explanation;Demonstration;Handout;Verbal cues    Comprehension  Verbalized understanding;Returned demonstration;Verbal cues required       PT Short Term Goals - 11/29/17 1049      PT SHORT TERM GOAL #1   Title  Patient will demonstrate a coordinated contraction, relaxation, and bulge of the pelvic floor muscles to demonstrate functional recruitment and motion and allow for further strengthening.    Time  6    Period  Weeks    Status  New    Target Date  01/10/18      PT SHORT TERM GOAL #2   Title  Patient will demonstrate improved pelvic alignment and balance of musculature surrounding the pelvis to facilitate decreased PFM spasms and decrease pelvic pain.    Time  3    Period  Weeks    Status  New    Target Date  12/20/17      PT SHORT TERM GOAL #3   Title  Patient will report consistent use of foot-stool (squatty-potty) for positioning with BM to decrease pain with BM and intra-abdominal pressure.    Time  3    Period  Weeks    Status  New    Target Date  12/20/17      PT SHORT TERM GOAL #4   Title  Patient will demonstrate HEP x1 in the clinic to demonstrate understanding and proper form to allow for further improvement.    Time  3    Period  Weeks    Status  New    Target Date  12/20/17        PT Long Term Goals - 11/29/17 1053      PT LONG TERM GOAL #1   Title  Patient will report no episodes of SUI over the course of the prior two weeks to demonstrate improved functional ability.    Time  12    Period  Weeks    Status  New    Target Date  02/21/18      PT LONG TERM GOAL #2   Title  Patient will score at or below 2/300 on the PFDI and 0/300 on the PFIQ to demonstrate a clinically meaningful decrease in disability  and distress due to pelvic floor dysfunction.    Baseline  PFDI:47/300, PFIQ: 22/300    Time  12    Period  Weeks    Status  New    Target Date  02/21/18      PT LONG TERM GOAL #3   Title  Patient will report having BM's at least every-other day with consistency between [facility] stool scale 3-5 over the prior week to demonstrate decreased constipation.    Time  12    Period  Weeks    Status  New    Target Date  02/21/18      PT LONG TERM GOAL #4   Title  Patient will describe ability to have intercourse without vaginismus/penetration difficulty to demonstrate improved PFM relaxation and improved QOL.    Time  12    Period  Weeks    Status  New    Target Date  02/21/18      PT LONG TERM GOAL #5   Title  Patient will be able to stand or walf for greater than 2 hours without an increase in LBP greater than 2/10 to demonstrate improved quality of life and functional ability.    Baseline  1 hour of standing or walking increases pain to highest level (5/10)    Time  12    Period  Weeks    Status  New    Target Date  02/21/18            Plan - 12/05/17 2051    Clinical Impression Statement  Pt. responded well today, tolerating high levels of discomfort to allow for MFR to the lumbar fascia to improve mobility and decrease pain. She demonstrated understanding of all education provided. Continue per POC.    Clinical Presentation  Stable    Clinical Decision Making  Moderate    Rehab Potential  Good    Clinical Impairments Affecting Rehab Potential  Grade 3 tearing with delivery, diastasis recti, recent abdominal surgery    PT Frequency  1x / week    PT Duration  12 weeks    PT Treatment/Interventions  Biofeedback;Electrical Stimulation;Traction;Moist Heat;Functional mobility training;Therapeutic activities;Therapeutic exercise;Patient/family education;Neuromuscular re-education;Manual techniques;Scar mobilization;Energy conservation;Dry needling;Passive range of motion    PT Next  Visit Plan  perform PA to sacrum and TP release to abdomen for improved alignment    PT Home Exercise Plan  Child's pose, diastasis approximation, 3-way wall stretch and pelvic tilts in seated    Consulted and Agree with Plan of Care  Patient       Patient will benefit from skilled therapeutic intervention in order to improve the following deficits and impairments:  Decreased skin integrity, Increased fascial restricitons, Impaired sensation, Improper body mechanics, Pain, Decreased coordination, Decreased scar mobility, Increased muscle spasms, Impaired tone, Postural dysfunction, Decreased activity tolerance, Decreased range of motion, Decreased strength, Decreased balance, Difficulty walking  Visit Diagnosis: Other muscle spasm  Chronic midline low back pain without sciatica  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 Common bile duct stone   . Abnormal liver function tests   . Abnormal magnetic resonance imaging of liver   . Transaminitis 11/01/2017  . Third degree laceration of perineum during delivery, postpartum 10/06/2017  . Gestational hypertension 10/03/2017  . Abdominal pain affecting pregnancy 06/28/2017  . 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 Presence of subdermal contraceptive device 04/07/2014  . Sleep disturbance 11/03/2013  . Adjustment disorder with mixed anxiety and depressed mood 08/12/2013   Cleophus MoltKeeli T. Gailes DPT, ATC Cleophus MoltKeeli T Gailes 12/05/2017, 8:54 PM  [place] [facility] [address] , [place], [address] Phone: [phone]   Fax:  [phone]  Name: Hannah Hancock MRN: [id number] Date of Birth: 09/26/1995

## 2017-12-05 NOTE — Patient Instructions (Signed)
   Breath in as you push the bottom back, feeling the air come all the way down into the pelvis, breathe out and tuck under just to the point that you feel discomfort, do not push through the pain. Do 20 tilts 1-2 times per day.  3-Way Wall Stretches for Pelvic Floor Lengthening   Bring bottom close to the wall and gently press straighten knees to feel a stretch down the back of you thighs. Hold while taking 5 deep belly breaths and feeling the pelvic floor relax and lower on each inhale.    Let your legs fall to the side to feel a stretch on the inside of your thighs. If the stretch is too intense you can use a pillow to take some of the weight off by wedging it on the outside of your hips. Hold while taking 5 deep belly breaths and feeling the pelvic floor relax and lower on each inhale.    Slide feet down the wall and move hips slightly away from the wall and then let your knees fall to the sides so you feel a stretch on the inside of the thighs near your groin. Hold while taking 5 deep belly breaths and feeling the pelvic floor relax and lower on each inhale.     *Perform each stretch in sequence 3 times, once a day

## 2017-12-11 ENCOUNTER — Ambulatory Visit: Payer: BLUE CROSS/BLUE SHIELD

## 2017-12-11 DIAGNOSIS — M62838 Other muscle spasm: Secondary | ICD-10-CM

## 2017-12-11 DIAGNOSIS — K5902 Outlet dysfunction constipation: Secondary | ICD-10-CM

## 2017-12-11 DIAGNOSIS — M6208 Separation of muscle (nontraumatic), other site: Secondary | ICD-10-CM

## 2017-12-11 DIAGNOSIS — M545 Low back pain: Secondary | ICD-10-CM

## 2017-12-11 DIAGNOSIS — G8929 Other chronic pain: Secondary | ICD-10-CM

## 2017-12-11 NOTE — Therapy (Signed)
[place] [facility] MAIN [facility] SERVICES [address] [place], [name], [address] Phone: [phone]   Fax:  [phone]  Physical Therapy Treatment  Patient Details  Name: Hannah Hancock MRN: [id number] Date of Birth: 1995-09-28 Referring Provider: Genia Del   Encounter Date: 12/11/2017  PT End of Session - 12/11/17 1520    Visit Number  3    Number of Visits  12    Date for PT Re-Evaluation  01/02/18    Authorization Type  Medicaid    Authorization Time Period  11/28/2017- 01/02/2018    Authorization - Visit Number  3    Authorization - Number of Visits  4    PT Start Time  1405    PT Stop Time  1515    PT Time Calculation (min)  70 min    Activity Tolerance  Patient tolerated treatment well    Behavior During Therapy  [facility] for tasks assessed/performed       Past Medical History:  Diagnosis Date  . Abdominal pain, recurrent   . 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 Anxiety   . Asthma    no meds, no recent attacks  . Gallstones   . Tendinitis    R hip  . Urinary tract infection    Last one 4 months ago  . Weight loss     Past Surgical History:  Procedure Laterality Date  . ADENOIDECTOMY    . CHOLECYSTECTOMY N/A 11/03/2017   Procedure: LAPAROSCOPIC CHOLECYSTECTOMY;  Surgeon: Carolan Shiver, MD;  Location: [facility] ORS;  Service: General;  Laterality: N/A;  . ENDOSCOPIC RETROGRADE CHOLANGIOPANCREATOGRAPHY (ERCP) WITH PROPOFOL N/A 11/02/2017   Procedure: ENDOSCOPIC RETROGRADE CHOLANGIOPANCREATOGRAPHY (ERCP) WITH PROPOFOL;  Surgeon: Midge Minium, MD;  Location: [facility] ENDOSCOPY;  Service: Endoscopy;  Laterality: N/A;  . ESOPHAGOGASTRODUODENOSCOPY  10/20/2011   Procedure: ESOPHAGOGASTRODUODENOSCOPY (EGD);  Surgeon: Jon Gills, MD;  [facility];  Service: Gastroenterology;  Laterality: N/A;    There were no vitals filed for this visit.      Pelvic Floor Physical Therapy Treatment Note  SCREENING  Changes  in medications, allergies, or medical history?: no     SUBJECTIVE  Patient reports: She was TTP following last treatment session which mostly got better but she is still a little tender.   Precautions:  Breastfeeding  Pain update:  Location of pain: low back Current pain:  4/10  Max pain:  5/10 Least pain:  4/10 Nature of pain: achy  *reduced from 4/10 to 2/10 following today's treatment.  Patient Goals: Decrease Urinary leakage, back pain,a and improve relaxation for intercourse.    OBJECTIVE  Changes in: Posture/Observations:  Pt. Continues to demonstrate poor posture with little muscle activation in seated and standing.  Range of Motion/Flexibilty:  Sacrum demonstrated decreased motion on R>L but improved with PA mobs which were better tolerated by Pt. Today. Has some stiffness remaining, mostly in the upper third of the sacrum along the R border.  Palpation: TTP through L Piriformis and QL, decreased following TP release today.  INTERVENTIONS THIS SESSION: Manual: Performed TP release to R piriformis and QL to decrease spasm and pressure on lumbar and sacral nerves as well as to improve ability to perform sacral mobs effectively. Performed grade 3-4 sacral mobs to decrease restriction and allow for improved kinematic motion as well as to decrease pressure on sacral nerve roots causing pelvic pain and spasm. Therex: educated on and practiced piriformis stretch variations, settling on pigeon pose as the most  effective for her to help maintain sacral mobility and decrease pressure on sciatic nerve. Educated on and practiced seated side-stretch to decrease tension acting on the pelvis and nerves exiting the low back for decreased pain and spasm.  Total time: 70 min.                        PT Education - 12/11/17 1527    Education provided  Yes    Education Details  See Pt. Instructions and Interventions this session.    Person(s) Educated  Patient     Methods  Explanation;Demonstration;Verbal cues;Tactile cues;Handout    Comprehension  Verbalized understanding;Returned demonstration       PT Short Term Goals - 11/29/17 1049      PT SHORT TERM GOAL #1   Title  Patient will demonstrate a coordinated contraction, relaxation, and bulge of the pelvic floor muscles to demonstrate functional recruitment and motion and allow for further strengthening.    Time  6    Period  Weeks    Status  New    Target Date  01/10/18      PT SHORT TERM GOAL #2   Title  Patient will demonstrate improved pelvic alignment and balance of musculature surrounding the pelvis to facilitate decreased PFM spasms and decrease pelvic pain.    Time  3    Period  Weeks    Status  New    Target Date  12/20/17      PT SHORT TERM GOAL #3   Title  Patient will report consistent use of foot-stool (squatty-potty) for positioning with BM to decrease pain with BM and intra-abdominal pressure.    Time  3    Period  Weeks    Status  New    Target Date  12/20/17      PT SHORT TERM GOAL #4   Title  Patient will demonstrate HEP x1 in the clinic to demonstrate understanding and proper form to allow for further improvement.    Time  3    Period  Weeks    Status  New    Target Date  12/20/17        PT Long Term Goals - 11/29/17 1053      PT LONG TERM GOAL #1   Title  Patient will report no episodes of SUI over the course of the prior two weeks to demonstrate improved functional ability.    Time  12    Period  Weeks    Status  New    Target Date  02/21/18      PT LONG TERM GOAL #2   Title  Patient will score at or below 2/300 on the PFDI and 0/300 on the PFIQ to demonstrate a clinically meaningful decrease in disability and distress due to pelvic floor dysfunction.    Baseline  PFDI:47/300, PFIQ: 22/300    Time  12    Period  Weeks    Status  New    Target Date  02/21/18      PT LONG TERM GOAL #3   Title  Patient will report having BM's at least every-other  day with consistency between [facility] stool scale 3-5 over the prior week to demonstrate decreased constipation.    Time  12    Period  Weeks    Status  New    Target Date  02/21/18      PT LONG TERM GOAL #4   Title  Patient will  describe ability to have intercourse without vaginismus/penetration difficulty to demonstrate improved PFM relaxation and improved QOL.    Time  12    Period  Weeks    Status  New    Target Date  02/21/18      PT LONG TERM GOAL #5   Title  Patient will be able to stand or walf for greater than 2 hours without an increase in LBP greater than 2/10 to demonstrate improved quality of life and functional ability.    Baseline  1 hour of standing or walking increases pain to highest level (5/10)    Time  12    Period  Weeks    Status  New    Target Date  02/21/18            Plan - 12/11/17 1520    Clinical Impression Statement  Pt. Responded well to all interventions today, demonstrating a decrease inpain from 4/10 to 2/10 within-session. Continue per POC.    Clinical Presentation  Stable    Clinical Decision Making  Moderate    Rehab Potential  Good    Clinical Impairments Affecting Rehab Potential  Grade 3 tearing with delivery, diastasis recti, recent abdominal surgery    PT Frequency  1x / week    PT Duration  12 weeks    PT Treatment/Interventions  Biofeedback;Electrical Stimulation;Traction;Moist Heat;Functional mobility training;Therapeutic activities;Therapeutic exercise;Patient/family education;Neuromuscular re-education;Manual techniques;Scar mobilization;Energy conservation;Dry needling;Passive range of motion    PT Next Visit Plan  QL hold-relax to L LE, re-check sacrum, perfor active-assisted sacral mob. Perform Psoas release, consider SI belt.     PT Home Exercise Plan  Child's pose, diastasis approximation, 3-way wall stretch and pelvic tilts in seated, piriformis stretch, side-stretch    Consulted and Agree with Plan of Care  Patient        Patient will benefit from skilled therapeutic intervention in order to improve the following deficits and impairments:  Decreased skin integrity, Increased fascial restricitons, Impaired sensation, Improper body mechanics, Pain, Decreased coordination, Decreased scar mobility, Increased muscle spasms, Impaired tone, Postural dysfunction, Decreased activity tolerance, Decreased range of motion, Decreased strength, Decreased balance, Difficulty walking  Visit Diagnosis: Other muscle spasm  Chronic midline low back pain without sciatica  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 Common bile duct stone   . Abnormal liver function tests   . Abnormal magnetic resonance imaging of liver   . Transaminitis 11/01/2017  . Third degree laceration of perineum during delivery, postpartum 10/06/2017  . Gestational hypertension 10/03/2017  . Abdominal pain affecting pregnancy 06/28/2017  . 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 Presence of subdermal contraceptive device 04/07/2014  . Sleep disturbance 11/03/2013  . Adjustment disorder with mixed anxiety and depressed mood 08/12/2013   Cleophus MoltKeeli T. Gailes DPT, ATC Cleophus MoltKeeli T Gailes 12/11/2017, 3:41 PM  [place] [facility] [address] [place], [place], [address] Phone: [phone]   Fax:  [phone]  Name: Modesta MessingChasity D Schupp MRN: [id number] Date of Birth: 08/09/1996

## 2017-12-11 NOTE — Patient Instructions (Signed)
   Make forward knee angle bigger to get a deeper stretch, hold for 5 breaths and repeat 2 times on each side.     Starting with left hand reaching over, hold for 5 breaths, then do 5 on the other side and finish by repeating with the left side on to again.

## 2017-12-19 ENCOUNTER — Ambulatory Visit: Payer: BLUE CROSS/BLUE SHIELD | Attending: Certified Nurse Midwife

## 2017-12-19 DIAGNOSIS — M62838 Other muscle spasm: Secondary | ICD-10-CM | POA: Insufficient documentation

## 2017-12-19 DIAGNOSIS — G8929 Other chronic pain: Secondary | ICD-10-CM | POA: Diagnosis present

## 2017-12-19 DIAGNOSIS — M545 Low back pain, unspecified: Secondary | ICD-10-CM

## 2017-12-19 DIAGNOSIS — K5902 Outlet dysfunction constipation: Secondary | ICD-10-CM | POA: Diagnosis present

## 2017-12-19 DIAGNOSIS — M6208 Separation of muscle (nontraumatic), other site: Secondary | ICD-10-CM | POA: Insufficient documentation

## 2017-12-19 NOTE — Therapy (Signed)
[place] MAIN [facility] SERVICES [address] [place], [place], [address] Phone: [phone]   Fax:  [phone]  Physical Therapy Treatment  Patient Details  Name: Hannah Hancock MRN: [id number] Date of Birth: 07/23/1996 Referring Provider: Lisette Grinder   Encounter Date: 12/19/2017  PT End of Session - 12/20/17 1943    Visit Number  4    Number of Visits  12    Date for PT Re-Evaluation  01/02/18    Authorization Type  Medicaid    Authorization Time Period  11/28/2017- 01/02/2018    Authorization - Visit Number  4    Authorization - Number of Visits  4    PT Start Time  1435    PT Stop Time  1535    PT Time Calculation (min)  60 min    Activity Tolerance  Patient tolerated treatment well    Behavior During Therapy  [facility] for tasks assessed/performed       Past Medical History:  Diagnosis Date  . Abdominal pain, recurrent   . 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 Anxiety   . Asthma    no meds, no recent attacks  . Gallstones   . Tendinitis    R hip  . Urinary tract infection    Last one 4 months ago  . Weight loss     Past Surgical History:  Procedure Laterality Date  . ADENOIDECTOMY    . CHOLECYSTECTOMY N/A 11/03/2017   Procedure: LAPAROSCOPIC CHOLECYSTECTOMY;  Surgeon: Herbert Pun, MD;  Location: [facility] ORS;  Service: General;  Laterality: N/A;  . ENDOSCOPIC RETROGRADE CHOLANGIOPANCREATOGRAPHY (ERCP) WITH PROPOFOL N/A 11/02/2017   Procedure: ENDOSCOPIC RETROGRADE CHOLANGIOPANCREATOGRAPHY (ERCP) WITH PROPOFOL;  Surgeon: Lucilla Lame, MD;  Location: [facility] ENDOSCOPY;  Service: Endoscopy;  Laterality: N/A;  . ESOPHAGOGASTRODUODENOSCOPY  10/20/2011   Procedure: ESOPHAGOGASTRODUODENOSCOPY (EGD);  Surgeon: [name], MD;  Location: [place];  Service: Gastroenterology;  Laterality: N/A;    There were no vitals filed for this visit.    Pelvic Floor Physical Therapy Treatment Note  SCREENING  Changes in  medications, allergies, or medical history?: no     SUBJECTIVE  Patient reports: She has been doing pretty well over the last week. Is having less leakage, just a few drops when her bladder is really full.  Stretches are going well but feels that the "sharp pinch" internally on the L>R comes back after she does them.  Precautions:  Breastfeeding  Pain update:  Location of pain: Low back B Current pain:  0/10  Max pain:  2/10 Least pain:  0/10 Nature of pain: dull  Patient Goals: Decrease Urinary leakage, back pain,a and improve relaxation for intercourse.    OBJECTIVE  Changes in: Posture/Observations:  R ASIS low. Even following treatment.  Pain when getting in/out of bed without SI belt. No pain with SI belt.   Palpation:  TTP through R PSIS, Glute med, obliques.   INTERVENTIONS THIS SESSION: Manual: Performed TP release to R QL, Glute med, and obliques, followed by MET correction for R posterior/L anterior rotation to improve pelvic alignment for decreased spasm and pain.  Theract: educated on and given an SI belt to increase pelvic stability and decrease pain as Pt. Continues to breastfeed.   Total time: 60 min.                          PT Education - 12/20/17 1943    Education provided  Yes    Education Details  See Interventions this session    Person(s) Educated  Patient    Methods  Explanation    Comprehension  Verbalized understanding       PT Short Term Goals - 12/20/17 1941      PT SHORT TERM GOAL #1   Title  Patient will demonstrate a coordinated contraction, relaxation, and bulge of the pelvic floor muscles to demonstrate functional recruitment and motion and allow for further strengthening.    Time  6    Period  Weeks    Status  On-going    Target Date  01/10/18      PT SHORT TERM GOAL #2   Title  Patient will demonstrate improved pelvic alignment and balance of musculature surrounding the pelvis to facilitate  decreased PFM spasms and decrease pelvic pain.    Time  3    Period  Weeks    Status  Achieved    Target Date  12/20/17      PT SHORT TERM GOAL #3   Title  Patient will report consistent use of foot-stool (squatty-potty) for positioning with BM to decrease pain with BM and intra-abdominal pressure.    Time  3    Period  Weeks    Status  On-going    Target Date  12/20/17      PT SHORT TERM GOAL #4   Title  Patient will demonstrate HEP x1 in the clinic to demonstrate understanding and proper form to allow for further improvement.    Time  3    Period  Weeks    Status  Achieved    Target Date  12/20/17        PT Long Term Goals - 11/29/17 1053      PT LONG TERM GOAL #1   Title  Patient will report no episodes of SUI over the course of the prior two weeks to demonstrate improved functional ability.    Time  12    Period  Weeks    Status  New    Target Date  02/21/18      PT LONG TERM GOAL #2   Title  Patient will score at or below 2/300 on the PFDI and 0/300 on the PFIQ to demonstrate a clinically meaningful decrease in disability and distress due to pelvic floor dysfunction.    Baseline  PFDI:47/300, PFIQ: 22/300    Time  12    Period  Weeks    Status  New    Target Date  02/21/18      PT LONG TERM GOAL #3   Title  Patient will report having BM's at least every-other day with consistency between [facility] stool scale 3-5 over the prior week to demonstrate decreased constipation.    Time  12    Period  Weeks    Status  New    Target Date  02/21/18      PT LONG TERM GOAL #4   Title  Patient will describe ability to have intercourse without vaginismus/penetration difficulty to demonstrate improved PFM relaxation and improved QOL.    Time  12    Period  Weeks    Status  New    Target Date  02/21/18      PT LONG TERM GOAL #5   Title  Patient will be able to stand or walf for greater than 2 hours without an increase in LBP greater than 2/10 to demonstrate improved quality  of life and  functional ability.    Baseline  1 hour of standing or walking increases pain to highest level (5/10)    Time  12    Period  Weeks    Status  New    Target Date  02/21/18            Plan - 12/20/17 1953    Clinical Impression Statement  Pt. demonstrated great within and between-session changes with no pain today and less pain overall over past week. She is meeting her goals appropriately and has made all appointments. She has not attempted intercourse yet and based on high-irritability at assessment will continue to need further treatment to decrease PFM spasms before this will be possible. She will continue to benefit from skilled pelvic PT fr another 8 sessions on a once-weekly basis.     History and Personal Factors relevant to plan of care:  On Maternity leave, recently had a cholycystectomy, grade 3 tearing.    Clinical Presentation  Stable    Clinical Presentation due to:  Postpartum, breastfeeding, history of chronic constipation, vaginismus, and recurrent UTI's    Clinical Decision Making  Moderate    Rehab Potential  Good    Clinical Impairments Affecting Rehab Potential  Grade 3 tearing with delivery, diastasis recti, recent abdominal surgery    PT Frequency  1x / week    PT Duration  8 weeks    PT Treatment/Interventions  Biofeedback;Electrical Stimulation;Traction;Moist Heat;Functional mobility training;Therapeutic activities;Therapeutic exercise;Patient/family education;Neuromuscular re-education;Manual techniques;Scar mobilization;Energy conservation;Dry needling;Passive range of motion    PT Next Visit Plan  pelvic tilts, TA in quadruped, diastasis myofascial mobilization, Internal TP release    PT Home Exercise Plan  Child's pose, diastasis approximation, 3-way wall stretch and pelvic tilts in seated, piriformis stretch, side-stretch    Consulted and Agree with Plan of Care  Patient       Patient will benefit from skilled therapeutic intervention in order to  improve the following deficits and impairments:  Decreased skin integrity, Increased fascial restricitons, Impaired sensation, Improper body mechanics, Pain, Decreased coordination, Decreased scar mobility, Increased muscle spasms, Impaired tone, Postural dysfunction, Decreased activity tolerance, Decreased range of motion, Decreased strength, Decreased balance, Difficulty walking  Visit Diagnosis: Other muscle spasm  Chronic midline low back pain without sciatica  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 Common bile duct stone   . Abnormal liver function tests   . Abnormal magnetic resonance imaging of liver   . Transaminitis 11/01/2017  . Third degree laceration of perineum during delivery, postpartum 10/06/2017  . Gestational hypertension 10/03/2017  . Abdominal pain affecting pregnancy 06/28/2017  . 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 Presence of subdermal contraceptive device 04/07/2014  . Sleep disturbance 11/03/2013  . Adjustment disorder with mixed anxiety and depressed mood 08/12/2013   Willa Rough DPT, ATC Willa Rough 12/20/2017, 7:59 PM  [place] MAIN [facility] SERVICES [address] [place], [place], [address] Phone: [phone]   Fax:  [phone]  Name: Hannah Hancock MRN: [id number] Date of Birth: [birth year]

## 2018-01-11 ENCOUNTER — Ambulatory Visit: Payer: BLUE CROSS/BLUE SHIELD

## 2018-01-11 DIAGNOSIS — K5902 Outlet dysfunction constipation: Secondary | ICD-10-CM

## 2018-01-11 DIAGNOSIS — M545 Low back pain, unspecified: Secondary | ICD-10-CM

## 2018-01-11 DIAGNOSIS — G8929 Other chronic pain: Secondary | ICD-10-CM

## 2018-01-11 DIAGNOSIS — M62838 Other muscle spasm: Secondary | ICD-10-CM | POA: Diagnosis not present

## 2018-01-11 DIAGNOSIS — M6208 Separation of muscle (nontraumatic), other site: Secondary | ICD-10-CM

## 2018-01-11 NOTE — Therapy (Signed)
[place] [facility] MAIN [facility] SERVICES [address] [place], [name], [address] Phone: [phone]   Fax:  [phone]  Physical Therapy Treatment  Patient Details  Name: Hannah Hancock MRN: [id number] Date of Birth: 1995-09-20 Referring Provider: Genia Del   Encounter Date: 01/11/2018  PT End of Session - 01/11/18 0951    Visit Number  5    Number of Visits  12    Date for PT Re-Evaluation  02/21/18    Authorization Type  Medicaid    Authorization Time Period  through 02-21-2018    Authorization - Visit Number  1    Authorization - Number of Visits  8    PT Start Time  0814    PT Stop Time  0924    PT Time Calculation (min)  70 min    Activity Tolerance  Patient tolerated treatment well    Behavior During Therapy  [facility] for tasks assessed/performed       Past Medical History:  Diagnosis Date  . Abdominal pain, recurrent   . 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 Anxiety   . Asthma    no meds, no recent attacks  . Gallstones   . Tendinitis    R hip  . Urinary tract infection    Last one 4 months ago  . Weight loss     Past Surgical History:  Procedure Laterality Date  . ADENOIDECTOMY    . CHOLECYSTECTOMY N/A 11/03/2017   Procedure: LAPAROSCOPIC CHOLECYSTECTOMY;  Surgeon: Carolan Shiver, MD;  Location: [facility] ORS;  Service: General;  Laterality: N/A;  . ENDOSCOPIC RETROGRADE CHOLANGIOPANCREATOGRAPHY (ERCP) WITH PROPOFOL N/A 11/02/2017   Procedure: ENDOSCOPIC RETROGRADE CHOLANGIOPANCREATOGRAPHY (ERCP) WITH PROPOFOL;  Surgeon: Midge Minium, MD;  Location: [facility] ENDOSCOPY;  Service: Endoscopy;  Laterality: N/A;  . ESOPHAGOGASTRODUODENOSCOPY  10/20/2011   Procedure: ESOPHAGOGASTRODUODENOSCOPY (EGD);  Surgeon: Jon Gills, MD;  Location: [facility] OR;  Service: Gastroenterology;  Laterality: N/A;    There were no vitals filed for this visit.    Pelvic Floor Physical Therapy Treatment Note  SCREENING  Changes in  medications, allergies, or medical history?: no     SUBJECTIVE  Patient reports: Has been really busy and not noticed if there is much change.   Precautions:  Breastfeeding  Pain update:  Location of pain: low back Current pain:  0/10  Max pain:  2/10 Least pain:  0/10 Nature of pain:dull   Patient Goals: Decrease Urinary leakage, back pain,a and improve relaxation for intercourse.    OBJECTIVE  Changes in:  Pelvic floor: Demonstrated high sensitivity and spasm through posterior and anterior more than lateral portions of the PFM. Is not able to demonstrate active contraction due to high tone of PFM.  INTERVENTIONS THIS SESSION: Therex: educated on and practiced TA in quadruped and pelvic tilts in seated to improve TA activation and decrease tension on the LB to allow for improved PFM function and decreased pain.  Manual: Performed TP release to IC B, to PR anteriorly on the L and to PR and PC posteriorly as wll as MFR to scar at posterior fourchette to decrease PFM spasm and pain and allow for improved length and better ability to contract. Educated on self posterior fourchette massage to continue to decrease this sensitivity and spasm.  Total time: 70 min.                           PT Education - 01/11/18 6962  Education provided  Yes    Education Details  See Pt. Instructions and Interventions this session.    Person(s) Educated  Patient    Methods  Explanation;Tactile cues;Verbal cues;Handout;Demonstration    Comprehension  Verbalized understanding;Returned demonstration;Verbal cues required       PT Short Term Goals - 12/20/17 1941      PT SHORT TERM GOAL #1   Title  Patient will demonstrate a coordinated contraction, relaxation, and bulge of the pelvic floor muscles to demonstrate functional recruitment and motion and allow for further strengthening.    Time  6    Period  Weeks    Status  On-going    Target Date  01/10/18      PT  SHORT TERM GOAL #2   Title  Patient will demonstrate improved pelvic alignment and balance of musculature surrounding the pelvis to facilitate decreased PFM spasms and decrease pelvic pain.    Time  3    Period  Weeks    Status  Achieved    Target Date  12/20/17      PT SHORT TERM GOAL #3   Title  Patient will report consistent use of foot-stool (squatty-potty) for positioning with BM to decrease pain with BM and intra-abdominal pressure.    Time  3    Period  Weeks    Status  On-going    Target Date  12/20/17      PT SHORT TERM GOAL #4   Title  Patient will demonstrate HEP x1 in the clinic to demonstrate understanding and proper form to allow for further improvement.    Time  3    Period  Weeks    Status  Achieved    Target Date  12/20/17        PT Long Term Goals - 11/29/17 1053      PT LONG TERM GOAL #1   Title  Patient will report no episodes of SUI over the course of the prior two weeks to demonstrate improved functional ability.    Time  12    Period  Weeks    Status  New    Target Date  02/21/18      PT LONG TERM GOAL #2   Title  Patient will score at or below 2/300 on the PFDI and 0/300 on the PFIQ to demonstrate a clinically meaningful decrease in disability and distress due to pelvic floor dysfunction.    Baseline  PFDI:47/300, PFIQ: 22/300    Time  12    Period  Weeks    Status  New    Target Date  02/21/18      PT LONG TERM GOAL #3   Title  Patient will report having BM's at least every-other day with consistency between [facility] stool scale 3-5 over the prior week to demonstrate decreased constipation.    Time  12    Period  Weeks    Status  New    Target Date  02/21/18      PT LONG TERM GOAL #4   Title  Patient will describe ability to have intercourse without vaginismus/penetration difficulty to demonstrate improved PFM relaxation and improved QOL.    Time  12    Period  Weeks    Status  New    Target Date  02/21/18      PT LONG TERM GOAL #5    Title  Patient will be able to stand or walf for greater than 2 hours without an increase in  LBP greater than 2/10 to demonstrate improved quality of life and functional ability.    Baseline  1 hour of standing or walking increases pain to highest level (5/10)    Time  12    Period  Weeks    Status  New    Target Date  02/21/18            Plan - 01/11/18 0953    Clinical Impression Statement  Pt. responded well to all interventions today, demonstrating decreased sensitivity and spasm to internal PFM and understanding of all education provided. Continue per POC.    Clinical Presentation  Stable    Clinical Decision Making  Moderate    Rehab Potential  Good    Clinical Impairments Affecting Rehab Potential  Grade 3 tearing with delivery, diastasis recti, recent abdominal surgery    PT Frequency  1x / week    PT Duration  8 weeks    PT Treatment/Interventions  Biofeedback;Electrical Stimulation;Traction;Moist Heat;Functional mobility training;Therapeutic activities;Therapeutic exercise;Patient/family education;Neuromuscular re-education;Manual techniques;Scar mobilization;Energy conservation;Dry needling;Passive range of motion    PT Next Visit Plan  diastasis myofascial mobilization, re-check allignment and sacral and thoracic mobility, further Internal TP release    PT Home Exercise Plan  Child's pose, diastasis approximation, 3-way wall stretch and pelvic tilts in seated, piriformis stretch, side-stretch, pelvic tilts, TA in quadruped    Consulted and Agree with Plan of Care  Patient       Patient will benefit from skilled therapeutic intervention in order to improve the following deficits and impairments:  Decreased skin integrity, Increased fascial restricitons, Impaired sensation, Improper body mechanics, Pain, Decreased coordination, Decreased scar mobility, Increased muscle spasms, Impaired tone, Postural dysfunction, Decreased activity tolerance, Decreased range of motion,  Decreased strength, Decreased balance, Difficulty walking  Visit Diagnosis: Other muscle spasm  Chronic midline low back pain without sciatica  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 Common bile duct stone   . Abnormal liver function tests   . Abnormal magnetic resonance imaging of liver   . Transaminitis 11/01/2017  . Third degree laceration of perineum during delivery, postpartum 10/06/2017  . Gestational hypertension 10/03/2017  . Abdominal pain affecting pregnancy 06/28/2017  . 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 Presence of subdermal contraceptive device 04/07/2014  . Sleep disturbance 11/03/2013  . Adjustment disorder with mixed anxiety and depressed mood 08/12/2013   Cleophus Molt DPT, ATC Cleophus Molt 01/11/2018, 9:56 AM  [place] [facility] MAIN [facility] SERVICES [address] [place], [name], [address] Phone: [phone]   Fax:  [phone]  Name: SADDIE SANDEEN MRN: [id number] Date of Birth: 1995/12/01

## 2018-01-11 NOTE — Patient Instructions (Addendum)
1) After you urinate stand up and then sit back down to see if you ca get the residual volume to release.    Breathe in, let belly relax down toward the floor and then breathe out, pulling the lower belly in toward the backbone.   Repeat this _10x2__ times _1__ times per day  Bracing With Arm / Leg Raise (Quadruped)    On hands and knees find neutral spine. Tighten pelvic floor and abdominals and hold. Working in a circle, lift arm to shoulder level then opposite arm, leg and opposite leg to the point that you are still in control. Repeat __5_ times. Do __1-2_ times a day.      Do 2 sets of 15 tilts per day. Breathe in when you tilt forward (A) and out when you tuck under (B).  Self Posterior Fourchette Stretching/Mobilization    1) Wash your hands and prop your body up so you can easily reach the vagina, bring hand-held mirror if desired.  2) Apply lubricant to the thumb and vaginal opening  3) Place thumb ~ 1/2 an inch into the vagina with the pad of the thumb pointed down and apply gentle pressure to the posterior fourchette.  4) Gently sweep the thumb side to side and in/out while maintaining pressure down toward the anus. Make sure the pressure is not so great that your muscles tighten up and guard, just enough to create slight discomfort.  Do this for ~ 3 min. Per night to decrease tightness and tenderness at the vaginal opening.   * Look for a Gel Toe Separators for your bunion and arch support to reduce bunion.

## 2018-01-22 ENCOUNTER — Ambulatory Visit: Payer: BLUE CROSS/BLUE SHIELD | Attending: Certified Nurse Midwife

## 2018-01-22 DIAGNOSIS — K5902 Outlet dysfunction constipation: Secondary | ICD-10-CM | POA: Diagnosis present

## 2018-01-22 DIAGNOSIS — M62838 Other muscle spasm: Secondary | ICD-10-CM | POA: Diagnosis present

## 2018-01-22 DIAGNOSIS — G8929 Other chronic pain: Secondary | ICD-10-CM

## 2018-01-22 DIAGNOSIS — M6208 Separation of muscle (nontraumatic), other site: Secondary | ICD-10-CM | POA: Diagnosis present

## 2018-01-22 DIAGNOSIS — M545 Low back pain: Secondary | ICD-10-CM | POA: Insufficient documentation

## 2018-01-22 NOTE — Patient Instructions (Signed)
  Shoulder Retraction and Downward Rotation   Rotate the shoulder blades back and down as if you had to hold a pencil between them, holding for 1 full second each time. Repeat this _10x3_ times _1-3_ times per day.      Start with Shoulder Retraction and Downward Rotation pictures above, holding the position while pulling the chin straight back as if trying to make a "double chin".  Breathe in forward and breathe out as you pull back, repeating this _10x3__ times _1-3___ times per day.  

## 2018-01-22 NOTE — Therapy (Signed)
[facility] MAIN [facility] SERVICES [address] [place], [name], [address] Phone: [phone]   Fax:  [phone]  Physical Therapy Treatment  Patient Details  Name: Hannah Hancock MRN: [id number] Date of Birth: 03/17/1996 Referring Provider: Genia Del   Encounter Date: 01/22/2018  PT End of Session - 01/22/18 1545    Visit Number  6    Number of Visits  12    Date for PT Re-Evaluation  02/21/18    Authorization Type  Medicaid    Authorization Time Period  through 02-21-2018    Authorization - Visit Number  6    Authorization - Number of Visits  8    PT Start Time  1309    PT Stop Time  1404    PT Time Calculation (min)  55 min    Activity Tolerance  Patient tolerated treatment well    Behavior During Therapy  [facility] for tasks assessed/performed       Past Medical History:  Diagnosis Date  . Abdominal pain, recurrent   . 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 Anxiety   . Asthma    no meds, no recent attacks  . Gallstones   . Tendinitis    R hip  . Urinary tract infection    Last one 4 months ago  . Weight loss     Past Surgical History:  Procedure Laterality Date  . ADENOIDECTOMY    . CHOLECYSTECTOMY N/A 11/03/2017   Procedure: LAPAROSCOPIC CHOLECYSTECTOMY;  Surgeon: Carolan Shiver, MD;  Location: [facility] ORS;  Service: General;  Laterality: N/A;  . ENDOSCOPIC RETROGRADE CHOLANGIOPANCREATOGRAPHY (ERCP) WITH PROPOFOL N/A 11/02/2017   Procedure: ENDOSCOPIC RETROGRADE CHOLANGIOPANCREATOGRAPHY (ERCP) WITH PROPOFOL;  Surgeon: Midge Minium, MD;  Location: [facility] ENDOSCOPY;  Service: Endoscopy;  Laterality: N/A;  . ESOPHAGOGASTRODUODENOSCOPY  10/20/2011   Procedure: ESOPHAGOGASTRODUODENOSCOPY (EGD);  Surgeon: Jon Gills, MD;  Location: [facility] OR;  Service: Gastroenterology;  Laterality: N/A;    There were no vitals filed for this visit.    Pelvic Floor Physical Therapy Treatment Note  SCREENING  Changes in  medications, allergies, or medical history?: no   SUBJECTIVE  Patient reports: She has had some cramps that feel like menstrual cramps but nothing else since soreness wor off after last treatment. Most of leakage still occurs after urination.   Pain update:  Location of pain: pelvis Current pain:  0/10  Max pain:  0/10 Least pain:  0/10 Nature of pain:  "some cramps" feels like menstrual  Patient Goals: Decrease Urinary leakage, back pain,a and improve relaxation for intercourse.     OBJECTIVE  Changes in: Posture/Observations:  Forward head and shoulders, anterior pelvic tilt. Anterior rotation on R pre treatment, resolved following treatment.   Range of Motion/Flexibilty:  Decreased mobility through R sacral border at start of session, improved following manual treatment. Decreased sensitivity but continued TTP through upper thoracic spine and less so through ribs.  Palpation: TTP through B Glute min and R Piriformis.  INTERVENTIONS THIS SESSION: Manual: Performed TP release to R Piriformis and B Glute min as well as R thoracic paraspinals as well as grade 3 mobs to R sacrum, B Ribs from ~ T4-7 and PA to spine through ~T2-8 to decrease pain and improve mobility for better posture and decreased pressure on nerves leading to the pelvis for less pain with intercourse and urinary incontinence. Therex: Educated on and practiced scapular retractions and chin-tucks to continue to improve spinal mobility and to strengthen good posture  to prevent return of symptoms.  Total time: 55 min.                          PT Education - 01/22/18 1549    Education provided  Yes    Education Details  See Pt. Instructions and Interventions this session.     Person(s) Educated  Patient    Methods  Explanation;Demonstration;Verbal cues;Handout;Tactile cues    Comprehension  Verbalized understanding;Returned demonstration;Verbal cues required;Tactile cues required;Need  further instruction       PT Short Term Goals - 12/20/17 1941      PT SHORT TERM GOAL #1   Title  Patient will demonstrate a coordinated contraction, relaxation, and bulge of the pelvic floor muscles to demonstrate functional recruitment and motion and allow for further strengthening.    Time  6    Period  Weeks    Status  On-going    Target Date  01/10/18      PT SHORT TERM GOAL #2   Title  Patient will demonstrate improved pelvic alignment and balance of musculature surrounding the pelvis to facilitate decreased PFM spasms and decrease pelvic pain.    Time  3    Period  Weeks    Status  Achieved    Target Date  12/20/17      PT SHORT TERM GOAL #3   Title  Patient will report consistent use of foot-stool (squatty-potty) for positioning with BM to decrease pain with BM and intra-abdominal pressure.    Time  3    Period  Weeks    Status  On-going    Target Date  12/20/17      PT SHORT TERM GOAL #4   Title  Patient will demonstrate HEP x1 in the clinic to demonstrate understanding and proper form to allow for further improvement.    Time  3    Period  Weeks    Status  Achieved    Target Date  12/20/17        PT Long Term Goals - 11/29/17 1053      PT LONG TERM GOAL #1   Title  Patient will report no episodes of SUI over the course of the prior two weeks to demonstrate improved functional ability.    Time  12    Period  Weeks    Status  New    Target Date  02/21/18      PT LONG TERM GOAL #2   Title  Patient will score at or below 2/300 on the PFDI and 0/300 on the PFIQ to demonstrate a clinically meaningful decrease in disability and distress due to pelvic floor dysfunction.    Baseline  PFDI:47/300, PFIQ: 22/300    Time  12    Period  Weeks    Status  New    Target Date  02/21/18      PT LONG TERM GOAL #3   Title  Patient will report having BM's at least every-other day with consistency between [facility] stool scale 3-5 over the prior week to demonstrate decreased  constipation.    Time  12    Period  Weeks    Status  New    Target Date  02/21/18      PT LONG TERM GOAL #4   Title  Patient will describe ability to have intercourse without vaginismus/penetration difficulty to demonstrate improved PFM relaxation and improved QOL.    Time  12  Period  Weeks    Status  New    Target Date  02/21/18      PT LONG TERM GOAL #5   Title  Patient will be able to stand or walf for greater than 2 hours without an increase in LBP greater than 2/10 to demonstrate improved quality of life and functional ability.    Baseline  1 hour of standing or walking increases pain to highest level (5/10)    Time  12    Period  Weeks    Status  New    Target Date  02/21/18            Plan - 01/22/18 1550    Clinical Impression Statement  Pt. responded well to all interventions today, demonstrating decreased spinal sensitivity and impproved alignment and mobility following treatment as well as understanding of all education provided. Continue per POC.    Clinical Presentation  Stable    Clinical Decision Making  Moderate    Rehab Potential  Good    Clinical Impairments Affecting Rehab Potential  Grade 3 tearing with delivery, diastasis recti, recent abdominal surgery    PT Frequency  1x / week    PT Duration  8 weeks    PT Treatment/Interventions  Biofeedback;Electrical Stimulation;Traction;Moist Heat;Functional mobility training;Therapeutic activities;Therapeutic exercise;Patient/family education;Neuromuscular re-education;Manual techniques;Scar mobilization;Energy conservation;Dry needling;Passive range of motion    PT Next Visit Plan  Internal release and upper-thoracic mobility/SCM and pec and sub-occipitals release    PT Home Exercise Plan  Child's pose, diastasis approximation, 3-way wall stretch and pelvic tilts in seated, piriformis stretch, side-stretch, pelvic tilts, TA in quadruped    Consulted and Agree with Plan of Care  Patient       Patient will  benefit from skilled therapeutic intervention in order to improve the following deficits and impairments:  Decreased skin integrity, Increased fascial restricitons, Impaired sensation, Improper body mechanics, Pain, Decreased coordination, Decreased scar mobility, Increased muscle spasms, Impaired tone, Postural dysfunction, Decreased activity tolerance, Decreased range of motion, Decreased strength, Decreased balance, Difficulty walking  Visit Diagnosis: Other muscle spasm  Chronic midline low back pain without sciatica  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 Common bile duct stone   . Abnormal liver function tests   . Abnormal magnetic resonance imaging of liver   . Transaminitis 11/01/2017  . Third degree laceration of perineum during delivery, postpartum 10/06/2017  . Gestational hypertension 10/03/2017  . Abdominal pain affecting pregnancy 06/28/2017  . 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 Presence of subdermal contraceptive device 04/07/2014  . Sleep disturbance 11/03/2013  . Adjustment disorder with mixed anxiety and depressed mood 08/12/2013   Cleophus MoltKeeli T. Soffia Doshier DPT, ATC Cleophus MoltKeeli T Yetunde Leis 01/22/2018, 3:54 PM  [facility]IN [facility] [address] [place], [place], [address] Phone: [phone]   Fax:  [phone]  Name: Modesta MessingChasity D Saez MRN: [id number] Date of Birth: 03/24/1996

## 2018-01-28 ENCOUNTER — Ambulatory Visit: Payer: BLUE CROSS/BLUE SHIELD

## 2018-01-31 ENCOUNTER — Ambulatory Visit: Payer: BLUE CROSS/BLUE SHIELD

## 2018-02-04 ENCOUNTER — Ambulatory Visit: Payer: BLUE CROSS/BLUE SHIELD

## 2018-02-06 ENCOUNTER — Ambulatory Visit: Payer: BLUE CROSS/BLUE SHIELD

## 2018-02-06 DIAGNOSIS — K5902 Outlet dysfunction constipation: Secondary | ICD-10-CM

## 2018-02-06 DIAGNOSIS — M6208 Separation of muscle (nontraumatic), other site: Secondary | ICD-10-CM

## 2018-02-06 DIAGNOSIS — G8929 Other chronic pain: Secondary | ICD-10-CM

## 2018-02-06 DIAGNOSIS — M62838 Other muscle spasm: Secondary | ICD-10-CM

## 2018-02-06 DIAGNOSIS — M545 Low back pain: Secondary | ICD-10-CM

## 2018-02-06 NOTE — Therapy (Signed)
[facility] MAIN [facility] [address] [place], [place], [address] Phone: [phone]   Fax:  [phone]  Physical Therapy Treatment  Patient Details  Name: Hannah MessingChasity D Hancock MRN: [id number] Date of Birth: 09/16/1995 Referring Provider: Genia DelHaviland, Margaret   Encounter Date: 02/06/2018  PT End of Session - 02/06/18 2052    Visit Number  7    Number of Visits  12    Date for PT Re-Evaluation  02/21/18    Authorization Type  Medicaid    Authorization Time Period  through 02-21-2018    Authorization - Visit Number  7    Authorization - Number of Visits  12    PT Start Time  1008    PT Stop Time  1118    PT Time Calculation (min)  70 min    Activity Tolerance  Patient tolerated treatment well    Behavior During Therapy  [facility] for tasks assessed/performed       Past Medical History:  Diagnosis Date  . Abdominal pain, recurrent   . 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 Anxiety   . Asthma    no meds, no recent attacks  . Gallstones   . Tendinitis    R hip  . Urinary tract infection    Last one 4 months ago  . Weight loss     Past Surgical History:  Procedure Laterality Date  . ADENOIDECTOMY    . CHOLECYSTECTOMY N/A 11/03/2017   Procedure: LAPAROSCOPIC CHOLECYSTECTOMY;  Surgeon: Carolan Shiverintron-Diaz, Edgardo, MD;  Location: [facility] ORS;  Service: General;  Laterality: N/A;  . ENDOSCOPIC RETROGRADE CHOLANGIOPANCREATOGRAPHY (ERCP) WITH PROPOFOL N/A 11/02/2017   Procedure: ENDOSCOPIC RETROGRADE CHOLANGIOPANCREATOGRAPHY (ERCP) WITH PROPOFOL;  Surgeon: Midge MiniumWohl, Darren, MD;  Location: [facility] ENDOSCOPY;  Service: Endoscopy;  Laterality: N/A;  . ESOPHAGOGASTRODUODENOSCOPY  10/20/2011   Procedure: ESOPHAGOGASTRODUODENOSCOPY (EGD);  Surgeon: Jon GillsJoseph H. Clark, MD;  [facility];  Service: Gastroenterology;  Laterality: N/A;    There were no vitals filed for this visit.    Pelvic Floor Physical Therapy Treatment Note  SCREENING  Changes in  medications, allergies, or medical history?: no    SUBJECTIVE  Patient reports: She has not had any sex drive so she has not tried having intercourse yet. Has not had pain and only has minimal leakage after urinating and then standing, has tried sit-to-stand with no success.  Precautions:  breastfeeding  Pain update: No pain  Patient Goals: Decrease Urinary leakage, back pain,a and improve relaxation for intercourse.   OBJECTIVE  Changes in: Posture/Observations:  Anterior pelvic tilt, minor forward head  Range of Motion/Flexibilty:  Decreased hip ER in supine and TTP to adductors. uch improved with no pain following treatment.  Palpation: TTP through B adductors and at the posterior fourchette with pressure.  Pt. Was able to improve from any pressure causing pain to ~ 1 cm depression of posterior fourchette without increased pain following treatment.  INTERVENTIONS THIS SESSION: Manual: Performed internal TP release and scar mobilization to posterior fourchette to decrease spasm and pressure on nerves that are encouraging pain and spasm of other PFM to decrease sensitivity for decreased pain with intercourse. Performed TP release B to adductors to decrease spasm and referred pain as well as decrease tension acting on the pelvis to allow for improved PFM relaxation. Self-care: educated on proper vulvar hygiene practices and how to maintain new length of adductors as well as the importance of being compliant with her home posterior fourchette massage to continue to  make progress.  Dry Needle: Performed standard approach DN to B adductors using a .25x36mm needle  to decrease spasm and referred pain as well as decrease tension acting on the pelvis to allow for improved PFM relaxation.   Total time: 70 min.                   Trigger Point Dry Needling - 02/06/18 2110    Consent Given?  Yes    Education Handout Provided  No    Muscles Treated Upper Body   Adductor longus/brevius/magnus           PT Education - 02/06/18 2053    Education provided  Yes    Education Details  Pt. Educated on risks and benefits of dry needling as well as what to expect following treeatment and what exercises to be extra dilligent with to maintain improvement. Also educated on vulvar hygeine.    Person(s) Educated  Patient    Methods  Explanation;Verbal cues;Handout    Comprehension  Verbalized understanding;Verbal cues required       PT Short Term Goals - 12/20/17 1941      PT SHORT TERM GOAL #1   Title  Patient will demonstrate a coordinated contraction, relaxation, and bulge of the pelvic floor muscles to demonstrate functional recruitment and motion and allow for further strengthening.    Time  6    Period  Weeks    Status  On-going    Target Date  01/10/18      PT SHORT TERM GOAL #2   Title  Patient will demonstrate improved pelvic alignment and balance of musculature surrounding the pelvis to facilitate decreased PFM spasms and decrease pelvic pain.    Time  3    Period  Weeks    Status  Achieved    Target Date  12/20/17      PT SHORT TERM GOAL #3   Title  Patient will report consistent use of foot-stool (squatty-potty) for positioning with BM to decrease pain with BM and intra-abdominal pressure.    Time  3    Period  Weeks    Status  On-going    Target Date  12/20/17      PT SHORT TERM GOAL #4   Title  Patient will demonstrate HEP x1 in the clinic to demonstrate understanding and proper form to allow for further improvement.    Time  3    Period  Weeks    Status  Achieved    Target Date  12/20/17        PT Long Term Goals - 11/29/17 1053      PT LONG TERM GOAL #1   Title  Patient will report no episodes of SUI over the course of the prior two weeks to demonstrate improved functional ability.    Time  12    Period  Weeks    Status  New    Target Date  02/21/18      PT LONG TERM GOAL #2   Title  Patient will score at or  below 2/300 on the PFDI and 0/300 on the PFIQ to demonstrate a clinically meaningful decrease in disability and distress due to pelvic floor dysfunction.    Baseline  PFDI:47/300, PFIQ: 22/300    Time  12    Period  Weeks    Status  New    Target Date  02/21/18      PT LONG TERM GOAL #3   Title  Patient  will report having BM's at least every-other day with consistency between [facility] stool scale 3-5 over the prior week to demonstrate decreased constipation.    Time  12    Period  Weeks    Status  New    Target Date  02/21/18      PT LONG TERM GOAL #4   Title  Patient will describe ability to have intercourse without vaginismus/penetration difficulty to demonstrate improved PFM relaxation and improved QOL.    Time  12    Period  Weeks    Status  New    Target Date  02/21/18      PT LONG TERM GOAL #5   Title  Patient will be able to stand or walf for greater than 2 hours without an increase in LBP greater than 2/10 to demonstrate improved quality of life and functional ability.    Baseline  1 hour of standing or walking increases pain to highest level (5/10)    Time  12    Period  Weeks    Status  New    Target Date  02/21/18            Plan - 02/06/18 2056    Clinical Impression Statement  Pt. responded well to all interventions today, demonstrating improved hip ER and decreased TTP of the adductors following TP release and needling to decrease spasms. She also demonstrated understanding of all education provided and intent to be more diligent with HEP especially for TP release to posterior fourchette. Continue per POC.    Clinical Presentation  Stable    Clinical Decision Making  Moderate    Rehab Potential  Good    Clinical Impairments Affecting Rehab Potential  Grade 3 tearing with delivery, diastasis recti, recent abdominal surgery    PT Frequency  1x / week    PT Duration  8 weeks    PT Treatment/Interventions  Biofeedback;Electrical Stimulation;Traction;Moist  Heat;Functional mobility training;Therapeutic activities;Therapeutic exercise;Patient/family education;Neuromuscular re-education;Manual techniques;Scar mobilization;Energy conservation;Dry needling;Passive range of motion    PT Next Visit Plan  Internal release and upper-thoracic mobility/SCM and pec and sub-occipitals release Increase TA to mini-marches, multi-stepping     PT Home Exercise Plan  Child's pose, diastasis approximation, 3-way wall stretch and pelvic tilts in seated, piriformis stretch, side-stretch, TA in quadruped, bird-dog    Consulted and Agree with Plan of Care  Patient       Patient will benefit from skilled therapeutic intervention in order to improve the following deficits and impairments:  Decreased skin integrity, Increased fascial restricitons, Impaired sensation, Improper body mechanics, Pain, Decreased coordination, Decreased scar mobility, Increased muscle spasms, Impaired tone, Postural dysfunction, Decreased activity tolerance, Decreased range of motion, Decreased strength, Decreased balance, Difficulty walking  Visit Diagnosis: Other muscle spasm  Chronic midline low back pain without sciatica  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 Common bile duct stone   . Abnormal liver function tests   . Abnormal magnetic resonance imaging of liver   . Transaminitis 11/01/2017  . Third degree laceration of perineum during delivery, postpartum 10/06/2017  . Gestational hypertension 10/03/2017  . Abdominal pain affecting pregnancy 06/28/2017  . 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 Presence of subdermal contraceptive device 04/07/2014  . Sleep disturbance 11/03/2013  . Adjustment disorder with mixed anxiety and depressed mood 08/12/2013   Cleophus Molt DPT, ATC Cleophus Molt 02/06/2018, 9:12 PM  [facility]  SERVICES [address] [place], [name], [address] Phone: [phone]   Fax:  [phone]  Name: Hannah Hancock MRN: [id number] Date of Birth: 1996-07-12

## 2018-02-06 NOTE — Patient Instructions (Signed)
Access Code: [email or web address]  URL: [email or web address]  Date: 02/06/2018  Prepared by: Flora LippsKeeli Aliviya Schoeller   Exercises  V Sit Hip Adductor Hamstring Stretch - 3 reps - 5 breaths Hold - 1x daily  Quadruped Adductor Stretch - 3 reps - 5 Breaths Hold - 1x daily  Diaphragmatic Breathing in Child's Pose with Pelvic Floor Relaxation - 3 reps - 5 breaths Hold - 1x daily

## 2018-02-11 ENCOUNTER — Ambulatory Visit: Payer: BLUE CROSS/BLUE SHIELD

## 2018-02-11 DIAGNOSIS — K5902 Outlet dysfunction constipation: Secondary | ICD-10-CM

## 2018-02-11 DIAGNOSIS — M545 Low back pain: Secondary | ICD-10-CM

## 2018-02-11 DIAGNOSIS — M62838 Other muscle spasm: Secondary | ICD-10-CM

## 2018-02-11 DIAGNOSIS — M6208 Separation of muscle (nontraumatic), other site: Secondary | ICD-10-CM

## 2018-02-11 DIAGNOSIS — G8929 Other chronic pain: Secondary | ICD-10-CM

## 2018-02-11 NOTE — Therapy (Signed)
[facility] MAIN [facility] SERVICES [address] [place], [name], [address] Phone: [phone]   Fax:  [phone]  Physical Therapy Treatment  Patient Details  Name: Hannah Hancock MRN: [id number] Date of Birth: 01/31/1996 Referring Provider: Genia Del   Encounter Date: 02/11/2018  PT End of Session - 02/12/18 2208    Visit Number  8    Number of Visits  12    Date for PT Re-Evaluation  02/21/18    Authorization Type  Medicaid    Authorization Time Period  through 02-21-2018    Authorization - Visit Number  8    Authorization - Number of Visits  12    PT Start Time  1300    PT Stop Time  1400    PT Time Calculation (min)  60 min    Activity Tolerance  Patient tolerated treatment well    Behavior During Therapy  [facility] for tasks assessed/performed       Past Medical History:  Diagnosis Date  . Abdominal pain, recurrent   . 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 Anxiety   . Asthma    no meds, no recent attacks  . Gallstones   . Tendinitis    R hip  . Urinary tract infection    Last one 4 months ago  . Weight loss     Past Surgical History:  Procedure Laterality Date  . ADENOIDECTOMY    . CHOLECYSTECTOMY N/A 11/03/2017   Procedure: LAPAROSCOPIC CHOLECYSTECTOMY;  Surgeon: Carolan Shiver, MD;  Location: [facility] ORS;  Service: General;  Laterality: N/A;  . ENDOSCOPIC RETROGRADE CHOLANGIOPANCREATOGRAPHY (ERCP) WITH PROPOFOL N/A 11/02/2017   Procedure: ENDOSCOPIC RETROGRADE CHOLANGIOPANCREATOGRAPHY (ERCP) WITH PROPOFOL;  Surgeon: Midge Minium, MD;  Location: [facility] ENDOSCOPY;  Service: Endoscopy;  Laterality: N/A;  . ESOPHAGOGASTRODUODENOSCOPY  10/20/2011   Procedure: ESOPHAGOGASTRODUODENOSCOPY (EGD);  Surgeon: Jon Gills, MD;  Location: [facility] OR;  Service: Gastroenterology;  Laterality: N/A;    There were no vitals filed for this visit.    Pelvic Floor Physical Therapy Treatment Note  SCREENING  Changes in  medications, allergies, or medical history?: no   SUBJECTIVE  Patient reports: No incontinence, has not tried intercourse due to lack of sleep from baby's sleep habits.  Precautions:  Breastfeeding.  Pain update:  No pain.  Patient Goals: Decrease Urinary leakage, back pain,a and improve relaxation for intercourse.   OBJECTIVE  Changes in: Posture/Observations:  Pt. Demonstrates improved posture with cueing but still trends toward a slouched position when not cued.   Appears to have greater lateral hip weakness/poor coordination on the L with side-step evidenced by R rotation with lunge.  Range of Motion/Flexibilty:  Pt. Demonstrates continued highly sensitive thoracic vertebrae.  INTERVENTIONS THIS SESSION: Manual: Performed grade 2 PA mobs to thoracic spine to decrease pain with no reduction after sufficient attempts. Performed TP release and STM along thoracis paraspinals and multifidus to decrease spasm and pressure on nerve roots. NM re-ed: Educated on and practiced mini-marches and multi-directional stepping to improve timing and recruitment of deep core stabilizers and strengthen deep core and hip stabilizers to allow for reduction of PFM as supportive muscles take the load off of them. Dry needle: performed TPDN with standard approach to thoracic multifidus at ~T6, & and 12 to decrease spasm and sensitivity of the thoracic spine and take pressure off of nerve roots to decrease PFM spasms.  Total time: 60 min.  Trigger Point Dry Needling - 02/12/18 2206    Consent Given?  Yes    Education Handout Provided  No    Muscles Treated Upper Body  -- Thoracic multifidus near T6,7 and T12           PT Education - 02/12/18 2207    Education provided  Yes    Education Details  See Pt. Instructions and Interventions this session    Person(s) Educated  Patient    Methods  Explanation;Demonstration;Tactile cues;Verbal cues;Handout     Comprehension  Verbalized understanding;Returned demonstration;Verbal cues required;Tactile cues required;Need further instruction       PT Short Term Goals - 12/20/17 1941      PT SHORT TERM GOAL #1   Title  Patient will demonstrate a coordinated contraction, relaxation, and bulge of the pelvic floor muscles to demonstrate functional recruitment and motion and allow for further strengthening.    Time  6    Period  Weeks    Status  On-going    Target Date  01/10/18      PT SHORT TERM GOAL #2   Title  Patient will demonstrate improved pelvic alignment and balance of musculature surrounding the pelvis to facilitate decreased PFM spasms and decrease pelvic pain.    Time  3    Period  Weeks    Status  Achieved    Target Date  12/20/17      PT SHORT TERM GOAL #3   Title  Patient will report consistent use of foot-stool (squatty-potty) for positioning with BM to decrease pain with BM and intra-abdominal pressure.    Time  3    Period  Weeks    Status  On-going    Target Date  12/20/17      PT SHORT TERM GOAL #4   Title  Patient will demonstrate HEP x1 in the clinic to demonstrate understanding and proper form to allow for further improvement.    Time  3    Period  Weeks    Status  Achieved    Target Date  12/20/17        PT Long Term Goals - 11/29/17 1053      PT LONG TERM GOAL #1   Title  Patient will report no episodes of SUI over the course of the prior two weeks to demonstrate improved functional ability.    Time  12    Period  Weeks    Status  New    Target Date  02/21/18      PT LONG TERM GOAL #2   Title  Patient will score at or below 2/300 on the PFDI and 0/300 on the PFIQ to demonstrate a clinically meaningful decrease in disability and distress due to pelvic floor dysfunction.    Baseline  PFDI:47/300, PFIQ: 22/300    Time  12    Period  Weeks    Status  New    Target Date  02/21/18      PT LONG TERM GOAL #3   Title  Patient will report having BM's at least  every-other day with consistency between [facility] stool scale 3-5 over the prior week to demonstrate decreased constipation.    Time  12    Period  Weeks    Status  New    Target Date  02/21/18      PT LONG TERM GOAL #4   Title  Patient will describe ability to have intercourse without vaginismus/penetration difficulty to demonstrate improved PFM relaxation  and improved QOL.    Time  12    Period  Weeks    Status  New    Target Date  02/21/18      PT LONG TERM GOAL #5   Title  Patient will be able to stand or walf for greater than 2 hours without an increase in LBP greater than 2/10 to demonstrate improved quality of life and functional ability.    Baseline  1 hour of standing or walking increases pain to highest level (5/10)    Time  12    Period  Weeks    Status  New    Target Date  02/21/18            Plan - 02/12/18 2209    Clinical Impression Statement  Pt. Responded well to all interventions today, demonstrating improved coordination and recruitment of hip stability musclulature and deep core muscles as well as decreased spasms through the thoracic multifidus. Continue per POC.    Clinical Presentation  Stable    Clinical Decision Making  Moderate    Rehab Potential  Good    Clinical Impairments Affecting Rehab Potential  Grade 3 tearing with delivery, diastasis recti, recent abdominal surgery    PT Frequency  1x / week    PT Duration  8 weeks    PT Treatment/Interventions  Biofeedback;Electrical Stimulation;Traction;Moist Heat;Functional mobility training;Therapeutic activities;Therapeutic exercise;Patient/family education;Neuromuscular re-education;Manual techniques;Scar mobilization;Energy conservation;Dry needling;Passive range of motion    PT Next Visit Plan  Internal release and upper-thoracic DN/SCM and pec and sub-occipitals release Increase TA     PT Home Exercise Plan  Child's pose, diastasis approximation, 3-way wall stretch and pelvic tilts in seated, piriformis  stretch, side-stretch, TA in quadruped, bird-dog, [id number]      Consulted and Agree with Plan of Care  Patient       Patient will benefit from skilled therapeutic intervention in order to improve the following deficits and impairments:  Decreased skin integrity, Increased fascial restricitons, Impaired sensation, Improper body mechanics, Pain, Decreased coordination, Decreased scar mobility, Increased muscle spasms, Impaired tone, Postural dysfunction, Decreased activity tolerance, Decreased range of motion, Decreased strength, Decreased balance, Difficulty walking  Visit Diagnosis: Other muscle spasm  Chronic midline low back pain without sciatica  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 Common bile duct stone   . Abnormal liver function tests   . Abnormal magnetic resonance imaging of liver   . Transaminitis 11/01/2017  . Third degree laceration of perineum during delivery, postpartum 10/06/2017  . Gestational hypertension 10/03/2017  . Abdominal pain affecting pregnancy 06/28/2017  . 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 Presence of subdermal contraceptive device 04/07/2014  . Sleep disturbance 11/03/2013  . Adjustment disorder with mixed anxiety and depressed mood 08/12/2013   Hannah Hancock DPT, ATC Hannah Hancock 02/12/2018, 10:11 PM  [facility] MAIN [facility] SERVICES [address] [place], [name], [address] Phone: [phone]   Fax:  [phone]  Name: Hannah Hancock MRN: [id number] Date of Birth: 05/03/1996

## 2018-02-11 NOTE — Patient Instructions (Signed)
           Stand with equal weight on both feet engage the pelvic floor and lower abdomen and then lunge with the same leg 5 times in each direction, keeping foot forward and balancing for a second before placing the foot down between repetitions.  _2-3__ reps _1__ times per day.

## 2018-02-18 ENCOUNTER — Ambulatory Visit: Payer: BLUE CROSS/BLUE SHIELD

## 2018-02-20 ENCOUNTER — Ambulatory Visit: Payer: BLUE CROSS/BLUE SHIELD | Attending: Certified Nurse Midwife

## 2018-02-20 DIAGNOSIS — K5902 Outlet dysfunction constipation: Secondary | ICD-10-CM | POA: Insufficient documentation

## 2018-02-20 DIAGNOSIS — G8929 Other chronic pain: Secondary | ICD-10-CM | POA: Insufficient documentation

## 2018-02-20 DIAGNOSIS — M6208 Separation of muscle (nontraumatic), other site: Secondary | ICD-10-CM | POA: Insufficient documentation

## 2018-02-20 DIAGNOSIS — M62838 Other muscle spasm: Secondary | ICD-10-CM | POA: Diagnosis present

## 2018-02-20 DIAGNOSIS — M545 Low back pain: Secondary | ICD-10-CM | POA: Insufficient documentation

## 2018-02-20 NOTE — Patient Instructions (Signed)
     Stand with equal weight on both feet engage the pelvic floor and lower abdomen and then lunge with the same leg 5 times in each direction, keeping foot forward and balancing for a second before placing the foot down between repetitions.  __2_ reps __1-2_ times per day.   Inhale as you squat down (less than shown in picture) then lean forward and finally start to exhale just before and while you use your glutes to bring you off of the wall without using momentum (hands in prayer pose)

## 2018-02-20 NOTE — Therapy (Signed)
[facility] MAIN [facility] SERVICES [address] [place], [name], [address] Phone: [phone]   Fax:  [phone]  Physical Therapy Treatment and Re-assessment  Patient Details  Name: Hannah Hancock MRN: [id number] Date of Birth: Aug 19, 1996 Referring Provider: Genia Del   Encounter Date: 02/20/2018  PT End of Session - 02/20/18 1508    Visit Number  9    Number of Visits  12    Date for PT Re-Evaluation  05/08/18    Authorization Type  Medicaid    Authorization Time Period  through 03-03-2018    Authorization - Visit Number  9    Authorization - Number of Visits  12    PT Start Time  0208    PT Stop Time  0308    PT Time Calculation (min)  60 min    Activity Tolerance  Patient tolerated treatment well    Behavior During Therapy  [facility] for tasks assessed/performed       Past Medical History:  Diagnosis Date  . Abdominal pain, recurrent   . 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 Anxiety   . Asthma    no meds, no recent attacks  . Gallstones   . Tendinitis    R hip  . Urinary tract infection    Last one 4 months ago  . Weight loss     Past Surgical History:  Procedure Laterality Date  . ADENOIDECTOMY    . CHOLECYSTECTOMY N/A 11/03/2017   Procedure: LAPAROSCOPIC CHOLECYSTECTOMY;  Surgeon: Carolan Shiver, MD;  Location: [facility] ORS;  Service: General;  Laterality: N/A;  . ENDOSCOPIC RETROGRADE CHOLANGIOPANCREATOGRAPHY (ERCP) WITH PROPOFOL N/A 11/02/2017   Procedure: ENDOSCOPIC RETROGRADE CHOLANGIOPANCREATOGRAPHY (ERCP) WITH PROPOFOL;  Surgeon: Midge Minium, MD;  Location: [facility] ENDOSCOPY;  Service: Endoscopy;  Laterality: N/A;  . ESOPHAGOGASTRODUODENOSCOPY  10/20/2011   Procedure: ESOPHAGOGASTRODUODENOSCOPY (EGD);  Surgeon: Jon Gills, MD;  Location: [facility] OR;  Service: Gastroenterology;  Laterality: N/A;    There were no vitals filed for this visit.    Pelvic Floor Physical Therapy Treatment Note and  Re-assessment  SCREENING  Changes in medications, allergies, or medical history?: no   SUBJECTIVE  Patient reports: She is moving and it is stressful but she has been doing thumb work and it is getting a little easier. She tried having sex and it was painful deep but not as much at the opening as before. Going ~ every 3-4 days and straining and hard. No leakage  Precautions:  Breastfeeding.  Pain update:  Location of pain: low back Current pain:  0/10  Max pain:  4/10 Least pain:  0/10 Nature of pain: achy  Patient Goals: Decrease Urinary leakage, back pain,a and improve relaxation for intercourse.    OBJECTIVE  Changes in: Posture/Observations:  Anterior pelvic tilt and rounded shoulders.  Pelvic floor: Demonstrates much less tenderness with insertion and rotation of finger. Is able to relax to facilitate TP release much better. TTP on L through IC, OI, coccygeus, and PR anteriorly, on R through OI and Coccygeus only. Able to get a 2/5 (improved from 0/5) contraction following TP release, active release and lengthening were coordinated. Pt. Shows greater tenderness on L>R.  Abdominal:   Patient is able to contract TA to allow for transmission of force across the linea alba but is not using the muscles well with functional activities.  Gait Analysis:  Pt stands and walks with anterior pelvic tilt, weight shifted forward and slouched chest. Able to improve ~  50% with verbal cueing alone. Will require further intervention to decrease and prevent return of pain and spasm.  INTERVENTIONS THIS SESSION: Manual: re-assessed PFM and educated on coordination exercise. Performed TP release on L through IC, OI, coccygeus, and PR anteriorly, on R through OI and Coccygeus only. NM re-ed: educated on and practiced standing posture and gait with propper posture to decrease hyper kyphosis and lordosis and take pressure off of nerve roots innervating abdomen and pelvis. Self-care:  reviewed and expounded on the importance of getting enough fruits and vegetables as well as water intake to improve stool quality and decrease constipation. Also educated on a squatty-potty or stool to allow for PFM relaxation and less straining with BM's.  Total time: 60 min.                          PT Education - 02/20/18 1507    Education provided  Yes    Education Details  See Pt. Instructions and Interventions this session.    Person(s) Educated  Patient    Methods  Explanation;Demonstration;Verbal cues;Handout;Tactile cues    Comprehension  Verbalized understanding;Returned demonstration;Verbal cues required;Tactile cues required;Need further instruction       PT Short Term Goals - 02/20/18 1505      PT SHORT TERM GOAL #1   Title  Patient will demonstrate a coordinated contraction, relaxation, and bulge of the pelvic floor muscles to demonstrate functional recruitment and motion and allow for further strengthening.    Time  6    Period  Weeks    Status  Achieved    Target Date  01/10/18      PT SHORT TERM GOAL #2   Title  Patient will demonstrate improved pelvic alignment and balance of musculature surrounding the pelvis to facilitate decreased PFM spasms and decrease pelvic pain.    Time  3    Period  Weeks    Status  Achieved    Target Date  12/20/17      PT SHORT TERM GOAL #3   Title  Patient will report consistent use of foot-stool (squatty-potty) for positioning with BM to decrease pain with BM and intra-abdominal pressure.    Baseline  Pt. did not remember education on stool so it was reviewed this session.    Time  3    Period  Weeks    Status  On-going    Target Date  03/27/18      PT SHORT TERM GOAL #4   Title  Patient will demonstrate HEP x1 in the clinic to demonstrate understanding and proper form to allow for further improvement.    Time  3    Period  Weeks    Status  Achieved    Target Date  12/20/17        PT Long Term Goals  - 02/20/18 1347      PT LONG TERM GOAL #1   Title  Patient will report no episodes of SUI over the course of the prior two weeks to demonstrate improved functional ability.    Time  12    Period  Weeks    Status  Achieved    Target Date  05/01/18      PT LONG TERM GOAL #2   Title  Patient will score at or below 2/300 on the PFDI and 0/300 on the PFIQ to demonstrate a clinically meaningful decrease in disability and distress due to pelvic floor dysfunction.  Baseline  PFDI:47/300, PFIQ: 22/300, as of 02/20/18: PFDI: 31/300, PFIQ: 24/300    Time  12    Period  Weeks    Status  On-going    Target Date  05/01/18      PT LONG TERM GOAL #3   Title  Patient will report having BM's at least every-other day with consistency between [facility] stool scale 3-5 over the prior week to demonstrate decreased constipation.    Time  12    Period  Weeks    Status  On-going    Target Date  05/01/18      PT LONG TERM GOAL #4   Title  Patient will describe ability to have intercourse without vaginismus/penetration difficulty to demonstrate improved PFM relaxation and improved QOL.    Baseline  Decreased pain at the introitus but pain with deep thrusting.     Time  12    Period  Weeks    Status  On-going    Target Date  05/01/18      PT LONG TERM GOAL #5   Title  Patient will be able to stand or walf for greater than 2 hours without an increase in LBP greater than 2/10 to demonstrate improved quality of life and functional ability.    Baseline  1 hour of standing or walking increases pain to highest level (5/10) As of 02/20/18: Pain s now a 4 at worst when moving boxes etc.     Time  12    Period  Weeks    Status  On-going    Target Date  05/01/18            Plan - 02/20/18 1509    Clinical Impression Statement  Patient is making good rogress, she is no longer having SUI and her  apin is reduced. She is demonstrating decreased PFM spasm, especially around the posterior fourchette which is  allowing for more TP release of deeper PFM with success. She strength and coordination are improved but she continues to demonstrate some spasm and pain with intercourse. She will benefit from 10 more visits to build upon this success and completely reduce PFM spasm for decreased constipation and pain with intercourse. She will also require review of posture and reinforcement of dietary habit change to decrease constipation and on posture to further decrease back pain.      Clinical Presentation  Stable    Clinical Decision Making  Moderate    Rehab Potential  Good    Clinical Impairments Affecting Rehab Potential  Grade 3 tearing with delivery, diastasis recti, recent abdominal surgery    PT Frequency  1x / week    PT Duration  8 weeks 10 weeks    PT Treatment/Interventions  Biofeedback;Electrical Stimulation;Traction;Moist Heat;Functional mobility training;Therapeutic activities;Therapeutic exercise;Patient/family education;Neuromuscular re-education;Manual techniques;Scar mobilization;Energy conservation;Dry needling;Passive range of motion    PT Next Visit Plan  re-check thoracic tenderness and mobility, review posture in mirror and with AMB and mini-marches. further TP release internally PRN. educate further on kegels and give pictures. kegel+bridge.    PT Home Exercise Plan  Child's pose, diastasis approximation, 3-way wall stretch and pelvic tilts in seated, piriformis stretch, side-stretch, TA in quadruped, bird-dog, [id number]      Consulted and Agree with Plan of Care  Patient       Patient will benefit from skilled therapeutic intervention in order to improve the following deficits and impairments:  Decreased skin integrity, Increased fascial restricitons, Impaired sensation, Improper body mechanics, Pain, Decreased coordination,  Decreased scar mobility, Increased muscle spasms, Impaired tone, Postural dysfunction, Decreased activity tolerance, Decreased range of motion, Decreased strength,  Decreased balance, Difficulty walking  Visit Diagnosis: Other muscle spasm  Chronic midline low back pain without sciatica  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 Common bile duct stone   . Abnormal liver function tests   . Abnormal magnetic resonance imaging of liver   . Transaminitis 11/01/2017  . Third degree laceration of perineum during delivery, postpartum 10/06/2017  . Gestational hypertension 10/03/2017  . Abdominal pain affecting pregnancy 06/28/2017  . 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 Presence of subdermal contraceptive device 04/07/2014  . Sleep disturbance 11/03/2013  . Adjustment disorder with mixed anxiety and depressed mood 08/12/2013   Hannah Hancock DPT, ATC Hannah Hancock 02/20/2018, 3:17 PM  [place] MAIN [facility] SERVICES [address] [place], [name], [address] Phone: [phone]   Fax:  [phone]  Name: Hannah Hancock MRN: [id number] Date of Birth: 11/13/1995

## 2018-02-27 ENCOUNTER — Ambulatory Visit: Payer: BLUE CROSS/BLUE SHIELD

## 2018-03-18 ENCOUNTER — Ambulatory Visit: Payer: BLUE CROSS/BLUE SHIELD

## 2018-03-18 DIAGNOSIS — K5902 Outlet dysfunction constipation: Secondary | ICD-10-CM

## 2018-03-18 DIAGNOSIS — M6208 Separation of muscle (nontraumatic), other site: Secondary | ICD-10-CM

## 2018-03-18 DIAGNOSIS — M545 Low back pain: Secondary | ICD-10-CM

## 2018-03-18 DIAGNOSIS — G8929 Other chronic pain: Secondary | ICD-10-CM

## 2018-03-18 DIAGNOSIS — M62838 Other muscle spasm: Secondary | ICD-10-CM

## 2018-03-18 NOTE — Therapy (Signed)
[facility]IN [facility] [address] [place], [place], [address] Phone: [phone]   Fax:  [phone]  Physical Therapy Treatment  Patient Details  Name: Hannah MessingChasity D Willard MRN: [id number] Date of Birth: 03/18/1996 Referring Provider: Genia DelHaviland, Margaret   Encounter Date: 03/18/2018  PT End of Session - 03/18/18 1643    Visit Number  10    Number of Visits  22    Date for PT Re-Evaluation  05/08/18    Authorization Type  Medicaid    Authorization Time Period  Through 10-6    Authorization - Visit Number  1    Authorization - Number of Visits  10    PT Start Time  1300    PT Stop Time  1400    PT Time Calculation (min)  60 min    Activity Tolerance  Patient tolerated treatment well    Behavior During Therapy  [facility] for tasks assessed/performed       Past Medical History:  Diagnosis Date  . Abdominal pain, recurrent   . 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 Anxiety   . Asthma    no meds, no recent attacks  . Gallstones   . Tendinitis    R hip  . Urinary tract infection    Last one 4 months ago  . Weight loss     Past Surgical History:  Procedure Laterality Date  . ADENOIDECTOMY    . CHOLECYSTECTOMY N/A 11/03/2017   Procedure: LAPAROSCOPIC CHOLECYSTECTOMY;  Surgeon: Carolan Shiverintron-Diaz, Edgardo, MD;  Location: [facility] ORS;  Service: General;  Laterality: N/A;  . ENDOSCOPIC RETROGRADE CHOLANGIOPANCREATOGRAPHY (ERCP) WITH PROPOFOL N/A 11/02/2017   Procedure: ENDOSCOPIC RETROGRADE CHOLANGIOPANCREATOGRAPHY (ERCP) WITH PROPOFOL;  Surgeon: Midge MiniumWohl, Darren, MD;  Location: [facility] ENDOSCOPY;  Service: Endoscopy;  Laterality: N/A;  . ESOPHAGOGASTRODUODENOSCOPY  10/20/2011   Procedure: ESOPHAGOGASTRODUODENOSCOPY (EGD);  Surgeon: Jon GillsJoseph H. Clark, MD;  [facility];  Service: Gastroenterology;  Laterality: N/A;    There were no vitals filed for this visit.    Pelvic Floor Physical Therapy Treatment Note  SCREENING  Changes in  medications, allergies, or medical history?: no    SUBJECTIVE  Patient reports: She has not had any pain in the low back/pelvis has not had time to do self TP release with moving etc. Has some pain in both lower legs and does not know what it is from. Having a BM ~ every 2-3 days. Is drinking ~ 22 oz of water and ~ 3 8 oz glasses of tea a day. Has been eating a lot of cantaloupe. Is doing exercises 3-5 times per week  Precautions:  Breastfeeding  Pain update: No pain in LB or pelvis today.  Patient Goals: Decrease Urinary leakage, back pain,a and improve relaxation for intercourse.    OBJECTIVE  Changes in:  Pelvic floor: TTP throughout PFM with L>R, tight band of scar tissue at posterior fourchette, grade 1 pre-treatment, 2+/5 following treatment.   INTERVENTIONS THIS SESSION: Manual: Performed internal TP release B to all major areas woth L taking more time to release to decrease PFM spasms and pain with vaginal penetration. Reviewed with Pt. How and when to perform self posterior fourchette massage and where to buy a tool to allow for self internal TP release for continued spasm reduction.   Total time: 60 min.                          PT Education - 03/18/18 1643  Education provided  Yes    Education Details  See Pt. Instructions and Interventions this session    Person(s) Educated  Patient    Methods  Explanation;Verbal cues;Handout    Comprehension  Verbalized understanding;Verbal cues required       PT Short Term Goals - 02/20/18 1505      PT SHORT TERM GOAL #1   Title  Patient will demonstrate a coordinated contraction, relaxation, and bulge of the pelvic floor muscles to demonstrate functional recruitment and motion and allow for further strengthening.    Time  6    Period  Weeks    Status  Achieved    Target Date  01/10/18      PT SHORT TERM GOAL #2   Title  Patient will demonstrate improved pelvic alignment and balance of  musculature surrounding the pelvis to facilitate decreased PFM spasms and decrease pelvic pain.    Time  3    Period  Weeks    Status  Achieved    Target Date  12/20/17      PT SHORT TERM GOAL #3   Title  Patient will report consistent use of foot-stool (squatty-potty) for positioning with BM to decrease pain with BM and intra-abdominal pressure.    Baseline  Pt. did not remember education on stool so it was reviewed this session.    Time  3    Period  Weeks    Status  On-going    Target Date  03/27/18      PT SHORT TERM GOAL #4   Title  Patient will demonstrate HEP x1 in the clinic to demonstrate understanding and proper form to allow for further improvement.    Time  3    Period  Weeks    Status  Achieved    Target Date  12/20/17        PT Long Term Goals - 02/20/18 1347      PT LONG TERM GOAL #1   Title  Patient will report no episodes of SUI over the course of the prior two weeks to demonstrate improved functional ability.    Time  12    Period  Weeks    Status  Achieved    Target Date  05/01/18      PT LONG TERM GOAL #2   Title  Patient will score at or below 2/300 on the PFDI and 0/300 on the PFIQ to demonstrate a clinically meaningful decrease in disability and distress due to pelvic floor dysfunction.    Baseline  PFDI:47/300, PFIQ: 22/300, as of 02/20/18: PFDI: 31/300, PFIQ: 24/300    Time  12    Period  Weeks    Status  On-going    Target Date  05/01/18      PT LONG TERM GOAL #3   Title  Patient will report having BM's at least every-other day with consistency between [facility] stool scale 3-5 over the prior week to demonstrate decreased constipation.    Time  12    Period  Weeks    Status  On-going    Target Date  05/01/18      PT LONG TERM GOAL #4   Title  Patient will describe ability to have intercourse without vaginismus/penetration difficulty to demonstrate improved PFM relaxation and improved QOL.    Baseline  Decreased pain at the introitus but pain  with deep thrusting.     Time  12    Period  Weeks    Status  On-going    Target Date  05/01/18      PT LONG TERM GOAL #5   Title  Patient will be able to stand or walf for greater than 2 hours without an increase in LBP greater than 2/10 to demonstrate improved quality of life and functional ability.    Baseline  1 hour of standing or walking increases pain to highest level (5/10) As of 02/20/18: Pain s now a 4 at worst when moving boxes etc.     Time  12    Period  Weeks    Status  On-going    Target Date  05/01/18            Plan - 03/18/18 1644    Clinical Impression Statement  Pt. is able to tolerate internal TP release far better than previously and demonstrated complete reduction of TPs in all major areas except for the posterior fourchette today, which was left for the Pt. to complete at home because she can reach it easily. She demonstrated understanding of all education provided. Continue per POC.     Clinical Presentation  Stable    Clinical Decision Making  Moderate    Rehab Potential  Good    Clinical Impairments Affecting Rehab Potential  Grade 3 tearing with delivery, diastasis recti, recent abdominal surgery    PT Frequency  1x / week    PT Duration  8 weeks    PT Treatment/Interventions  Biofeedback;Electrical Stimulation;Traction;Moist Heat;Functional mobility training;Therapeutic activities;Therapeutic exercise;Patient/family education;Neuromuscular re-education;Manual techniques;Scar mobilization;Energy conservation;Dry needling;Passive range of motion    PT Next Visit Plan  re-check thoracic tenderness and mobility, review posture in mirror and with AMB and mini-marches. further TP release internally PRN. educate further on kegels and give pictures. kegel+bridge.    PT Home Exercise Plan  Child's pose, diastasis approximation, 3-way wall stretch and pelvic tilts in seated, piriformis stretch, side-stretch, TA in quadruped, bird-dog, [id number]      Consulted and Agree  with Plan of Care  Patient       Patient will benefit from skilled therapeutic intervention in order to improve the following deficits and impairments:  Decreased skin integrity, Increased fascial restricitons, Impaired sensation, Improper body mechanics, Pain, Decreased coordination, Decreased scar mobility, Increased muscle spasms, Impaired tone, Postural dysfunction, Decreased activity tolerance, Decreased range of motion, Decreased strength, Decreased balance, Difficulty walking  Visit Diagnosis: Other muscle spasm  Chronic midline low back pain without sciatica  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 Common bile duct stone   . Abnormal liver function tests   . Abnormal magnetic resonance imaging of liver   . Transaminitis 11/01/2017  . Third degree laceration of perineum during delivery, postpartum 10/06/2017  . Gestational hypertension 10/03/2017  . Abdominal pain affecting pregnancy 06/28/2017  . 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 Presence of subdermal contraceptive device 04/07/2014  . Sleep disturbance 11/03/2013  . Adjustment disorder with mixed anxiety and depressed mood 08/12/2013   Cleophus Molt DPT, ATC Cleophus Molt 03/18/2018, 4:50 PM  [facility] MAIN [facility] SERVICES [address] [place], [name], [address] Phone: [phone]   Fax:  [phone]  Name: ROSELY FERNANDEZ MRN: [id number] Date of Birth: 1995/11/17

## 2018-03-18 NOTE — Patient Instructions (Addendum)
  Female version: [employer]gee Classic (not plus!) THIS IS HARDER TO FIND   Female version: Dr. Alonna MiniumJoel Kaplan Premium Prostate Massager  SOLD AT [employer] [place]   Self Posterior Fourchette Stretching/Mobilization    1) Wash your hands and prop your body up so you can easily reach the vagina, bring hand-held mirror if desired.  2) Apply lubricant to the thumb and vaginal opening  3) Place thumb ~ 1/2 an inch into the vagina with the pad of the thumb pointed down and apply gentle pressure to the posterior fourchette.  4) Gently sweep the thumb side to side and in/out while maintaining pressure down toward the anus. Make sure the pressure is not so great that your muscles tighten up and guard, just enough to create slight discomfort.  Do this for ~ 3 min. Per night to decrease tightness and tenderness at the vaginal opening.

## 2018-03-25 ENCOUNTER — Ambulatory Visit: Payer: BLUE CROSS/BLUE SHIELD | Attending: Certified Nurse Midwife

## 2018-03-25 DIAGNOSIS — K5902 Outlet dysfunction constipation: Secondary | ICD-10-CM | POA: Insufficient documentation

## 2018-03-25 DIAGNOSIS — M545 Low back pain: Secondary | ICD-10-CM | POA: Insufficient documentation

## 2018-03-25 DIAGNOSIS — M62838 Other muscle spasm: Secondary | ICD-10-CM | POA: Diagnosis not present

## 2018-03-25 DIAGNOSIS — G8929 Other chronic pain: Secondary | ICD-10-CM | POA: Diagnosis present

## 2018-03-25 DIAGNOSIS — M6208 Separation of muscle (nontraumatic), other site: Secondary | ICD-10-CM | POA: Diagnosis present

## 2018-03-25 NOTE — Therapy (Signed)
[facility] [address] [place], [place], [address] Phone: [phone]   Fax:  [phone]  Physical Therapy Treatment  Patient Details  Name: Hannah Hancock MRN: [id number] Date of Birth: 05/16/1996 Referring Provider: Genia DelHaviland, Margaret   Encounter Date: 03/25/2018  PT End of Session - 03/27/18 1156    Visit Number  11    Number of Visits  22    Date for PT Re-Evaluation  05/26/18    Authorization Type  Medicaid    Authorization Time Period  Through 10-6    Authorization - Visit Number  2    Authorization - Number of Visits  10    PT Start Time  1600    PT Stop Time  1700    PT Time Calculation (min)  60 min    Activity Tolerance  Patient tolerated treatment well    Behavior During Therapy  [facility] for tasks assessed/performed       Past Medical History:  Diagnosis Date  . Abdominal pain, recurrent   . 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 Anxiety   . Asthma    no meds, no recent attacks  . Gallstones   . Tendinitis    R hip  . Urinary tract infection    Last one 4 months ago  . Weight loss     Past Surgical History:  Procedure Laterality Date  . ADENOIDECTOMY    . CHOLECYSTECTOMY N/A 11/03/2017   Procedure: LAPAROSCOPIC CHOLECYSTECTOMY;  Surgeon: Carolan Shiverintron-Diaz, Edgardo, MD;  Location: [facility] ORS;  Service: General;  Laterality: N/A;  . ENDOSCOPIC RETROGRADE CHOLANGIOPANCREATOGRAPHY (ERCP) WITH PROPOFOL N/A 11/02/2017   Procedure: ENDOSCOPIC RETROGRADE CHOLANGIOPANCREATOGRAPHY (ERCP) WITH PROPOFOL;  Surgeon: Midge MiniumWohl, Darren, MD;  Location: [facility] ENDOSCOPY;  Service: Endoscopy;  Laterality: N/A;  . ESOPHAGOGASTRODUODENOSCOPY  10/20/2011   Procedure: ESOPHAGOGASTRODUODENOSCOPY (EGD);  Surgeon: Jon GillsJoseph H. Clark, MD;  [facility];  Service: Gastroenterology;  Laterality: N/A;    There were no vitals filed for this visit.    Pelvic Floor Physical Therapy Treatment Note  SCREENING  Changes in  medications, allergies, or medical history?: no    SUBJECTIVE  Patient reports: Has not tried intercourse, lack of desire. Has days where she can feel the PFM tighten up when she is sitting still. Has not been able to buy the tool yet but is doing the external and opening TP release and feels that it tenses back up in-between.    Precautions:  Breastfeeding  Pain update: No pain  Patient Goals: Decrease Urinary leakage, back pain,a and improve relaxation for intercourse.   OBJECTIVE  Changes in: Posture/Observations:  Forward rolled shoulders and anterior pelvic tilt, hyperextended knees when not concentrating. Has difficulty engaging TA for mini-marches on R>L with improved ability following manual and Dry needling.  Palpation: TTP through R QL, Lumbar paraspinals, Lumbar multifidus.  Gait Analysis: Feet "drop" rather than roll through, knees locked on initial load acceptance heel-strike, heavy steps.   INTERVENTIONS THIS SESSION: Therex: reviewed  Mini-marches and bird-dog for form and to improve recruitment and strength of deep core stabilizers Manual: performed STM and TP release to R QL, Lumbar paraspinals, Lumbar multifidus to decrease spasm and decrease pressure on nerves as they course toward the PFM for decreased PFM spasm. Dry needling: performed standard approach TPDN with a .25x3160mm needle to to R QL, Lumbar paraspinals, Lumbar multifidus to decrease spasm and decrease pressure on nerves as they course toward the PFM for decreased PFM spasm. There-act:  Practiced standing posture in the mirror and walking both with and without mirror, emphasixing rolling off the toes, loading gradually, not hyperextending knees, and neutral pelvic tilt. Will require review.  Total time: 60 min.                         PT Education - 03/27/18 1155    Education provided  Yes    Education Details  See Interventions this session.    Person(s) Educated  Patient     Methods  Explanation;Demonstration;Tactile cues;Verbal cues    Comprehension  Verbalized understanding;Returned demonstration;Verbal cues required;Tactile cues required;Need further instruction       PT Short Term Goals - 02/20/18 1505      PT SHORT TERM GOAL #1   Title  Patient will demonstrate a coordinated contraction, relaxation, and bulge of the pelvic floor muscles to demonstrate functional recruitment and motion and allow for further strengthening.    Time  6    Period  Weeks    Status  Achieved    Target Date  01/10/18      PT SHORT TERM GOAL #2   Title  Patient will demonstrate improved pelvic alignment and balance of musculature surrounding the pelvis to facilitate decreased PFM spasms and decrease pelvic pain.    Time  3    Period  Weeks    Status  Achieved    Target Date  12/20/17      PT SHORT TERM GOAL #3   Title  Patient will report consistent use of foot-stool (squatty-potty) for positioning with BM to decrease pain with BM and intra-abdominal pressure.    Baseline  Pt. did not remember education on stool so it was reviewed this session.    Time  3    Period  Weeks    Status  On-going    Target Date  03/27/18      PT SHORT TERM GOAL #4   Title  Patient will demonstrate HEP x1 in the clinic to demonstrate understanding and proper form to allow for further improvement.    Time  3    Period  Weeks    Status  Achieved    Target Date  12/20/17        PT Long Term Goals - 02/20/18 1347      PT LONG TERM GOAL #1   Title  Patient will report no episodes of SUI over the course of the prior two weeks to demonstrate improved functional ability.    Time  12    Period  Weeks    Status  Achieved    Target Date  05/01/18      PT LONG TERM GOAL #2   Title  Patient will score at or below 2/300 on the PFDI and 0/300 on the PFIQ to demonstrate a clinically meaningful decrease in disability and distress due to pelvic floor dysfunction.    Baseline  PFDI:47/300, PFIQ:  22/300, as of 02/20/18: PFDI: 31/300, PFIQ: 24/300    Time  12    Period  Weeks    Status  On-going    Target Date  05/01/18      PT LONG TERM GOAL #3   Title  Patient will report having BM's at least every-other day with consistency between [facility] stool scale 3-5 over the prior week to demonstrate decreased constipation.    Time  12    Period  Weeks    Status  On-going  Target Date  05/01/18      PT LONG TERM GOAL #4   Title  Patient will describe ability to have intercourse without vaginismus/penetration difficulty to demonstrate improved PFM relaxation and improved QOL.    Baseline  Decreased pain at the introitus but pain with deep thrusting.     Time  12    Period  Weeks    Status  On-going    Target Date  05/01/18      PT LONG TERM GOAL #5   Title  Patient will be able to stand or walf for greater than 2 hours without an increase in LBP greater than 2/10 to demonstrate improved quality of life and functional ability.    Baseline  1 hour of standing or walking increases pain to highest level (5/10) As of 02/20/18: Pain s now a 4 at worst when moving boxes etc.     Time  12    Period  Weeks    Status  On-going    Target Date  05/01/18            Plan - 03/27/18 1156    Clinical Impression Statement  Pt. responded well to all interventions today, demonstrating decreased spasms and improved ability to recruit deep core stability muscles for correct exercise performance as well as understanding of all education provided. Continue per POC.    Clinical Presentation  Stable    Clinical Decision Making  Moderate    Rehab Potential  Good    Clinical Impairments Affecting Rehab Potential  Grade 3 tearing with delivery, diastasis recti, recent abdominal surgery    PT Frequency  1x / week    PT Duration  Other (comment) 10 weeks    PT Treatment/Interventions  Biofeedback;Electrical Stimulation;Traction;Moist Heat;Functional mobility training;Therapeutic activities;Therapeutic  exercise;Patient/family education;Neuromuscular re-education;Manual techniques;Scar mobilization;Energy conservation;Dry needling;Passive range of motion    PT Next Visit Plan  re-check thoracic tenderness and mobility, review posture in mirror and with AMB and mini-marches. further TP release internally PRN. educate further on kegels and give pictures. kegel+bridge.    PT Home Exercise Plan  Child's pose, diastasis approximation, 3-way wall stretch and pelvic tilts in seated, piriformis stretch, side-stretch, TA in quadruped, bird-dog, [id number]      Consulted and Agree with Plan of Care  Patient       Patient will benefit from skilled therapeutic intervention in order to improve the following deficits and impairments:  Decreased skin integrity, Increased fascial restricitons, Impaired sensation, Improper body mechanics, Pain, Decreased coordination, Decreased scar mobility, Increased muscle spasms, Impaired tone, Postural dysfunction, Decreased activity tolerance, Decreased range of motion, Decreased strength, Decreased balance, Difficulty walking  Visit Diagnosis: Other muscle spasm  Chronic midline low back pain without sciatica  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 Common bile duct stone   . Abnormal liver function tests   . Abnormal magnetic resonance imaging of liver   . Transaminitis 11/01/2017  . Third degree laceration of perineum during delivery, postpartum 10/06/2017  . Gestational hypertension 10/03/2017  . Abdominal pain affecting pregnancy 06/28/2017  . 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 Presence of subdermal contraceptive device 04/07/2014  . Sleep disturbance 11/03/2013  . Adjustment disorder with mixed anxiety and depressed mood 08/12/2013   Cleophus Molt DPT, ATC Cleophus Molt 03/27/2018, 9:51 PM  [facility] MAIN [facility]  SERVICES [address]  [place], [name], [address] Phone: [phone]   Fax:  [phone]  Name: Hannah Hancock MRN: [id number] Date of Birth: 07/29/1996

## 2018-04-03 ENCOUNTER — Ambulatory Visit: Payer: BLUE CROSS/BLUE SHIELD

## 2018-04-04 ENCOUNTER — Ambulatory Visit: Payer: BLUE CROSS/BLUE SHIELD

## 2018-04-04 DIAGNOSIS — M62838 Other muscle spasm: Secondary | ICD-10-CM

## 2018-04-04 DIAGNOSIS — K5902 Outlet dysfunction constipation: Secondary | ICD-10-CM

## 2018-04-04 DIAGNOSIS — M545 Low back pain, unspecified: Secondary | ICD-10-CM

## 2018-04-04 DIAGNOSIS — G8929 Other chronic pain: Secondary | ICD-10-CM

## 2018-04-04 DIAGNOSIS — M6208 Separation of muscle (nontraumatic), other site: Secondary | ICD-10-CM

## 2018-04-04 NOTE — Therapy (Signed)
[facility] [address] [place], [place], [address] Phone: [phone]   Fax:  [phone]  Physical Therapy Treatment  Patient Details  Name: Hannah Hancock MRN: [id number] Date of Birth: 04/12/1996 Referring Provider: Genia DelHaviland, Margaret   Encounter Date: 04/04/2018  PT End of Session - 04/05/18 2156    Visit Number  12    Number of Visits  22    Date for PT Re-Evaluation  05/26/18    Authorization Type  Medicaid    Authorization Time Period  Through 10-6    Authorization - Visit Number  3    Authorization - Number of Visits  10    PT Start Time  1630    PT Stop Time  1730    PT Time Calculation (min)  60 min    Activity Tolerance  Patient tolerated treatment well    Behavior During Therapy  [facility] for tasks assessed/performed       Past Medical History:  Diagnosis Date  . Abdominal pain, recurrent   . 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 Anxiety   . Asthma    no meds, no recent attacks  . Gallstones   . Tendinitis    R hip  . Urinary tract infection    Last one 4 months ago  . Weight loss     Past Surgical History:  Procedure Laterality Date  . ADENOIDECTOMY    . CHOLECYSTECTOMY N/A 11/03/2017   Procedure: LAPAROSCOPIC CHOLECYSTECTOMY;  Surgeon: Carolan Shiverintron-Diaz, Edgardo, MD;  Location: [facility] ORS;  Service: General;  Laterality: N/A;  . ENDOSCOPIC RETROGRADE CHOLANGIOPANCREATOGRAPHY (ERCP) WITH PROPOFOL N/A 11/02/2017   Procedure: ENDOSCOPIC RETROGRADE CHOLANGIOPANCREATOGRAPHY (ERCP) WITH PROPOFOL;  Surgeon: Midge MiniumWohl, Darren, MD;  Location: [facility] ENDOSCOPY;  Service: Endoscopy;  Laterality: N/A;  . ESOPHAGOGASTRODUODENOSCOPY  10/20/2011   Procedure: ESOPHAGOGASTRODUODENOSCOPY (EGD);  Surgeon: Jon GillsJoseph H. Clark, MD;  [facility];  Service: Gastroenterology;  Laterality: N/A;    There were no vitals filed for this visit.    Pelvic Floor Physical Therapy Treatment Note  SCREENING  Changes in  medications, allergies, or medical history?: no   SUBJECTIVE  Patient reports: Has been focusing on her posture and is still struggling to heel-toe walk. Has been able to do exercises at least 3 times over past week cannot afford the tool or arch supports right now.  Precautions:  Breastfeeding.  Pain update: No pain.  Patient Goals: Decrease Urinary leakage, back pain,a and improve relaxation for intercourse.   OBJECTIVE  Changes in: Pelvic floor: Decreased vaginismus with initial insertion and Pt. Is able to tolerate TP release better, with faster resoluton of spasms internally.  INTERVENTIONS THIS SESSION: Manual: performed internal TP release to all major areas B to decrease pain and spasm, reviewed the importance of performing posterior fourchette massage to lengthen scar tissue and decrease pressure on nerves in perenium.    Total time: 60 min.                          PT Education - 04/05/18 2155    Education provided  Yes    Education Details  Educated on the importance of attaining arch supports and decreasing stess levels to help decrease tension and spasm.    Person(s) Educated  Patient    Methods  Explanation    Comprehension  Verbalized understanding       PT Short Term Goals - 02/20/18 1505  PT SHORT TERM GOAL #1   Title  Patient will demonstrate a coordinated contraction, relaxation, and bulge of the pelvic floor muscles to demonstrate functional recruitment and motion and allow for further strengthening.    Time  6    Period  Weeks    Status  Achieved    Target Date  01/10/18      PT SHORT TERM GOAL #2   Title  Patient will demonstrate improved pelvic alignment and balance of musculature surrounding the pelvis to facilitate decreased PFM spasms and decrease pelvic pain.    Time  3    Period  Weeks    Status  Achieved    Target Date  12/20/17      PT SHORT TERM GOAL #3   Title  Patient will report consistent use of  foot-stool (squatty-potty) for positioning with BM to decrease pain with BM and intra-abdominal pressure.    Baseline  Pt. did not remember education on stool so it was reviewed this session.    Time  3    Period  Weeks    Status  On-going    Target Date  03/27/18      PT SHORT TERM GOAL #4   Title  Patient will demonstrate HEP x1 in the clinic to demonstrate understanding and proper form to allow for further improvement.    Time  3    Period  Weeks    Status  Achieved    Target Date  12/20/17        PT Long Term Goals - 02/20/18 1347      PT LONG TERM GOAL #1   Title  Patient will report no episodes of SUI over the course of the prior two weeks to demonstrate improved functional ability.    Time  12    Period  Weeks    Status  Achieved    Target Date  05/01/18      PT LONG TERM GOAL #2   Title  Patient will score at or below 2/300 on the PFDI and 0/300 on the PFIQ to demonstrate a clinically meaningful decrease in disability and distress due to pelvic floor dysfunction.    Baseline  PFDI:47/300, PFIQ: 22/300, as of 02/20/18: PFDI: 31/300, PFIQ: 24/300    Time  12    Period  Weeks    Status  On-going    Target Date  05/01/18      PT LONG TERM GOAL #3   Title  Patient will report having BM's at least every-other day with consistency between [facility] stool scale 3-5 over the prior week to demonstrate decreased constipation.    Time  12    Period  Weeks    Status  On-going    Target Date  05/01/18      PT LONG TERM GOAL #4   Title  Patient will describe ability to have intercourse without vaginismus/penetration difficulty to demonstrate improved PFM relaxation and improved QOL.    Baseline  Decreased pain at the introitus but pain with deep thrusting.     Time  12    Period  Weeks    Status  On-going    Target Date  05/01/18      PT LONG TERM GOAL #5   Title  Patient will be able to stand or walf for greater than 2 hours without an increase in LBP greater than 2/10 to  demonstrate improved quality of life and functional ability.    Baseline  1  hour of standing or walking increases pain to highest level (5/10) As of 02/20/18: Pain s now a 4 at worst when moving boxes etc.     Time  12    Period  Weeks    Status  On-going    Target Date  05/01/18            Plan - 04/05/18 2157    Clinical Impression Statement  Pt. responded well to all interventions today and demonstrated decreased spasms and pain following treatment as well as understanding of all education provided. Continue per POC.    Clinical Presentation  Stable    Clinical Decision Making  Moderate    Rehab Potential  Good    Clinical Impairments Affecting Rehab Potential  Grade 3 tearing with delivery, diastasis recti, recent abdominal surgery    PT Frequency  1x / week    PT Duration  Other (comment)    PT Treatment/Interventions  Biofeedback;Electrical Stimulation;Traction;Moist Heat;Functional mobility training;Therapeutic activities;Therapeutic exercise;Patient/family education;Neuromuscular re-education;Manual techniques;Scar mobilization;Energy conservation;Dry needling;Passive range of motion    PT Next Visit Plan  scar and fascial mobility around perineum    PT Home Exercise Plan  Child's pose, diastasis approximation, 3-way wall stretch and pelvic tilts in seated, piriformis stretch, side-stretch, TA in quadruped, bird-dog, [id number]      Consulted and Agree with Plan of Care  Patient       Patient will benefit from skilled therapeutic intervention in order to improve the following deficits and impairments:  Decreased skin integrity, Increased fascial restricitons, Impaired sensation, Improper body mechanics, Pain, Decreased coordination, Decreased scar mobility, Increased muscle spasms, Impaired tone, Postural dysfunction, Decreased activity tolerance, Decreased range of motion, Decreased strength, Decreased balance, Difficulty walking  Visit Diagnosis: Other muscle spasm  Chronic  midline low back pain without sciatica  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 Common bile duct stone   . Abnormal liver function tests   . Abnormal magnetic resonance imaging of liver   . Transaminitis 11/01/2017  . Third degree laceration of perineum during delivery, postpartum 10/06/2017  . Gestational hypertension 10/03/2017  . Abdominal pain affecting pregnancy 06/28/2017  . 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 Presence of subdermal contraceptive device 04/07/2014  . Sleep disturbance 11/03/2013  . Adjustment disorder with mixed anxiety and depressed mood 08/12/2013   Cleophus Molt DPT, ATC Cleophus Molt 04/05/2018, 10:02 PM  [place] [facility] MAIN [facility] SERVICES [address] [place], [name], [address] Phone: [phone]   Fax:  [phone]  Name: Hannah Hancock MRN: [id number] Date of Birth: [birth year]

## 2018-04-10 ENCOUNTER — Ambulatory Visit: Payer: BLUE CROSS/BLUE SHIELD

## 2018-04-10 DIAGNOSIS — M545 Low back pain, unspecified: Secondary | ICD-10-CM

## 2018-04-10 DIAGNOSIS — M62838 Other muscle spasm: Secondary | ICD-10-CM | POA: Diagnosis not present

## 2018-04-10 DIAGNOSIS — G8929 Other chronic pain: Secondary | ICD-10-CM

## 2018-04-10 DIAGNOSIS — K5902 Outlet dysfunction constipation: Secondary | ICD-10-CM

## 2018-04-10 DIAGNOSIS — M6208 Separation of muscle (nontraumatic), other site: Secondary | ICD-10-CM

## 2018-04-10 NOTE — Therapy (Signed)
[place] [facility] MAIN [facility] SERVICES [address] [place], [name], [address] Phone: [phone]   Fax:  [phone]  Physical Therapy Treatment  Patient Details  Name: Hannah Hancock MRN: [id number] Date of Birth: Jul 19, 1996 Referring Provider: Genia Del   Encounter Date: 04/10/2018  PT End of Session - 04/11/18 1317    Visit Number  13    Number of Visits  22    Date for PT Re-Evaluation  05/26/18    Authorization Type  Medicaid    Authorization Time Period  Through 10-6    Authorization - Visit Number  4    Authorization - Number of Visits  10    PT Start Time  1630    PT Stop Time  1730    PT Time Calculation (min)  60 min    Activity Tolerance  Patient tolerated treatment well    Behavior During Therapy  [facility] for tasks assessed/performed       Past Medical History:  Diagnosis Date  . Abdominal pain, recurrent   . 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 Anxiety   . Asthma    no meds, no recent attacks  . Gallstones   . Tendinitis    R hip  . Urinary tract infection    Last one 4 months ago  . Weight loss     Past Surgical History:  Procedure Laterality Date  . ADENOIDECTOMY    . CHOLECYSTECTOMY N/A 11/03/2017   Procedure: LAPAROSCOPIC CHOLECYSTECTOMY;  Surgeon: Carolan Shiver, MD;  Location: [facility] ORS;  Service: General;  Laterality: N/A;  . ENDOSCOPIC RETROGRADE CHOLANGIOPANCREATOGRAPHY (ERCP) WITH PROPOFOL N/A 11/02/2017   Procedure: ENDOSCOPIC RETROGRADE CHOLANGIOPANCREATOGRAPHY (ERCP) WITH PROPOFOL;  Surgeon: Midge Minium, MD;  Location: [facility] ENDOSCOPY;  Service: Endoscopy;  Laterality: N/A;  . ESOPHAGOGASTRODUODENOSCOPY  10/20/2011   Procedure: ESOPHAGOGASTRODUODENOSCOPY (EGD);  Surgeon: Jon Gills, MD;  Location: [facility] OR;  Service: Gastroenterology;  Laterality: N/A;    There were no vitals filed for this visit.    Pelvic Floor Physical Therapy Treatment Note  SCREENING  Changes in  medications, allergies, or medical history?: no   SUBJECTIVE  Patient reports: Her back has been hurting a little, she has been able to do a little more self release.  Precautions:  Breastfeeding  Pain update:  Location of pain: low back Current pain:  2/10  Max pain:  4/10 Least pain:  1/10 Nature of pain: achy  Patient Goals: Decrease Urinary leakage, back pain,a and improve relaxation for intercourse.   OBJECTIVE  Changes in: Posture/Observations:  Continues to pronate heavily B and is not wearing arch supports.  Palpation: Decreased mobility of fascia with burning sensation into the vagina with MFR around adductors and perineum. TTP B to adductors.   INTERVENTIONS THIS SESSION: Manual: Performed MFR and TP release to B adductors, perineum, and between labia majora and adductors to decrease tension of Deep Anterior Line of fascia to allow for decreased tightness of PFM. Self-care: reviewed and re-iterated the importance of wearing arch supports, supplied Pt. With a heel-lift due to report that the one she bought is ruined and she cannot afford to replace it. And reviewed the importance on altering which hip she holds her daughter on to minimize forces that are drawing her into poor pelvic alignment. Reviewed how to position baby wrap to disperse load appropriately. Therex: reviewed adductor stretches and educated on and practiced hip-flexor stretches to decrease pull into anterior tilt for decreased pain and tension on  lumbar nerve roots.  Total time: 60 min.                          PT Education - 04/10/18 1643    Education provided  Yes    Education Details  See Pt. Instructions and Interventions this session.    Person(s) Educated  Patient    Methods  Explanation;Demonstration;Verbal cues;Handout    Comprehension  Verbalized understanding;Returned demonstration;Verbal cues required       PT Short Term Goals - 02/20/18 1505      PT  SHORT TERM GOAL #1   Title  Patient will demonstrate a coordinated contraction, relaxation, and bulge of the pelvic floor muscles to demonstrate functional recruitment and motion and allow for further strengthening.    Time  6    Period  Weeks    Status  Achieved    Target Date  01/10/18      PT SHORT TERM GOAL #2   Title  Patient will demonstrate improved pelvic alignment and balance of musculature surrounding the pelvis to facilitate decreased PFM spasms and decrease pelvic pain.    Time  3    Period  Weeks    Status  Achieved    Target Date  12/20/17      PT SHORT TERM GOAL #3   Title  Patient will report consistent use of foot-stool (squatty-potty) for positioning with BM to decrease pain with BM and intra-abdominal pressure.    Baseline  Pt. did not remember education on stool so it was reviewed this session.    Time  3    Period  Weeks    Status  On-going    Target Date  03/27/18      PT SHORT TERM GOAL #4   Title  Patient will demonstrate HEP x1 in the clinic to demonstrate understanding and proper form to allow for further improvement.    Time  3    Period  Weeks    Status  Achieved    Target Date  12/20/17        PT Long Term Goals - 02/20/18 1347      PT LONG TERM GOAL #1   Title  Patient will report no episodes of SUI over the course of the prior two weeks to demonstrate improved functional ability.    Time  12    Period  Weeks    Status  Achieved    Target Date  05/01/18      PT LONG TERM GOAL #2   Title  Patient will score at or below 2/300 on the PFDI and 0/300 on the PFIQ to demonstrate a clinically meaningful decrease in disability and distress due to pelvic floor dysfunction.    Baseline  PFDI:47/300, PFIQ: 22/300, as of 02/20/18: PFDI: 31/300, PFIQ: 24/300    Time  12    Period  Weeks    Status  On-going    Target Date  05/01/18      PT LONG TERM GOAL #3   Title  Patient will report having BM's at least every-other day with consistency between [facility]  stool scale 3-5 over the prior week to demonstrate decreased constipation.    Time  12    Period  Weeks    Status  On-going    Target Date  05/01/18      PT LONG TERM GOAL #4   Title  Patient will describe ability to have intercourse without vaginismus/penetration  difficulty to demonstrate improved PFM relaxation and improved QOL.    Baseline  Decreased pain at the introitus but pain with deep thrusting.     Time  12    Period  Weeks    Status  On-going    Target Date  05/01/18      PT LONG TERM GOAL #5   Title  Patient will be able to stand or walf for greater than 2 hours without an increase in LBP greater than 2/10 to demonstrate improved quality of life and functional ability.    Baseline  1 hour of standing or walking increases pain to highest level (5/10) As of 02/20/18: Pain s now a 4 at worst when moving boxes etc.     Time  12    Period  Weeks    Status  On-going    Target Date  05/01/18            Plan - 04/11/18 1317    Clinical Impression Statement  Pt. responded well though slowly to manual treatment to improve mobility of fascia surrounding the vulva and to decrease spasms through adductors to allow for improved PFM relaxation, decreased constipation and decreased pain with vaginal penetration. She demonstrated decreased spasms, improved mobility, and understanding of all education provided. Continue per POC.    Clinical Presentation  Stable    Clinical Decision Making  Moderate    Rehab Potential  Good    Clinical Impairments Affecting Rehab Potential  Grade 3 tearing with delivery, diastasis recti, recent abdominal surgery    PT Frequency  1x / week    PT Duration  Other (comment)   10 weeks   PT Treatment/Interventions  Biofeedback;Electrical Stimulation;Traction;Moist Heat;Functional mobility training;Therapeutic activities;Therapeutic exercise;Patient/family education;Neuromuscular re-education;Manual techniques;Scar mobilization;Energy conservation;Dry  needling;Passive range of motion    PT Next Visit Plan  scar and fascial mobility around perineum, give handout for adductor stretch (ask about if she is doing wall-stretch version).    PT Home Exercise Plan  Child's pose, diastasis approximation, 3-way wall stretch and pelvic tilts in seated, piriformis stretch, side-stretch, TA in quadruped, bird-dog, [id number]      Consulted and Agree with Plan of Care  Patient       Patient will benefit from skilled therapeutic intervention in order to improve the following deficits and impairments:  Decreased skin integrity, Increased fascial restricitons, Impaired sensation, Improper body mechanics, Pain, Decreased coordination, Decreased scar mobility, Increased muscle spasms, Impaired tone, Postural dysfunction, Decreased activity tolerance, Decreased range of motion, Decreased strength, Decreased balance, Difficulty walking  Visit Diagnosis: Other muscle spasm  Chronic midline low back pain without sciatica  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 Common bile duct stone   . Abnormal liver function tests   . Abnormal magnetic resonance imaging of liver   . Transaminitis 11/01/2017  . Third degree laceration of perineum during delivery, postpartum 10/06/2017  . Gestational hypertension 10/03/2017  . Abdominal pain affecting pregnancy 06/28/2017  . 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 Presence of subdermal contraceptive device 04/07/2014  . Sleep disturbance 11/03/2013  . Adjustment disorder with mixed anxiety and depressed mood 08/12/2013   Cleophus MoltKeeli T. Gailes DPT, ATC Cleophus MoltKeeli T Gailes 04/11/2018, 1:29 PM  [place] [facility] MA[facility] [address] [place], [place], [address] Phone: [phone]   Fax:  [phone]  Name: Hannah Hancock MRN: [id number] Date  of Birth: [birth year]

## 2018-04-10 NOTE — Patient Instructions (Signed)
Flexors, Lunge  Hip Flexor Stretch: Proposal Pose    Maintain pelvic tuck under, lift pubic bone toward navel. Engage posterior hip muscles (firm glute muscles of leg in back position) and shift forward until you feel stretch on front of leg that is down. To increase stretch, maintain balance and ease hips forward. You may use one hand on a chair for balance if needed. Hold for __5__ breaths. Repeat __2-3__ times each leg.  Do _1-2__ times per day.   

## 2018-04-17 ENCOUNTER — Ambulatory Visit: Payer: BLUE CROSS/BLUE SHIELD

## 2018-04-17 DIAGNOSIS — G8929 Other chronic pain: Secondary | ICD-10-CM

## 2018-04-17 DIAGNOSIS — M545 Low back pain: Secondary | ICD-10-CM

## 2018-04-17 DIAGNOSIS — M62838 Other muscle spasm: Secondary | ICD-10-CM

## 2018-04-17 DIAGNOSIS — M6208 Separation of muscle (nontraumatic), other site: Secondary | ICD-10-CM

## 2018-04-17 DIAGNOSIS — K5902 Outlet dysfunction constipation: Secondary | ICD-10-CM

## 2018-04-17 NOTE — Therapy (Signed)
[place] [facility] MAIN [facility] SERVICES [address] [place], [name], [address] Phone: [phone]   Fax:  [phone]  Physical Therapy Treatment  Patient Details  Name: Hannah Hancock MRN: [id number] Date of Birth: Jan 04, 1996 Referring Provider: Genia Del   Encounter Date: 04/17/2018  PT End of Session - 04/19/18 1036    Visit Number  14    Number of Visits  22    Date for PT Re-Evaluation  05/26/18    Authorization Type  Medicaid    Authorization Time Period  Through 10-6    Authorization - Visit Number  5    Authorization - Number of Visits  10    PT Start Time  1625    PT Stop Time  1725    PT Time Calculation (min)  60 min    Activity Tolerance  Patient tolerated treatment well    Behavior During Therapy  [facility] for tasks assessed/performed       Past Medical History:  Diagnosis Date  . Abdominal pain, recurrent   . 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 Anxiety   . Asthma    no meds, no recent attacks  . Gallstones   . Tendinitis    R hip  . Urinary tract infection    Last one 4 months ago  . Weight loss     Past Surgical History:  Procedure Laterality Date  . ADENOIDECTOMY    . CHOLECYSTECTOMY N/A 11/03/2017   Procedure: LAPAROSCOPIC CHOLECYSTECTOMY;  Surgeon: Carolan Shiver, MD;  Location: [facility] ORS;  Service: General;  Laterality: N/A;  . ENDOSCOPIC RETROGRADE CHOLANGIOPANCREATOGRAPHY (ERCP) WITH PROPOFOL N/A 11/02/2017   Procedure: ENDOSCOPIC RETROGRADE CHOLANGIOPANCREATOGRAPHY (ERCP) WITH PROPOFOL;  Surgeon: Midge Minium, MD;  Location: [facility] ENDOSCOPY;  Service: Endoscopy;  Laterality: N/A;  . ESOPHAGOGASTRODUODENOSCOPY  10/20/2011   Procedure: ESOPHAGOGASTRODUODENOSCOPY (EGD);  Surgeon: Jon Gills, MD;  Location: [facility] OR;  Service: Gastroenterology;  Laterality: N/A;    There were no vitals filed for this visit.    Pelvic Floor Physical Therapy Treatment Note  SCREENING  Changes in  medications, allergies, or medical history?: no   SUBJECTIVE  Patient reports: She was able to buy some inserts but they want to slip out of the shoes. Had a back spasm yesterday that has made her have increased pain and her rotation ROM is decreased today. Has done exercises ~ 3 days over past week.  Precautions:  breasetfeeding  Pain update:  Location of pain: low back Current pain:  5/10  Max pain:  6/10 Least pain:  0/10 Nature of pain:sharp  Patient Goals: Decrease Urinary leakage, back pain,a and improve relaxation for intercourse.   OBJECTIVE  Changes in: Posture/Observations:  Mild hyperlordosis  Palpation: TTP tp L lumbar and Thoracic paraspinals and with MFR to area surrounding vulva    INTERVENTIONS THIS SESSION: Manual: Performed TP release to L Paraspinals through thoracic and lumbar region and MFR to Perenium and attached fascia through adductors B to decrease tension on nerves in posterior fourchette.  Dry-needle: Performed standard approach TPDN to thoracic and lumbar Paraspinals on L as well as ~ T 10 Multifidus to decrease pain and spasm as well as pressure on lumbar nerve roots to further decrease urinary leakage and pain with vaginal penetration. Therex: reviewed HEP stretches and alternates to the wall stretches. Emphasized importance of regular performance to decrease potential for spasm return.   Total time: 60 min.  PT Education - 04/19/18 1035    Education provided  Yes    Education Details  Reminded Pt. of importance of doing her stretches and exercises for her long-term relief.    Person(s) Educated  Patient    Methods  Explanation    Comprehension  Verbalized understanding       PT Short Term Goals - 02/20/18 1505      PT SHORT TERM GOAL #1   Title  Patient will demonstrate a coordinated contraction, relaxation, and bulge of the pelvic floor muscles to demonstrate functional recruitment and  motion and allow for further strengthening.    Time  6    Period  Weeks    Status  Achieved    Target Date  01/10/18      PT SHORT TERM GOAL #2   Title  Patient will demonstrate improved pelvic alignment and balance of musculature surrounding the pelvis to facilitate decreased PFM spasms and decrease pelvic pain.    Time  3    Period  Weeks    Status  Achieved    Target Date  12/20/17      PT SHORT TERM GOAL #3   Title  Patient will report consistent use of foot-stool (squatty-potty) for positioning with BM to decrease pain with BM and intra-abdominal pressure.    Baseline  Pt. did not remember education on stool so it was reviewed this session.    Time  3    Period  Weeks    Status  On-going    Target Date  03/27/18      PT SHORT TERM GOAL #4   Title  Patient will demonstrate HEP x1 in the clinic to demonstrate understanding and proper form to allow for further improvement.    Time  3    Period  Weeks    Status  Achieved    Target Date  12/20/17        PT Long Term Goals - 02/20/18 1347      PT LONG TERM GOAL #1   Title  Patient will report no episodes of SUI over the course of the prior two weeks to demonstrate improved functional ability.    Time  12    Period  Weeks    Status  Achieved    Target Date  05/01/18      PT LONG TERM GOAL #2   Title  Patient will score at or below 2/300 on the PFDI and 0/300 on the PFIQ to demonstrate a clinically meaningful decrease in disability and distress due to pelvic floor dysfunction.    Baseline  PFDI:47/300, PFIQ: 22/300, as of 02/20/18: PFDI: 31/300, PFIQ: 24/300    Time  12    Period  Weeks    Status  On-going    Target Date  05/01/18      PT LONG TERM GOAL #3   Title  Patient will report having BM's at least every-other day with consistency between [facility] stool scale 3-5 over the prior week to demonstrate decreased constipation.    Time  12    Period  Weeks    Status  On-going    Target Date  05/01/18      PT LONG TERM  GOAL #4   Title  Patient will describe ability to have intercourse without vaginismus/penetration difficulty to demonstrate improved PFM relaxation and improved QOL.    Baseline  Decreased pain at the introitus but pain with deep thrusting.     Time  12    Period  Weeks    Status  On-going    Target Date  05/01/18      PT LONG TERM GOAL #5   Title  Patient will be able to stand or walf for greater than 2 hours without an increase in LBP greater than 2/10 to demonstrate improved quality of life and functional ability.    Baseline  1 hour of standing or walking increases pain to highest level (5/10) As of 02/20/18: Pain s now a 4 at worst when moving boxes etc.     Time  12    Period  Weeks    Status  On-going    Target Date  05/01/18            Plan - 04/19/18 1037    Clinical Impression Statement  Pt. responded well to all interventions, showing decreased pain and spasm as well as improved fasical mobility surrounding perenium after treatment. Continue per POC.    Clinical Presentation  Stable    Clinical Decision Making  Moderate    Rehab Potential  Good    Clinical Impairments Affecting Rehab Potential  Grade 3 tearing with delivery, diastasis recti, recent abdominal surgery    PT Frequency  1x / week    PT Duration  Other (comment)   10 weeks   PT Treatment/Interventions  Biofeedback;Electrical Stimulation;Traction;Moist Heat;Functional mobility training;Therapeutic activities;Therapeutic exercise;Patient/family education;Neuromuscular re-education;Manual techniques;Scar mobilization;Energy conservation;Dry needling;Passive range of motion    PT Next Visit Plan  re-check low back for mobility and perform internal TP release.    PT Home Exercise Plan  Child's pose, diastasis approximation, 3-way wall stretch and pelvic tilts in seated, piriformis stretch, side-stretch, TA in quadruped, bird-dog, [id number]      Consulted and Agree with Plan of Care  Patient       Patient will  benefit from skilled therapeutic intervention in order to improve the following deficits and impairments:  Decreased skin integrity, Increased fascial restricitons, Impaired sensation, Improper body mechanics, Pain, Decreased coordination, Decreased scar mobility, Increased muscle spasms, Impaired tone, Postural dysfunction, Decreased activity tolerance, Decreased range of motion, Decreased strength, Decreased balance, Difficulty walking  Visit Diagnosis: Other muscle spasm  Chronic midline low back pain without sciatica  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 Common bile duct stone   . Abnormal liver function tests   . Abnormal magnetic resonance imaging of liver   . Transaminitis 11/01/2017  . Third degree laceration of perineum during delivery, postpartum 10/06/2017  . Gestational hypertension 10/03/2017  . Abdominal pain affecting pregnancy 06/28/2017  . 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 Presence of subdermal contraceptive device 04/07/2014  . Sleep disturbance 11/03/2013  . Adjustment disorder with mixed anxiety and depressed mood 08/12/2013   Hannah MoltKeeli T. Hancock DPT, ATC Hannah Hancock 04/19/2018, 10:41 AM  [place] [facility] MAIN [facility] [address] [place], [place], [id number] Phone: [phone]   Fax:  [phone]  Name: Hannah Hancock MRN: [id number] Date of Birth: 12/11/1995

## 2018-04-24 ENCOUNTER — Ambulatory Visit: Payer: BLUE CROSS/BLUE SHIELD | Attending: Certified Nurse Midwife

## 2018-04-24 DIAGNOSIS — K5902 Outlet dysfunction constipation: Secondary | ICD-10-CM | POA: Diagnosis present

## 2018-04-24 DIAGNOSIS — M62838 Other muscle spasm: Secondary | ICD-10-CM | POA: Diagnosis not present

## 2018-04-24 DIAGNOSIS — G8929 Other chronic pain: Secondary | ICD-10-CM | POA: Insufficient documentation

## 2018-04-24 DIAGNOSIS — M545 Low back pain: Secondary | ICD-10-CM | POA: Diagnosis present

## 2018-04-24 DIAGNOSIS — M6208 Separation of muscle (nontraumatic), other site: Secondary | ICD-10-CM | POA: Diagnosis present

## 2018-04-24 NOTE — Therapy (Signed)
[place] [facility] MAIN [facility] SERVICES [address] [place], [name], [address] Phone: [phone]   Fax:  [phone]  Physical Therapy Treatment  Patient Details  Name: Hannah Hancock MRN: [id number] Date of Birth: Oct 22, 1995 Referring Provider: Genia Del   Encounter Date: 04/24/2018    Past Medical History:  Diagnosis Date  . Abdominal pain, recurrent   . 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 Anxiety   . Asthma    no meds, no recent attacks  . Gallstones   . Tendinitis    R hip  . Urinary tract infection    Last one 4 months ago  . Weight loss     Past Surgical History:  Procedure Laterality Date  . ADENOIDECTOMY    . CHOLECYSTECTOMY N/A 11/03/2017   Procedure: LAPAROSCOPIC CHOLECYSTECTOMY;  Surgeon: Carolan Shiver, MD;  Location: [facility] ORS;  Service: General;  Laterality: N/A;  . ENDOSCOPIC RETROGRADE CHOLANGIOPANCREATOGRAPHY (ERCP) WITH PROPOFOL N/A 11/02/2017   Procedure: ENDOSCOPIC RETROGRADE CHOLANGIOPANCREATOGRAPHY (ERCP) WITH PROPOFOL;  Surgeon: Midge Minium, MD;  Location: [facility] ENDOSCOPY;  Service: Endoscopy;  Laterality: N/A;  . ESOPHAGOGASTRODUODENOSCOPY  10/20/2011   Procedure: ESOPHAGOGASTRODUODENOSCOPY (EGD);  Surgeon: Jon Gills, MD;  Location: [facility] OR;  Service: Gastroenterology;  Laterality: N/A;    There were no vitals filed for this visit.   Pelvic Floor Physical Therapy Treatment Note  SCREENING  Changes in medications, allergies, or medical history?: no    SUBJECTIVE  Patient reports: Was sore for 1-2 days following last treatment but is doing well with exercises. Has just a little urine that comes out right after standing from urinating but no other leakage. Has returned to exercising and is sore from this. Has a hard time holding a plank.   Precautions:  Breastfeeding  Pain update: no pain  Patient Goals: Decrease Urinary leakage, back pain,a and improve relaxation for  intercourse.   OBJECTIVE  Changes in: Posture/Observations:    Range of Motion/Flexibilty:    Strength/MMT:  LE MMT:  Pelvic floor:  Abdominal:   Palpation:  Gait Analysis:  INTERVENTIONS THIS SESSION: Therex: Educated on how to modify plank and exercise in general ( soda-can theory, etc) to do it safely without causing separation of diastasis or greater tightness in the PFM.  Manual: Performed scar mobility and TP release internally to all PFM to decrease pain and spasm with vaginal penetration and take pressure of of nerves to decrease post-void dribble.   Total time: 60 min.                             PT Short Term Goals - 02/20/18 1505      PT SHORT TERM GOAL #1   Title  Patient will demonstrate a coordinated contraction, relaxation, and bulge of the pelvic floor muscles to demonstrate functional recruitment and motion and allow for further strengthening.    Time  6    Period  Weeks    Status  Achieved    Target Date  01/10/18      PT SHORT TERM GOAL #2   Title  Patient will demonstrate improved pelvic alignment and balance of musculature surrounding the pelvis to facilitate decreased PFM spasms and decrease pelvic pain.    Time  3    Period  Weeks    Status  Achieved    Target Date  12/20/17      PT SHORT TERM GOAL #3   Title  Patient will report consistent use of foot-stool (squatty-potty) for positioning with BM to decrease pain with BM and intra-abdominal pressure.    Baseline  Pt. did not remember education on stool so it was reviewed this session.    Time  3    Period  Weeks    Status  On-going    Target Date  03/27/18      PT SHORT TERM GOAL #4   Title  Patient will demonstrate HEP x1 in the clinic to demonstrate understanding and proper form to allow for further improvement.    Time  3    Period  Weeks    Status  Achieved    Target Date  12/20/17        PT Long Term Goals - 02/20/18 1347      PT LONG TERM GOAL #1    Title  Patient will report no episodes of SUI over the course of the prior two weeks to demonstrate improved functional ability.    Time  12    Period  Weeks    Status  Achieved    Target Date  05/01/18      PT LONG TERM GOAL #2   Title  Patient will score at or below 2/300 on the PFDI and 0/300 on the PFIQ to demonstrate a clinically meaningful decrease in disability and distress due to pelvic floor dysfunction.    Baseline  PFDI:47/300, PFIQ: 22/300, as of 02/20/18: PFDI: 31/300, PFIQ: 24/300    Time  12    Period  Weeks    Status  On-going    Target Date  05/01/18      PT LONG TERM GOAL #3   Title  Patient will report having BM's at least every-other day with consistency between [facility] stool scale 3-5 over the prior week to demonstrate decreased constipation.    Time  12    Period  Weeks    Status  On-going    Target Date  05/01/18      PT LONG TERM GOAL #4   Title  Patient will describe ability to have intercourse without vaginismus/penetration difficulty to demonstrate improved PFM relaxation and improved QOL.    Baseline  Decreased pain at the introitus but pain with deep thrusting.     Time  12    Period  Weeks    Status  On-going    Target Date  05/01/18      PT LONG TERM GOAL #5   Title  Patient will be able to stand or walf for greater than 2 hours without an increase in LBP greater than 2/10 to demonstrate improved quality of life and functional ability.    Baseline  1 hour of standing or walking increases pain to highest level (5/10) As of 02/20/18: Pain s now a 4 at worst when moving boxes etc.     Time  12    Period  Weeks    Status  On-going    Target Date  05/01/18              Patient will benefit from skilled therapeutic intervention in order to improve the following deficits and impairments:     Visit Diagnosis: Other muscle spasm  Chronic midline low back pain without sciatica  Constipation by outlet dysfunction  Diastasis  recti     Problem List Patient Active Problem List   Diagnosis Date Noted  . Common bile duct stone   . Abnormal liver function tests   .  Abnormal magnetic resonance imaging of liver   . Transaminitis 11/01/2017  . Third degree laceration of perineum during delivery, postpartum 10/06/2017  . Gestational hypertension 10/03/2017  . Abdominal pain affecting pregnancy 06/28/2017  . 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 Presence of subdermal contraceptive device 04/07/2014  . Sleep disturbance 11/03/2013  . Adjustment disorder with mixed anxiety and depressed mood 08/12/2013   Cleophus Molt DPT, ATC Cleophus Molt 04/28/2018, 10:38 PM  [place] [facility] MAIN [facility] SERVICES [address] [place], [name], [address] Phone: [phone]   Fax:  [phone]  Name: LUJUANA KAPLER MRN: [id number] Date of Birth: 03-10-1996

## 2018-05-08 ENCOUNTER — Ambulatory Visit: Payer: BLUE CROSS/BLUE SHIELD

## 2018-05-08 DIAGNOSIS — G8929 Other chronic pain: Secondary | ICD-10-CM

## 2018-05-08 DIAGNOSIS — M6208 Separation of muscle (nontraumatic), other site: Secondary | ICD-10-CM

## 2018-05-08 DIAGNOSIS — M62838 Other muscle spasm: Secondary | ICD-10-CM | POA: Diagnosis not present

## 2018-05-08 DIAGNOSIS — K5902 Outlet dysfunction constipation: Secondary | ICD-10-CM

## 2018-05-08 DIAGNOSIS — M545 Low back pain, unspecified: Secondary | ICD-10-CM

## 2018-05-08 NOTE — Therapy (Addendum)
[place] MAIN [facility] SERVICES [address] [place], [place], [address] Phone: [phone]   Fax:  [phone]  Physical Therapy Treatment  Patient Details  Name: Hannah Hancock MRN: [id number] Date of Birth: July 19, 1996 Referring Provider (PT): Lisette Grinder   Encounter Date: 05/08/2018    Past Medical History:  Diagnosis Date  . Abdominal pain, recurrent   . 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 Anxiety   . Asthma    no meds, no recent attacks  . Gallstones   . Tendinitis    R hip  . Urinary tract infection    Last one 4 months ago  . Weight loss     Past Surgical History:  Procedure Laterality Date  . ADENOIDECTOMY    . CHOLECYSTECTOMY N/A 11/03/2017   Procedure: LAPAROSCOPIC CHOLECYSTECTOMY;  Surgeon: Herbert Pun, MD;  Location: [facility] ORS;  Service: General;  Laterality: N/A;  . ENDOSCOPIC RETROGRADE CHOLANGIOPANCREATOGRAPHY (ERCP) WITH PROPOFOL N/A 11/02/2017   Procedure: ENDOSCOPIC RETROGRADE CHOLANGIOPANCREATOGRAPHY (ERCP) WITH PROPOFOL;  Surgeon: Lucilla Lame, MD;  Location: [facility] ENDOSCOPY;  Service: Endoscopy;  Laterality: N/A;  . ESOPHAGOGASTRODUODENOSCOPY  10/20/2011   Procedure: ESOPHAGOGASTRODUODENOSCOPY (EGD);  Surgeon: [name], MD;  Location: [place];  Service: Gastroenterology;  Laterality: N/A;    There were no vitals filed for this visit.    Pelvic Floor Physical Therapy Treatment Note  SCREENING  Changes in medications, allergies, or medical history?: Had to go to the emergency room last week for severe headache/migraine and fainting episode.    SUBJECTIVE  Patient reports: Had a major Migraine and fainting episode last week, has not done any this week due to fatigue but was doing well last week. Is still having constipation but is working on increasing water intake, is now at ~ 30-60oz of water per day and one glass of coffee in the morning, one glass of tea with dinner. Has  minor leakage with standing after using the bathroom still occasionally.  Precautions:  Breastfeeding.  Pain update:  No pain  Patient Goals: Decrease Urinary leakage, back pain,a and improve relaxation for intercourse.   OBJECTIVE  Changes in: Posture/Observations:  Pelvis is level  Pelvic floor: Less tenderness through posterior fourchette compared to prior treatment but continues to have pain with pressure through scar tissue at 4&7 o'clock   Palpation: No TTP through low back or buttocks. No pain with mobs to low back or sacrum.  INTERVENTIONS THIS SESSION: Manual: re-assessed back and buttocks for spasm or restriction and found no return of spasm. Performed TP release to B posterior PR/PC and worked on scar tissue through the posterior fourchette to improve scar mobility and decrease leakage and pain with intercourse. Self-care: Educated on exhale to stand to improve timing of the PFM and decrease leakage. Educated on the importance of increasing water intake to decrease constipation further and how to also use increased water intake and decreased portion sizes to help her return to pre-pregnancy weight and maintain adequate milk production for her daughter.  Total time: 60 min.                            PT Short Term Goals - 05/24/18 1204      PT SHORT TERM GOAL #1   Title  Patient will demonstrate a coordinated contraction, relaxation, and bulge of the pelvic floor muscles to demonstrate functional recruitment and motion and allow for further strengthening.    Time  6    Period  Weeks    Status  Achieved    Target Date  01/10/18      PT SHORT TERM GOAL #2   Title  Patient will demonstrate improved pelvic alignment and balance of musculature surrounding the pelvis to facilitate decreased PFM spasms and decrease pelvic pain.    Time  3    Period  Weeks    Target Date  12/20/17      PT SHORT TERM GOAL #3   Title  Patient will report  consistent use of foot-stool (squatty-potty) for positioning with BM to decrease pain with BM and intra-abdominal pressure.    Baseline  Pt. did not remember education on stool so it was reviewed this session. Patient has decided not to buy stool due to expense and improvement of BM ease.    Time  3    Period  Weeks    Status  Not Met    Target Date  03/27/18      PT SHORT TERM GOAL #4   Title  Patient will demonstrate HEP x1 in the clinic to demonstrate understanding and proper form to allow for further improvement.    Time  3    Period  Weeks    Status  Achieved        PT Long Term Goals - 05/22/18 1446      PT LONG TERM GOAL #1   Title  Patient will report no episodes of SUI over the course of the prior two weeks to demonstrate improved functional ability.    Time  12    Period  Weeks    Status  Achieved    Target Date  05/01/18      PT LONG TERM GOAL #2   Title  Patient will score at or below 2/300 on the PFDI and 0/300 on the PFIQ to demonstrate a clinically meaningful decrease in disability and distress due to pelvic floor dysfunction.    Baseline  PFDI:47/300, PFIQ: 22/300, as of 02/20/18: PFDI: 31/300, PFIQ: 24/300, As of 05/22/2018: PFDI: 6/300, PFIQ: 5/300.    Time  12    Period  Weeks    Status  On-going    Target Date  07/17/18      PT LONG TERM GOAL #3   Title  Patient will report having BM's at least every-other day with consistency between [facility] stool scale 3-5 over the prior week to demonstrate decreased constipation.    Time  12    Period  Weeks    Status  Achieved    Target Date  05/01/18      PT LONG TERM GOAL #4   Title  Patient will describe ability to have intercourse without vaginismus/penetration difficulty to demonstrate improved PFM relaxation and improved QOL.    Baseline  Has not tried intercourse but continues to have pain with pressure to posterior fourchette.    Time  12    Period  Weeks    Status  On-going    Target Date  07/17/18      PT  LONG TERM GOAL #5   Title  Patient will be able to stand or walf for greater than 2 hours without an increase in LBP greater than 2/10 to demonstrate improved quality of life and functional ability.    Baseline  1 hour of standing or walking increases pain to highest level (5/10) As of 02/20/18: Pain s now a 4 at worst when moving boxes etc.  Time  12    Status  Achieved    Target Date  05/01/18              Patient will benefit from skilled therapeutic intervention in order to improve the following deficits and impairments:  Decreased skin integrity, Increased fascial restricitons, Impaired sensation, Improper body mechanics, Pain, Decreased coordination, Decreased scar mobility, Increased muscle spasms, Impaired tone, Postural dysfunction, Decreased activity tolerance, Decreased range of motion, Decreased strength, Decreased balance, Difficulty walking  Visit Diagnosis: Other muscle spasm  Chronic midline low back pain without sciatica  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 Common bile duct stone   . Abnormal liver function tests   . Abnormal magnetic resonance imaging of liver   . Transaminitis 11/01/2017  . Third degree laceration of perineum during delivery, postpartum 10/06/2017  . Gestational hypertension 10/03/2017  . Abdominal pain affecting pregnancy 06/28/2017  . 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 Presence of subdermal contraceptive device 04/07/2014  . Sleep disturbance 11/03/2013  . Adjustment disorder with mixed anxiety and depressed mood 08/12/2013   Willa Rough DPT, ATC Willa Rough 05/24/2018, 12:10 PM  [place] MAIN [facility] SERVICES [address] [place], [place], [address] Phone: [phone]   Fax:  [phone]  Name: Hannah Hancock MRN: [id number] Date of Birth: [birth year]

## 2018-05-15 ENCOUNTER — Ambulatory Visit: Payer: BLUE CROSS/BLUE SHIELD

## 2018-05-15 DIAGNOSIS — M62838 Other muscle spasm: Secondary | ICD-10-CM

## 2018-05-15 DIAGNOSIS — K5902 Outlet dysfunction constipation: Secondary | ICD-10-CM

## 2018-05-15 DIAGNOSIS — M6208 Separation of muscle (nontraumatic), other site: Secondary | ICD-10-CM

## 2018-05-15 DIAGNOSIS — G8929 Other chronic pain: Secondary | ICD-10-CM

## 2018-05-15 DIAGNOSIS — M545 Low back pain: Secondary | ICD-10-CM

## 2018-05-15 NOTE — Patient Instructions (Addendum)
Mother roasting  (wrapping)  Baby Bod book.  Kegel exercises:    With neutral spine, tighten pelvic floor by imagining you are stopping the flow of urine, squeezing only around the vagina and anus.  Quick-Flicks: Pull up and in quickly and hold for one second then relax and then try to "bulge" or "extra-relax for 2 seconds. Repeat this for 2 minutes. Do this exercise _3__ times throughout the day.     Vaginismus Therapy Dilator

## 2018-05-15 NOTE — Therapy (Signed)
[facility] MAIN [facility] SERVICES [address] [place], [name], [address] Phone: [phone]   Fax:  [phone]  Physical Therapy Treatment  Patient Details  Name: Hannah Hancock MRN: [id number] Date of Birth: May 26, 1996 Referring Provider: Genia Del   Encounter Date: 05/15/2018  PT End of Session - 05/15/18 1802    Visit Number  17    Number of Visits  22    Date for PT Re-Evaluation  05/26/18    Authorization Type  Medicaid    Authorization Time Period  Through 10-6    Authorization - Visit Number  7    Authorization - Number of Visits  10    PT Start Time  1430    PT Stop Time  1530    PT Time Calculation (min)  60 min    Activity Tolerance  Patient tolerated treatment well    Behavior During Therapy  [facility] for tasks assessed/performed       Past Medical History:  Diagnosis Date  . Abdominal pain, recurrent   . 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 Anxiety   . Asthma    no meds, no recent attacks  . Gallstones   . Tendinitis    R hip  . Urinary tract infection    Last one 4 months ago  . Weight loss     Past Surgical History:  Procedure Laterality Date  . ADENOIDECTOMY    . CHOLECYSTECTOMY N/A 11/03/2017   Procedure: LAPAROSCOPIC CHOLECYSTECTOMY;  Surgeon: Carolan Shiver, MD;  Location: [facility] ORS;  Service: General;  Laterality: N/A;  . ENDOSCOPIC RETROGRADE CHOLANGIOPANCREATOGRAPHY (ERCP) WITH PROPOFOL N/A 11/02/2017   Procedure: ENDOSCOPIC RETROGRADE CHOLANGIOPANCREATOGRAPHY (ERCP) WITH PROPOFOL;  Surgeon: Midge Minium, MD;  Location: [facility] ENDOSCOPY;  Service: Endoscopy;  Laterality: N/A;  . ESOPHAGOGASTRODUODENOSCOPY  10/20/2011   Procedure: ESOPHAGOGASTRODUODENOSCOPY (EGD);  Surgeon: Jon Gills, MD;  Location: [facility] OR;  Service: Gastroenterology;  Laterality: N/A;    There were no vitals filed for this visit.    Pelvic Floor Physical Therapy Treatment Note  SCREENING  Changes in  medications, allergies, or medical history?: no    SUBJECTIVE  Patient reports: Is doing ok, is not having pain at rest but still having pain with self posterior fourchette massage  Precautions:  Breastfeeding  Patient Goals: Decrease Urinary leakage, back pain,a and improve relaxation for intercourse.   OBJECTIVE  Changes in:  Pelvic floor: Pt. Responded best to down-training with the use of a contraction first, has difficulty with visualization, moderate success with breathing.  INTERVENTIONS THIS SESSION: NM re-ed: worked on visualization for relaxation and deep breathing to decrease resting tension of the PFM for decreased spasm. Used Biofeedback to help visualize contraction, relaxation, and bulge for improved control over PFM and to allow down-training at home with use of lengthening kegels. Self-care: educated on the use of dilators for further stretching of scar tissue and relief of PFM tightness after D/C.  Total time: 60 min.                         PT Education - 05/15/18 1801    Education provided  Yes    Education Details  See Pt. Instructions and Interventions this session.    Person(s) Educated  Patient    Methods  Explanation;Verbal cues;Other (comment)   biofeedback   Comprehension  Verbalized understanding;Returned demonstration;Verbal cues required       PT Short Term Goals - 02/20/18 1505  PT SHORT TERM GOAL #1   Title  Patient will demonstrate a coordinated contraction, relaxation, and bulge of the pelvic floor muscles to demonstrate functional recruitment and motion and allow for further strengthening.    Time  6    Period  Weeks    Status  Achieved    Target Date  01/10/18      PT SHORT TERM GOAL #2   Title  Patient will demonstrate improved pelvic alignment and balance of musculature surrounding the pelvis to facilitate decreased PFM spasms and decrease pelvic pain.    Time  3    Period  Weeks    Status  Achieved     Target Date  12/20/17      PT SHORT TERM GOAL #3   Title  Patient will report consistent use of foot-stool (squatty-potty) for positioning with BM to decrease pain with BM and intra-abdominal pressure.    Baseline  Pt. did not remember education on stool so it was reviewed this session.    Time  3    Period  Weeks    Status  On-going    Target Date  03/27/18      PT SHORT TERM GOAL #4   Title  Patient will demonstrate HEP x1 in the clinic to demonstrate understanding and proper form to allow for further improvement.    Time  3    Period  Weeks    Status  Achieved    Target Date  12/20/17        PT Long Term Goals - 02/20/18 1347      PT LONG TERM GOAL #1   Title  Patient will report no episodes of SUI over the course of the prior two weeks to demonstrate improved functional ability.    Time  12    Period  Weeks    Status  Achieved    Target Date  05/01/18      PT LONG TERM GOAL #2   Title  Patient will score at or below 2/300 on the PFDI and 0/300 on the PFIQ to demonstrate a clinically meaningful decrease in disability and distress due to pelvic floor dysfunction.    Baseline  PFDI:47/300, PFIQ: 22/300, as of 02/20/18: PFDI: 31/300, PFIQ: 24/300    Time  12    Period  Weeks    Status  On-going    Target Date  05/01/18      PT LONG TERM GOAL #3   Title  Patient will report having BM's at least every-other day with consistency between [facility] stool scale 3-5 over the prior week to demonstrate decreased constipation.    Time  12    Period  Weeks    Status  On-going    Target Date  05/01/18      PT LONG TERM GOAL #4   Title  Patient will describe ability to have intercourse without vaginismus/penetration difficulty to demonstrate improved PFM relaxation and improved QOL.    Baseline  Decreased pain at the introitus but pain with deep thrusting.     Time  12    Period  Weeks    Status  On-going    Target Date  05/01/18      PT LONG TERM GOAL #5   Title  Patient will  be able to stand or walf for greater than 2 hours without an increase in LBP greater than 2/10 to demonstrate improved quality of life and functional ability.    Baseline  1  hour of standing or walking increases pain to highest level (5/10) As of 02/20/18: Pain s now a 4 at worst when moving boxes etc.     Time  12    Period  Weeks    Status  On-going    Target Date  05/01/18            Plan - 05/15/18 1803    Clinical Impression Statement  Pt. responded well to all interventions today, demonstrating improved ability to relax and engage her PFM with the use of the biofeedback and cueing and understanding of all education provided. Continue per POC.    Clinical Presentation  Stable    Clinical Decision Making  Moderate    Rehab Potential  Good    Clinical Impairments Affecting Rehab Potential  Grade 3 tearing with delivery, diastasis recti, recent abdominal surgery    PT Frequency  1x / week    PT Duration  Other (comment)    PT Treatment/Interventions  Biofeedback;Electrical Stimulation;Traction;Moist Heat;Functional mobility training;Therapeutic activities;Therapeutic exercise;Patient/family education;Neuromuscular re-education;Manual techniques;Scar mobilization;Energy conservation;Dry needling;Passive range of motion    PT Next Visit Plan  bio-feedback for down-regulation? consider rectal work for scar. give information on how to progress Dilators for further home treatment  D/C?    PT Home Exercise Plan  Child's pose, diastasis approximation, 3-way wall stretch and pelvic tilts in seated, piriformis stretch, side-stretch, TA in quadruped, bird-dog, [id number], lengthening kegels      Consulted and Agree with Plan of Care  Patient       Patient will benefit from skilled therapeutic intervention in order to improve the following deficits and impairments:  Decreased skin integrity, Increased fascial restricitons, Impaired sensation, Improper body mechanics, Pain, Decreased coordination,  Decreased scar mobility, Increased muscle spasms, Impaired tone, Postural dysfunction, Decreased activity tolerance, Decreased range of motion, Decreased strength, Decreased balance, Difficulty walking  Visit Diagnosis: Other muscle spasm  Chronic midline low back pain without sciatica  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 Common bile duct stone   . Abnormal liver function tests   . Abnormal magnetic resonance imaging of liver   . Transaminitis 11/01/2017  . Third degree laceration of perineum during delivery, postpartum 10/06/2017  . Gestational hypertension 10/03/2017  . Abdominal pain affecting pregnancy 06/28/2017  . 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 Presence of subdermal contraceptive device 04/07/2014  . Sleep disturbance 11/03/2013  . Adjustment disorder with mixed anxiety and depressed mood 08/12/2013   Cleophus Molt DPT, ATC Cleophus Molt 05/15/2018, 6:07 PM  [facility] MAIN [facility] SERVICES [address] [place], [name], [address] Phone: [phone]   Fax:  [phone]  Name: Hannah Hancock MRN: [id number] Date of Birth: [birth year]

## 2018-05-22 ENCOUNTER — Ambulatory Visit: Payer: BLUE CROSS/BLUE SHIELD | Attending: Certified Nurse Midwife

## 2018-05-22 DIAGNOSIS — M62838 Other muscle spasm: Secondary | ICD-10-CM | POA: Diagnosis not present

## 2018-05-22 DIAGNOSIS — G8929 Other chronic pain: Secondary | ICD-10-CM | POA: Diagnosis present

## 2018-05-22 DIAGNOSIS — K5902 Outlet dysfunction constipation: Secondary | ICD-10-CM | POA: Insufficient documentation

## 2018-05-22 DIAGNOSIS — M6208 Separation of muscle (nontraumatic), other site: Secondary | ICD-10-CM | POA: Diagnosis present

## 2018-05-22 DIAGNOSIS — M545 Low back pain: Secondary | ICD-10-CM | POA: Diagnosis present

## 2018-05-22 NOTE — Therapy (Addendum)
[facility] MAIN REHAB SERVICES [address] [place], [place], [address] Phone: [phone]   Fax:  [phone]  Physical Therapy Treatment  Patient Details  Name: Hannah Hancock MRN: [id number] Date of Birth: 09/09/1995 Referring Provider (PT): Haviland, Margaret   Encounter Date: 05/22/2018    Past Medical History:  Diagnosis Date  . Abdominal pain, recurrent   . 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 Anxiety   . Asthma    no meds, no recent attacks  . Gallstones   . Tendinitis    R hip  . Urinary tract infection    Last one 4 months ago  . Weight loss     Past Surgical History:  Procedure Laterality Date  . ADENOIDECTOMY    . CHOLECYSTECTOMY N/A 11/03/2017   Procedure: LAPAROSCOPIC CHOLECYSTECTOMY;  Surgeon: Cintron-Diaz, Edgardo, MD;  Location: [facility] ORS;  Service: General;  Laterality: N/A;  . ENDOSCOPIC RETROGRADE CHOLANGIOPANCREATOGRAPHY (ERCP) WITH PROPOFOL N/A 11/02/2017   Procedure: ENDOSCOPIC RETROGRADE CHOLANGIOPANCREATOGRAPHY (ERCP) WITH PROPOFOL;  Surgeon: Wohl, Darren, MD;  Location: [facility] ENDOSCOPY;  Service: Endoscopy;  Laterality: N/A;  . ESOPHAGOGASTRODUODENOSCOPY  10/20/2011   Procedure: ESOPHAGOGASTRODUODENOSCOPY (EGD);  Surgeon: Joseph H. Clark, MD;  Location: [facility] OR;  Service: Gastroenterology;  Laterality: N/A;    There were no vitals filed for this visit.    Pelvic Floor Physical Therapy Treatment Note  SCREENING  Changes in medications, allergies, or medical history?: no    SUBJECTIVE  Patient reports: She has figured out why she was having a little bit of leakage following urinating because she was continuing to press down and out as she was standing after urinating because of advice she had received from her urologist after frequent UTI's in the past. She is not having leakage now since she made an adjustment for this.  Precautions:  breastfeeding  Pain update: Only has pain with  pressure near scar tissue.  Patient Goals: Decrease Urinary leakage, back pain,a and improve relaxation for intercourse.   OBJECTIVE  Changes in: Posture/Observations:  Minor anterior pelvic tilt and forward shoulders, much less than originally noted.  Pelvic floor: Continued Pain at posterior fourchette but is not having much return of deeper spasms and is able to tolerate greater pressure to perineum. Decreased mobility of perineal fascial mobility  INTERVENTIONS THIS SESSION: Manual: Performed MFR to perianal region with mobilization of pelvis and femur to improve motion and decrease tension on the perineal nerve bundle for decreased sensitivity.    Total time: 45 min.                           PT Short Term Goals - 05/24/18 1204      PT SHORT TERM GOAL #1   Title  Patient will demonstrate a coordinated contraction, relaxation, and bulge of the pelvic floor muscles to demonstrate functional recruitment and motion and allow for further strengthening.    Time  6    Period  Weeks    Status  Achieved    Target Date  01/10/18      PT SHORT TERM GOAL #2   Title  Patient will demonstrate improved pelvic alignment and balance of musculature surrounding the pelvis to facilitate decreased PFM spasms and decrease pelvic pain.    Time  3    Period  Weeks    Target Date  12/20/17      PT SHORT TERM GOAL #3   Title  Patient will   report consistent use of foot-stool (squatty-potty) for positioning with BM to decrease pain with BM and intra-abdominal pressure.    Baseline  Pt. did not remember education on stool so it was reviewed this session. Patient has decided not to buy stool due to expense and improvement of BM ease.    Time  3    Period  Weeks    Status  Not Met    Target Date  03/27/18      PT SHORT TERM GOAL #4   Title  Patient will demonstrate HEP x1 in the clinic to demonstrate understanding and proper form to allow for further improvement.    Time   3    Period  Weeks    Status  Achieved        PT Long Term Goals - 05/22/18 1446      PT LONG TERM GOAL #1   Title  Patient will report no episodes of SUI over the course of the prior two weeks to demonstrate improved functional ability.    Time  12    Period  Weeks    Status  Achieved    Target Date  05/01/18      PT LONG TERM GOAL #2   Title  Patient will score at or below 2/300 on the PFDI and 0/300 on the PFIQ to demonstrate a clinically meaningful decrease in disability and distress due to pelvic floor dysfunction.    Baseline  PFDI:47/300, PFIQ: 22/300, as of 02/20/18: PFDI: 31/300, PFIQ: 24/300, As of 05/22/2018: PFDI: 6/300, PFIQ: 5/300.    Time  12    Period  Weeks    Status  On-going    Target Date  07/17/18      PT LONG TERM GOAL #3   Title  Patient will report having BM's at least every-other day with consistency between [facility] stool scale 3-5 over the prior week to demonstrate decreased constipation.    Time  12    Period  Weeks    Status  Achieved    Target Date  05/01/18      PT LONG TERM GOAL #4   Title  Patient will describe ability to have intercourse without vaginismus/penetration difficulty to demonstrate improved PFM relaxation and improved QOL.    Baseline  Has not tried intercourse but continues to have pain with pressure to posterior fourchette.    Time  12    Period  Weeks    Status  On-going    Target Date  07/17/18      PT LONG TERM GOAL #5   Title  Patient will be able to stand or walf for greater than 2 hours without an increase in LBP greater than 2/10 to demonstrate improved quality of life and functional ability.    Baseline  1 hour of standing or walking increases pain to highest level (5/10) As of 02/20/18: Pain s now a 4 at worst when moving boxes etc.     Time  12    Status  Achieved    Target Date  05/01/18            Plan - 05/24/18 1431    Clinical Impression Statement  Pt. has made improvements in water intake following prior  discussion and is now having improved BM frequency and decreased constipation. She has also not been having UI following improved understanding of how she is using her PFM after urination. SHe continues to have perenial pain but responded well to [facility]  today, demonstrating decreased sensitivity and improved fascial mobility following session. We will require 8 more visits to continue to improve fascial mobility and perenial scar length to allow for no pain with vaginal penetration but patient has met all other goals and is demonstrating improved adherence to HEP and reccomendations.     Clinical Presentation  Stable    Clinical Decision Making  Moderate    Rehab Potential  Good    Clinical Impairments Affecting Rehab Potential  Grade 3 tearing with delivery, diastasis recti, recent abdominal surgery    PT Frequency  1x / week    PT Duration  8 weeks    PT Treatment/Interventions  Biofeedback;Electrical Stimulation;Traction;Moist Heat;Functional mobility training;Therapeutic activities;Therapeutic exercise;Patient/family education;Neuromuscular re-education;Manual techniques;Scar mobilization;Energy conservation;Dry needling;Passive range of motion    PT Next Visit Plan  Further fascial mobility and side-lying MWM around sacral apex,  consider rectal work for scar. give information on how to progress Dilators for further home treatment  D/C?    PT Home Exercise Plan  Child's pose, diastasis approximation, 3-way wall stretch and pelvic tilts in seated, piriformis stretch, side-stretch, TA in quadruped, bird-dog, [id number], lengthening kegels      Consulted and Agree with Plan of Care  Patient       Patient will benefit from skilled therapeutic intervention in order to improve the following deficits and impairments:  Decreased skin integrity, Increased fascial restricitons, Impaired sensation, Improper body mechanics, Pain, Decreased coordination, Decreased scar mobility, Increased muscle spasms, Impaired  tone, Postural dysfunction, Decreased activity tolerance, Decreased range of motion, Decreased strength, Decreased balance, Difficulty walking  Visit Diagnosis: Other muscle spasm  Chronic midline low back pain without sciatica  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 Common bile duct stone   . Abnormal liver function tests   . Abnormal magnetic resonance imaging of liver   . Transaminitis 11/01/2017  . Third degree laceration of perineum during delivery, postpartum 10/06/2017  . Gestational hypertension 10/03/2017  . Abdominal pain affecting pregnancy 06/28/2017  . 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 Presence of subdermal contraceptive device 04/07/2014  . Sleep disturbance 11/03/2013  . Adjustment disorder with mixed anxiety and depressed mood 08/12/2013   Keeli T. Gailes DPT, ATC Keeli T Gailes 05/24/2018, 2:31 PM  [facility] MAIN REHAB SERVICES [address] [place], [place], [address] Phone: [phone]   Fax:  [phone]  Name: Javiana D Matsushita MRN: [id number] Date of Birth: 03/11/1996   

## 2018-05-24 NOTE — Addendum Note (Signed)
Addended by: Flora Lipps T on: 05/24/2018 12:13 PM   Modules accepted: Orders

## 2018-06-10 ENCOUNTER — Ambulatory Visit: Payer: BLUE CROSS/BLUE SHIELD

## 2018-06-26 ENCOUNTER — Ambulatory Visit: Payer: BLUE CROSS/BLUE SHIELD | Attending: Certified Nurse Midwife

## 2018-06-26 DIAGNOSIS — G8929 Other chronic pain: Secondary | ICD-10-CM | POA: Diagnosis present

## 2018-06-26 DIAGNOSIS — M545 Low back pain: Secondary | ICD-10-CM | POA: Insufficient documentation

## 2018-06-26 DIAGNOSIS — M6208 Separation of muscle (nontraumatic), other site: Secondary | ICD-10-CM | POA: Diagnosis present

## 2018-06-26 DIAGNOSIS — M62838 Other muscle spasm: Secondary | ICD-10-CM | POA: Diagnosis not present

## 2018-06-26 DIAGNOSIS — K5902 Outlet dysfunction constipation: Secondary | ICD-10-CM | POA: Insufficient documentation

## 2018-06-26 NOTE — Therapy (Signed)
[place] MAIN [facility] SERVICES [address] [place], [place], [address] Phone: [phone]   Fax:  [phone]  Physical Therapy Treatment  Patient Details  Name: Hannah Hancock MRN: [id number] Date of Birth: [birth year] Referring Provider (PT): Lisette Grinder   Encounter Date: 06/26/2018  PT End of Session - 06/26/18 1607    Visit Number  18    Number of Visits  25    Date for PT Re-Evaluation  08/14/18    Authorization Type  Medicaid    Authorization - Visit Number  1    Authorization - Number of Visits  8    PT Start Time  1330    PT Stop Time  1430    PT Time Calculation (min)  60 min    Activity Tolerance  Patient tolerated treatment well    Behavior During Therapy  [facility] for tasks assessed/performed       Past Medical History:  Diagnosis Date  . Abdominal pain, recurrent   . 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 Anxiety   . Asthma    no meds, no recent attacks  . Gallstones   . Tendinitis    R hip  . Urinary tract infection    Last one 4 months ago  . Weight loss     Past Surgical History:  Procedure Laterality Date  . ADENOIDECTOMY    . CHOLECYSTECTOMY N/A 11/03/2017   Procedure: LAPAROSCOPIC CHOLECYSTECTOMY;  Surgeon: Herbert Pun, MD;  Location: [facility] ORS;  Service: General;  Laterality: N/A;  . ENDOSCOPIC RETROGRADE CHOLANGIOPANCREATOGRAPHY (ERCP) WITH PROPOFOL N/A 11/02/2017   Procedure: ENDOSCOPIC RETROGRADE CHOLANGIOPANCREATOGRAPHY (ERCP) WITH PROPOFOL;  Surgeon: Lucilla Lame, MD;  Location: [facility] ENDOSCOPY;  Service: Endoscopy;  Laterality: N/A;  . ESOPHAGOGASTRODUODENOSCOPY  10/20/2011   Procedure: ESOPHAGOGASTRODUODENOSCOPY (EGD);  Surgeon: [name], MD;  Location: [place];  Service: Gastroenterology;  Laterality: N/A;    There were no vitals filed for this visit.    Pelvic Floor Physical Therapy Treatment Note  SCREENING  Changes in medications, allergies, or medical history?:  no    SUBJECTIVE  Patient reports: Has tried intercourse once and had pain initially but it decreased as it went. She has a small sharp pain that persisted up to a day afterward. Has started having leakage when she stands up from urinating again, it is only over the last few days. Back started hurting again about a week ago. Period started started yesterday 11/5   Precautions:  Breastfeeding  Pain update:  Location of pain: low back Current pain:  3/10  Max pain:  4/10 Least pain:  1/10 Nature of pain: achy  Patient Goals: Decrease Urinary leakage, back pain,a and improve relaxation for intercourse.   OBJECTIVE  Changes in: Posture/Observations:  R ASIS appears slightly low and is tender to palpation.  Palpation: TTP to L lumbar paraspinals, Multifidus, and QL  INTERVENTIONS THIS SESSION: Therex: reviewed bird-dog and mini-marches as well as side-stretch to help Pt. prioritize strengthening exercises to prevent return of LB spasms and pain as well as hopefully decrease leakage with standing. Manual: performed TP release to L lumbar paraspinals, Multifidus, and QL to decrease pain and spasm and allow for decreased tension on the lumbar nerve roots for improved recruitment and relaxation of the PFM. Dry-needle:  Performed standard approach TPDN to L lumbar paraspinals, Multifidus, and QL to decrease pain and spasm and allow for decreased tension on the lumbar nerve roots for improved recruitment and relaxation of the  PFM. Self-care: reviewed and reinforced the importance of good body mechanics to prevent return of spasms and symptoms. Recommended Pt. Use a wrap at home to help her when holding her 67 month-old who is getting bigger and has been wanting to be help more so she does not lean back and cause increased spasms.   Total time: 60 min.                     Trigger Point Dry Needling - 06/26/18 1552    Consent Given?  Yes    Education Handout Provided   No    Muscles Treated Upper Body  Quadratus Lumborum;Longissimus    Longissimus Response  Twitch response elicited;Palpable increased muscle length           PT Education - 06/26/18 1607    Education provided  Yes    Education Details  See Interventions this session    Person(s) Educated  Patient    Methods  Explanation;Verbal cues    Comprehension  Verbalized understanding;Verbal cues required       PT Short Term Goals - 05/24/18 1204      PT SHORT TERM GOAL #1   Title  Patient will demonstrate a coordinated contraction, relaxation, and bulge of the pelvic floor muscles to demonstrate functional recruitment and motion and allow for further strengthening.    Time  6    Period  Weeks    Status  Achieved    Target Date  01/10/18      PT SHORT TERM GOAL #2   Title  Patient will demonstrate improved pelvic alignment and balance of musculature surrounding the pelvis to facilitate decreased PFM spasms and decrease pelvic pain.    Time  3    Period  Weeks    Target Date  12/20/17      PT SHORT TERM GOAL #3   Title  Patient will report consistent use of foot-stool (squatty-potty) for positioning with BM to decrease pain with BM and intra-abdominal pressure.    Baseline  Pt. did not remember education on stool so it was reviewed this session. Patient has decided not to buy stool due to expense and improvement of BM ease.    Time  3    Period  Weeks    Status  Not Met    Target Date  03/27/18      PT SHORT TERM GOAL #4   Title  Patient will demonstrate HEP x1 in the clinic to demonstrate understanding and proper form to allow for further improvement.    Time  3    Period  Weeks    Status  Achieved        PT Long Term Goals - 05/22/18 1446      PT LONG TERM GOAL #1   Title  Patient will report no episodes of SUI over the course of the prior two weeks to demonstrate improved functional ability.    Time  12    Period  Weeks    Status  Achieved    Target Date  05/01/18       PT LONG TERM GOAL #2   Title  Patient will score at or below 2/300 on the PFDI and 0/300 on the PFIQ to demonstrate a clinically meaningful decrease in disability and distress due to pelvic floor dysfunction.    Baseline  PFDI:47/300, PFIQ: 22/300, as of 02/20/18: PFDI: 31/300, PFIQ: 24/300, As of 05/22/2018: PFDI: 6/300, PFIQ: 5/300.  Time  12    Period  Weeks    Status  On-going    Target Date  07/17/18      PT LONG TERM GOAL #3   Title  Patient will report having BM's at least every-other day with consistency between [facility] stool scale 3-5 over the prior week to demonstrate decreased constipation.    Time  12    Period  Weeks    Status  Achieved    Target Date  05/01/18      PT LONG TERM GOAL #4   Title  Patient will describe ability to have intercourse without vaginismus/penetration difficulty to demonstrate improved PFM relaxation and improved QOL.    Baseline  Has not tried intercourse but continues to have pain with pressure to posterior fourchette.    Time  12    Period  Weeks    Status  On-going    Target Date  07/17/18      PT LONG TERM GOAL #5   Title  Patient will be able to stand or walf for greater than 2 hours without an increase in LBP greater than 2/10 to demonstrate improved quality of life and functional ability.    Baseline  1 hour of standing or walking increases pain to highest level (5/10) As of 02/20/18: Pain s now a 4 at worst when moving boxes etc.     Time  12    Status  Achieved    Target Date  05/01/18            Plan - 06/26/18 1608    Clinical Impression Statement  Pt. has had an increase in back pain and UI since her last visit due to holding her daughter more and her getting bigger. She responded well to education today on the importance of her continuing to do her strengthening exercises and to use proper body mechanics to prevent these spasms and symptoms. She also responded well to manual treatment and dry neeling to the L low back to  decrease spasms and her pain reduced from 3/10 to 0/10 following treatment. Continue per POC focusing on internal and external fascial and scar mobility to continue to decrease pain with intercourse and UI.    Clinical Presentation  Stable    Clinical Decision Making  Moderate    Rehab Potential  Good    Clinical Impairments Affecting Rehab Potential  Grade 3 tearing with delivery, diastasis recti, recent abdominal surgery    PT Frequency  1x / week    PT Duration  8 weeks    PT Treatment/Interventions  Biofeedback;Electrical Stimulation;Traction;Moist Heat;Functional mobility training;Therapeutic activities;Therapeutic exercise;Patient/family education;Neuromuscular re-education;Manual techniques;Scar mobilization;Energy conservation;Dry needling;Passive range of motion    PT Next Visit Plan  Further fascial mobility around perenium using MWM, check Pubic symphysis alignment, consider rectal work for scar. give information on how to progress Dilators for further home treatment?    PT Home Exercise Plan  Child's pose, diastasis approximation, 3-way wall stretch and pelvic tilts in seated, piriformis stretch, side-stretch, TA in quadruped, bird-dog, [id number], lengthening kegels      Consulted and Agree with Plan of Care  Patient       Patient will benefit from skilled therapeutic intervention in order to improve the following deficits and impairments:  Decreased skin integrity, Increased fascial restricitons, Impaired sensation, Improper body mechanics, Pain, Decreased coordination, Decreased scar mobility, Increased muscle spasms, Impaired tone, Postural dysfunction, Decreased activity tolerance, Decreased range of motion, Decreased strength, Decreased balance, Difficulty walking  Visit Diagnosis: Other muscle spasm  Chronic midline low back pain without sciatica  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  Common bile duct stone   . Abnormal liver function tests   . Abnormal magnetic resonance imaging of liver   . Transaminitis 11/01/2017  . Third degree laceration of perineum during delivery, postpartum 10/06/2017  . Gestational hypertension 10/03/2017  . Abdominal pain affecting pregnancy 06/28/2017  . 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 Presence of subdermal contraceptive device 04/07/2014  . Sleep disturbance 11/03/2013  . Adjustment disorder with mixed anxiety and depressed mood 08/12/2013   Willa Rough DPT, ATC Willa Rough 06/26/2018, 4:21 PM  [place] MAIN [facility] SERVICES [address] [place], [place], [address] Phone: [phone]   Fax:  [phone]  Name: Hannah Hancock MRN: [id number] Date of Birth: 12/03/1995

## 2018-07-03 ENCOUNTER — Ambulatory Visit: Payer: BLUE CROSS/BLUE SHIELD

## 2018-07-03 DIAGNOSIS — M62838 Other muscle spasm: Secondary | ICD-10-CM | POA: Diagnosis not present

## 2018-07-03 DIAGNOSIS — G8929 Other chronic pain: Secondary | ICD-10-CM

## 2018-07-03 DIAGNOSIS — K5902 Outlet dysfunction constipation: Secondary | ICD-10-CM

## 2018-07-03 DIAGNOSIS — M545 Low back pain: Secondary | ICD-10-CM

## 2018-07-03 DIAGNOSIS — M6208 Separation of muscle (nontraumatic), other site: Secondary | ICD-10-CM

## 2018-07-03 NOTE — Therapy (Signed)
[place] MAIN [facility] SERVICES [address] [place], [place], [address] Phone: [phone]   Fax:  [phone]  Physical Therapy Treatment  Patient Details  Name: Hannah Hancock MRN: [id number] Date of Birth: [birth year] Referring Provider (PT): Lisette Grinder   Encounter Date: 07/03/2018  PT End of Session - 07/06/18 2259    Visit Number  19    Number of Visits  25    Date for PT Re-Evaluation  08/14/18    Authorization Type  Medicaid    Authorization - Visit Number  2    Authorization - Number of Visits  8    PT Start Time  1430    PT Stop Time  1530    PT Time Calculation (min)  60 min    Activity Tolerance  Patient tolerated treatment well    Behavior During Therapy  [facility] for tasks assessed/performed       Past Medical History:  Diagnosis Date  . Abdominal pain, recurrent   . 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 Anxiety   . Asthma    no meds, no recent attacks  . Gallstones   . Tendinitis    R hip  . Urinary tract infection    Last one 4 months ago  . Weight loss     Past Surgical History:  Procedure Laterality Date  . ADENOIDECTOMY    . CHOLECYSTECTOMY N/A 11/03/2017   Procedure: LAPAROSCOPIC CHOLECYSTECTOMY;  Surgeon: Herbert Pun, MD;  Location: [facility] ORS;  Service: General;  Laterality: N/A;  . ENDOSCOPIC RETROGRADE CHOLANGIOPANCREATOGRAPHY (ERCP) WITH PROPOFOL N/A 11/02/2017   Procedure: ENDOSCOPIC RETROGRADE CHOLANGIOPANCREATOGRAPHY (ERCP) WITH PROPOFOL;  Surgeon: Lucilla Lame, MD;  Location: [facility] ENDOSCOPY;  Service: Endoscopy;  Laterality: N/A;  . ESOPHAGOGASTRODUODENOSCOPY  10/20/2011   Procedure: ESOPHAGOGASTRODUODENOSCOPY (EGD);  Surgeon: [name], MD;  Location: [place];  Service: Gastroenterology;  Laterality: N/A;    There were no vitals filed for this visit.    Pelvic Floor Physical Therapy Treatment Note  SCREENING  Changes in medications, allergies, or medical history?:  no    SUBJECTIVE  Patient reports: She has had no leakage for the last couple days but it seems to come and go.  Precautions:  Breastfeeding  Pain update: No pain  Patient Goals: Decrease Urinary leakage, back pain,a and improve relaxation for intercourse.   OBJECTIVE  Changes in:  Pelvic floor: Demonstrates maintained improvement in relaxation to allow for vaginal penetration and ability to tolerate light pressure but continued pain and restriction of scar tissue on L>R through posterior fourchette.  INTERVENTIONS THIS SESSION: Manual: Performed scar mobilization and MFR through MWM as Pt. Performed posterior/anterior pelvic tilts with coordinated breathing.   Total time: 60 min.                          PT Education - 07/06/18 2258    Education provided  Yes    Education Details  See Interventions this session    Person(s) Educated  Patient    Methods  Explanation    Comprehension  Verbalized understanding       PT Short Term Goals - 05/24/18 1204      PT SHORT TERM GOAL #1   Title  Patient will demonstrate a coordinated contraction, relaxation, and bulge of the pelvic floor muscles to demonstrate functional recruitment and motion and allow for further strengthening.    Time  6    Period  Weeks  Status  Achieved    Target Date  01/10/18      PT SHORT TERM GOAL #2   Title  Patient will demonstrate improved pelvic alignment and balance of musculature surrounding the pelvis to facilitate decreased PFM spasms and decrease pelvic pain.    Time  3    Period  Weeks    Target Date  12/20/17      PT SHORT TERM GOAL #3   Title  Patient will report consistent use of foot-stool (squatty-potty) for positioning with BM to decrease pain with BM and intra-abdominal pressure.    Baseline  Pt. did not remember education on stool so it was reviewed this session. Patient has decided not to buy stool due to expense and improvement of BM ease.    Time  3     Period  Weeks    Status  Not Met    Target Date  03/27/18      PT SHORT TERM GOAL #4   Title  Patient will demonstrate HEP x1 in the clinic to demonstrate understanding and proper form to allow for further improvement.    Time  3    Period  Weeks    Status  Achieved        PT Long Term Goals - 05/22/18 1446      PT LONG TERM GOAL #1   Title  Patient will report no episodes of SUI over the course of the prior two weeks to demonstrate improved functional ability.    Time  12    Period  Weeks    Status  Achieved    Target Date  05/01/18      PT LONG TERM GOAL #2   Title  Patient will score at or below 2/300 on the PFDI and 0/300 on the PFIQ to demonstrate a clinically meaningful decrease in disability and distress due to pelvic floor dysfunction.    Baseline  PFDI:47/300, PFIQ: 22/300, as of 02/20/18: PFDI: 31/300, PFIQ: 24/300, As of 05/22/2018: PFDI: 6/300, PFIQ: 5/300.    Time  12    Period  Weeks    Status  On-going    Target Date  07/17/18      PT LONG TERM GOAL #3   Title  Patient will report having BM's at least every-other day with consistency between [facility] stool scale 3-5 over the prior week to demonstrate decreased constipation.    Time  12    Period  Weeks    Status  Achieved    Target Date  05/01/18      PT LONG TERM GOAL #4   Title  Patient will describe ability to have intercourse without vaginismus/penetration difficulty to demonstrate improved PFM relaxation and improved QOL.    Baseline  Has not tried intercourse but continues to have pain with pressure to posterior fourchette.    Time  12    Period  Weeks    Status  On-going    Target Date  07/17/18      PT LONG TERM GOAL #5   Title  Patient will be able to stand or walf for greater than 2 hours without an increase in LBP greater than 2/10 to demonstrate improved quality of life and functional ability.    Baseline  1 hour of standing or walking increases pain to highest level (5/10) As of 02/20/18: Pain  s now a 4 at worst when moving boxes etc.     Time  12    Status  Achieved    Target Date  05/01/18            Plan - 07/06/18 2300    Clinical Impression Statement  Pt. responded well to all interventions today, demonstrating improved length of perenial scars B and decreased pain with pressure through the posterior fourchette. Continue per POC.    Clinical Presentation  Stable    Clinical Decision Making  Moderate    Rehab Potential  Good    Clinical Impairments Affecting Rehab Potential  Grade 3 tearing with delivery, diastasis recti, recent abdominal surgery    PT Frequency  1x / week    PT Duration  8 weeks    PT Treatment/Interventions  Biofeedback;Electrical Stimulation;Traction;Moist Heat;Functional mobility training;Therapeutic activities;Therapeutic exercise;Patient/family education;Neuromuscular re-education;Manual techniques;Scar mobilization;Energy conservation;Dry needling;Passive range of motion    PT Next Visit Plan  Further fascial mobility around perenium using MWM, check Pubic symphysis alignment, consider rectal work for scar. give information on how to progress Dilators for further home treatment?    PT Home Exercise Plan  Child's pose, diastasis approximation, 3-way wall stretch and pelvic tilts in seated, piriformis stretch, side-stretch, TA in quadruped, bird-dog, [id number], lengthening kegels      Consulted and Agree with Plan of Care  Patient       Patient will benefit from skilled therapeutic intervention in order to improve the following deficits and impairments:  Decreased skin integrity, Increased fascial restricitons, Impaired sensation, Improper body mechanics, Pain, Decreased coordination, Decreased scar mobility, Increased muscle spasms, Impaired tone, Postural dysfunction, Decreased activity tolerance, Decreased range of motion, Decreased strength, Decreased balance, Difficulty walking  Visit Diagnosis: Other muscle spasm  Chronic midline low back pain  without sciatica  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 Common bile duct stone   . Abnormal liver function tests   . Abnormal magnetic resonance imaging of liver   . Transaminitis 11/01/2017  . Third degree laceration of perineum during delivery, postpartum 10/06/2017  . Gestational hypertension 10/03/2017  . Abdominal pain affecting pregnancy 06/28/2017  . 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 Presence of subdermal contraceptive device 04/07/2014  . Sleep disturbance 11/03/2013  . Adjustment disorder with mixed anxiety and depressed mood 08/12/2013   Willa Rough DPT, ATC Willa Rough 07/06/2018, 11:08 PM  [place] MAIN [facility] SERVICES [address] [place], [place], [address] Phone: [phone]   Fax:  [phone]  Name: CING  MRN: [id number] Date of Birth: 08-Jan-1996

## 2018-07-16 ENCOUNTER — Ambulatory Visit: Payer: BLUE CROSS/BLUE SHIELD

## 2018-07-16 DIAGNOSIS — M545 Low back pain, unspecified: Secondary | ICD-10-CM

## 2018-07-16 DIAGNOSIS — M62838 Other muscle spasm: Secondary | ICD-10-CM | POA: Diagnosis not present

## 2018-07-16 DIAGNOSIS — G8929 Other chronic pain: Secondary | ICD-10-CM

## 2018-07-16 DIAGNOSIS — K5902 Outlet dysfunction constipation: Secondary | ICD-10-CM

## 2018-07-16 DIAGNOSIS — M6208 Separation of muscle (nontraumatic), other site: Secondary | ICD-10-CM

## 2018-07-16 NOTE — Therapy (Signed)
[place] MAIN [facility] SERVICES [address] [place], [place], [address] Phone: [phone]   Fax:  [phone]  Physical Therapy Treatment  Patient Details  Name: Hannah Hancock MRN: [id number] Date of Birth: Jan 27, 1996 Referring Provider (PT): Lisette Grinder   Encounter Date: 07/16/2018  PT End of Session - 07/16/18 1645    Visit Number  20    Number of Visits  25    Date for PT Re-Evaluation  08/14/18    Authorization Type  Medicaid    Authorization - Visit Number  3    Authorization - Number of Visits  8    PT Start Time  6568    PT Stop Time  1505    PT Time Calculation (min)  60 min    Activity Tolerance  Patient tolerated treatment well    Behavior During Therapy  [facility] for tasks assessed/performed       Past Medical History:  Diagnosis Date  . Abdominal pain, recurrent   . 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 Anxiety   . Asthma    no meds, no recent attacks  . Gallstones   . Tendinitis    R hip  . Urinary tract infection    Last one 4 months ago  . Weight loss     Past Surgical History:  Procedure Laterality Date  . ADENOIDECTOMY    . CHOLECYSTECTOMY N/A 11/03/2017   Procedure: LAPAROSCOPIC CHOLECYSTECTOMY;  Surgeon: Herbert Pun, MD;  Location: [facility] ORS;  Service: General;  Laterality: N/A;  . ENDOSCOPIC RETROGRADE CHOLANGIOPANCREATOGRAPHY (ERCP) WITH PROPOFOL N/A 11/02/2017   Procedure: ENDOSCOPIC RETROGRADE CHOLANGIOPANCREATOGRAPHY (ERCP) WITH PROPOFOL;  Surgeon: Lucilla Lame, MD;  Location: [facility] ENDOSCOPY;  Service: Endoscopy;  Laterality: N/A;  . ESOPHAGOGASTRODUODENOSCOPY  10/20/2011   Procedure: ESOPHAGOGASTRODUODENOSCOPY (EGD);  Surgeon: [name], MD;  Location: [place];  Service: Gastroenterology;  Laterality: N/A;    There were no vitals filed for this visit.    Pelvic Floor Physical Therapy Treatment Note  SCREENING  Changes in medications, allergies, or medical history?:  no    SUBJECTIVE  Patient reports: She aggravated her back rolling on the ground with her daughter. Has not had any leakage since last visit. Has not had intercourse to test for pain.  Precautions:  Breastfeeding  Pain update:  Location of pain: low back Current pain:  5/10  Max pain:  5/10 Least pain:  0/10 Nature of pain: tight/pinchy  * no pain following treatment.  Patient Goals: Decrease Urinary leakage, back pain,a and improve relaxation for intercourse.    OBJECTIVE  Changes in: Posture/Observations:  R Innominate anteriorly rotated  And inferiorly displaced at the pubic symphysis. Mild L rib shift in standing.  Range of Motion/Flexibilty:  Decreased mobility at L>R sacral border with pain upon pressure.   Palpation: TTP to B lumbar paraspinals and R Pectineus and Gracilus    INTERVENTIONS THIS SESSION: Manual: Performed STM and TP release to B  lumbar paraspinals and R Pectineus and Gracilus to decrease pain and spasm and allow for improved success with mobs to re-align the pelvis. Performed PA sacral mobs to B borders and posteriorly directed rotational mobs to R innominate to improve pelvic alignment and decrease pain and potential for spasms to return.  Dry-needle: Performed TPDN with a .30x83m needle to  lumbar paraspinals to decrease pain and spasm and allow for improved pelvic alignment through other manual techniques. Therex: educated on and practiced lateral rib shifts to the R  to improve spinal alignment and prevent return of spasm and pain.    Total time: 60 min.                          PT Education - 07/16/18 1644    Education Details  See Pt. Instructions and Interventions this session.    Person(s) Educated  Patient    Methods  Explanation;Demonstration;Verbal cues;Handout    Comprehension  Verbalized understanding;Returned demonstration;Verbal cues required       PT Short Term Goals - 05/24/18 1204      PT SHORT  TERM GOAL #1   Title  Patient will demonstrate a coordinated contraction, relaxation, and bulge of the pelvic floor muscles to demonstrate functional recruitment and motion and allow for further strengthening.    Time  6    Period  Weeks    Status  Achieved    Target Date  01/10/18      PT SHORT TERM GOAL #2   Title  Patient will demonstrate improved pelvic alignment and balance of musculature surrounding the pelvis to facilitate decreased PFM spasms and decrease pelvic pain.    Time  3    Period  Weeks    Target Date  12/20/17      PT SHORT TERM GOAL #3   Title  Patient will report consistent use of foot-stool (squatty-potty) for positioning with BM to decrease pain with BM and intra-abdominal pressure.    Baseline  Pt. did not remember education on stool so it was reviewed this session. Patient has decided not to buy stool due to expense and improvement of BM ease.    Time  3    Period  Weeks    Status  Not Met    Target Date  03/27/18      PT SHORT TERM GOAL #4   Title  Patient will demonstrate HEP x1 in the clinic to demonstrate understanding and proper form to allow for further improvement.    Time  3    Period  Weeks    Status  Achieved        PT Long Term Goals - 05/22/18 1446      PT LONG TERM GOAL #1   Title  Patient will report no episodes of SUI over the course of the prior two weeks to demonstrate improved functional ability.    Time  12    Period  Weeks    Status  Achieved    Target Date  05/01/18      PT LONG TERM GOAL #2   Title  Patient will score at or below 2/300 on the PFDI and 0/300 on the PFIQ to demonstrate a clinically meaningful decrease in disability and distress due to pelvic floor dysfunction.    Baseline  PFDI:47/300, PFIQ: 22/300, as of 02/20/18: PFDI: 31/300, PFIQ: 24/300, As of 05/22/2018: PFDI: 6/300, PFIQ: 5/300.    Time  12    Period  Weeks    Status  On-going    Target Date  07/17/18      PT LONG TERM GOAL #3   Title  Patient will report  having BM's at least every-other day with consistency between [facility] stool scale 3-5 over the prior week to demonstrate decreased constipation.    Time  12    Period  Weeks    Status  Achieved    Target Date  05/01/18      PT LONG TERM GOAL #4  Title  Patient will describe ability to have intercourse without vaginismus/penetration difficulty to demonstrate improved PFM relaxation and improved QOL.    Baseline  Has not tried intercourse but continues to have pain with pressure to posterior fourchette.    Time  12    Period  Weeks    Status  On-going    Target Date  07/17/18      PT LONG TERM GOAL #5   Title  Patient will be able to stand or walf for greater than 2 hours without an increase in LBP greater than 2/10 to demonstrate improved quality of life and functional ability.    Baseline  1 hour of standing or walking increases pain to highest level (5/10) As of 02/20/18: Pain s now a 4 at worst when moving boxes etc.     Time  12    Status  Achieved    Target Date  05/01/18            Plan - 07/16/18 1645    Clinical Impression Statement  Pt. responded well to all interventions today, demonstrating resoolution of pain and spasm and improved pelvic alignment with some slight rib-shift still evident following treatment. She demonstrated understanding of how to perform new exercise appropriately and she has not had any leakage since prior visit two weeks ago. Continue per POC.    Clinical Presentation  Stable    Clinical Decision Making  Moderate    Rehab Potential  Good    Clinical Impairments Affecting Rehab Potential  Grade 3 tearing with delivery, diastasis recti, recent abdominal surgery    PT Frequency  1x / week    PT Duration  8 weeks    PT Treatment/Interventions  Biofeedback;Electrical Stimulation;Traction;Moist Heat;Functional mobility training;Therapeutic activities;Therapeutic exercise;Patient/family education;Neuromuscular re-education;Manual techniques;Scar  mobilization;Energy conservation;Dry needling;Passive range of motion    PT Next Visit Plan  Further fascial mobility around perenium using MWM, re-check Pubic symphysis alignment, consider rectal work for scar. give information on how to progress Dilators for further home treatment?    PT Home Exercise Plan  Child's pose, diastasis approximation, 3-way wall stretch and pelvic tilts in seated, piriformis stretch, side-stretch, TA in quadruped, bird-dog, [id number], lengthening kegels      Consulted and Agree with Plan of Care  Patient       Patient will benefit from skilled therapeutic intervention in order to improve the following deficits and impairments:  Decreased skin integrity, Increased fascial restricitons, Impaired sensation, Improper body mechanics, Pain, Decreased coordination, Decreased scar mobility, Increased muscle spasms, Impaired tone, Postural dysfunction, Decreased activity tolerance, Decreased range of motion, Decreased strength, Decreased balance, Difficulty walking  Visit Diagnosis: Other muscle spasm  Chronic midline low back pain without sciatica  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 Common bile duct stone   . Abnormal liver function tests   . Abnormal magnetic resonance imaging of liver   . Transaminitis 11/01/2017  . Third degree laceration of perineum during delivery, postpartum 10/06/2017  . Gestational hypertension 10/03/2017  . Abdominal pain affecting pregnancy 06/28/2017  . 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 Presence of subdermal contraceptive device 04/07/2014  . Sleep disturbance 11/03/2013  . Adjustment disorder with mixed anxiety and depressed mood 08/12/2013   Willa Rough DPT, ATC Willa Rough 07/16/2018, 4:48 PM  [place] MAIN [facility] SERVICES [address] [place], [place], [address]  Phone:  [phone]   Fax:  [phone]  Name: COLA GANE MRN: [id number] Date of Birth: December 13, 1995

## 2018-07-16 NOTE — Patient Instructions (Signed)
   Exhale as you shift Right. Do 2x10 once per day for the next week.

## 2018-07-19 ENCOUNTER — Encounter: Payer: Self-pay | Admitting: Emergency Medicine

## 2018-07-19 ENCOUNTER — Other Ambulatory Visit: Payer: Self-pay

## 2018-07-19 ENCOUNTER — Inpatient Hospital Stay
Admission: EM | Admit: 2018-07-19 | Discharge: 2018-07-20 | DRG: 392 | Disposition: A | Discharge status: Home / Self Care | Payer: BLUE CROSS/BLUE SHIELD | Attending: Internal Medicine | Admitting: Internal Medicine

## 2018-07-19 ENCOUNTER — Emergency Department: Payer: BLUE CROSS/BLUE SHIELD

## 2018-07-19 DIAGNOSIS — K2900 Acute gastritis without bleeding: Principal | ICD-10-CM | POA: Diagnosis present

## 2018-07-19 DIAGNOSIS — F419 Anxiety disorder, unspecified: Secondary | ICD-10-CM | POA: Diagnosis present

## 2018-07-19 DIAGNOSIS — Z79899 Other long term (current) drug therapy: Secondary | ICD-10-CM

## 2018-07-19 DIAGNOSIS — Z87891 Personal history of nicotine dependence: Secondary | ICD-10-CM

## 2018-07-19 DIAGNOSIS — Z23 Encounter for immunization: Secondary | ICD-10-CM | POA: Diagnosis not present

## 2018-07-19 DIAGNOSIS — R112 Nausea with vomiting, unspecified: Secondary | ICD-10-CM

## 2018-07-19 DIAGNOSIS — E860 Dehydration: Secondary | ICD-10-CM | POA: Diagnosis present

## 2018-07-19 DIAGNOSIS — K5900 Constipation, unspecified: Secondary | ICD-10-CM | POA: Diagnosis present

## 2018-07-19 DIAGNOSIS — F900 Attention-deficit hyperactivity disorder, predominantly inattentive type: Secondary | ICD-10-CM | POA: Diagnosis present

## 2018-07-19 DIAGNOSIS — A084 Viral intestinal infection, unspecified: Secondary | ICD-10-CM | POA: Diagnosis present

## 2018-07-19 DIAGNOSIS — N390 Urinary tract infection, site not specified: Secondary | ICD-10-CM | POA: Diagnosis present

## 2018-07-19 LAB — COMPREHENSIVE METABOLIC PANEL
ALK PHOS: 123 U/L (ref 38–126)
ALT: 21 U/L (ref 0–44)
AST: 25 U/L (ref 15–41)
Albumin: 4.4 g/dL (ref 3.5–5.0)
Anion gap: 10 (ref 5–15)
BILIRUBIN TOTAL: 0.6 mg/dL (ref 0.3–1.2)
BUN: 16 mg/dL (ref 6–20)
CHLORIDE: 108 mmol/L (ref 98–111)
CO2: 22 mmol/L (ref 22–32)
CREATININE: 0.51 mg/dL (ref 0.44–1.00)
Calcium: 8.9 mg/dL (ref 8.9–10.3)
GFR calc Af Amer: >60 mL/min (ref >60)
GLUCOSE: 129 mg/dL — AB (ref 70–99)
POTASSIUM: 3.8 mmol/L (ref 3.5–5.1)
SODIUM: 140 mmol/L (ref 135–145)
Total Protein: 7.6 g/dL (ref 6.5–8.1)

## 2018-07-19 LAB — CBC
HCT: 39 % (ref 36.0–46.0)
HEMOGLOBIN: 12.8 g/dL (ref 12.0–15.0)
MCH: 26.9 pg (ref 26.0–34.0)
MCHC: 32.8 g/dL (ref 30.0–36.0)
MCV: 82.1 fL (ref 80.0–100.0)
Platelets: 280 10*3/uL (ref 150–400)
RBC: 4.75 MIL/uL (ref 3.87–5.11)
RDW: 12.9 % (ref 11.5–15.5)
WBC: 13.3 10*3/uL — ABNORMAL HIGH (ref 4.0–10.5)
nRBC: 0 % (ref 0.0–0.2)

## 2018-07-19 LAB — URINALYSIS, COMPLETE (UACMP) WITH MICROSCOPIC
Bacteria, UA: NONE SEEN
Bilirubin Urine: NEGATIVE
Glucose, UA: NEGATIVE mg/dL
Hgb urine dipstick: NEGATIVE
Ketones, ur: NEGATIVE mg/dL
NITRITE: NEGATIVE
PROTEIN: 30 mg/dL — AB
SPECIFIC GRAVITY, URINE: 1.027 (ref 1.005–1.030)
pH: 6 (ref 5.0–8.0)

## 2018-07-19 LAB — URINE DRUG SCREEN, QUALITATIVE (ARMC ONLY)
Amphetamines, Ur Screen: NOT DETECTED
Barbiturates, Ur Screen: NOT DETECTED
Benzodiazepine, Ur Scrn: NOT DETECTED
CANNABINOID 50 NG, UR ~~LOC~~: NOT DETECTED
Cocaine Metabolite,Ur ~~LOC~~: NOT DETECTED
MDMA (Ecstasy)Ur Screen: NOT DETECTED
Methadone Scn, Ur: NOT DETECTED
Opiate, Ur Screen: NOT DETECTED
Phencyclidine (PCP) Ur S: NOT DETECTED
Tricyclic, Ur Screen: NOT DETECTED

## 2018-07-19 LAB — LIPASE, BLOOD: Lipase: 34 U/L (ref 11–51)

## 2018-07-19 LAB — POCT PREGNANCY, URINE: PREG TEST UR: NEGATIVE

## 2018-07-19 MED ORDER — ONDANSETRON HCL 4 MG/2ML IJ SOLN
4.0000 mg | Freq: Four times a day (QID) | INTRAMUSCULAR | Status: DC | PRN
  Administered 2018-07-19: 15:00:00 4 mg via INTRAVENOUS
  Filled 2018-07-19: qty 2

## 2018-07-19 MED ORDER — ENOXAPARIN SODIUM 40 MG/0.4ML ~~LOC~~ SOLN
40.0000 mg | SUBCUTANEOUS | Status: DC
  Administered 2018-07-19: 40 mg via SUBCUTANEOUS
  Filled 2018-07-19 (×2): qty 0.4

## 2018-07-19 MED ORDER — SENNOSIDES-DOCUSATE SODIUM 8.6-50 MG PO TABS
2.0000 | ORAL_TABLET | ORAL | Status: DC
  Administered 2018-07-19: 2 via ORAL
  Filled 2018-07-19 (×2): qty 2

## 2018-07-19 MED ORDER — ONDANSETRON HCL 4 MG/2ML IJ SOLN
4.0000 mg | Freq: Once | INTRAMUSCULAR | Status: AC
  Administered 2018-07-19: 4 mg via INTRAVENOUS

## 2018-07-19 MED ORDER — SODIUM CHLORIDE 0.9 % IV SOLN
1.0000 g | INTRAVENOUS | Status: DC
  Administered 2018-07-19 – 2018-07-20 (×2): 1 g via INTRAVENOUS
  Filled 2018-07-19: qty 10
  Filled 2018-07-19: qty 1

## 2018-07-19 MED ORDER — FENTANYL CITRATE (PF) 100 MCG/2ML IJ SOLN
50.0000 ug | Freq: Once | INTRAMUSCULAR | Status: AC
  Administered 2018-07-19: 50 ug via INTRAVENOUS
  Filled 2018-07-19: qty 2

## 2018-07-19 MED ORDER — SODIUM CHLORIDE 0.9 % IV BOLUS
1000.0000 mL | Freq: Once | INTRAVENOUS | Status: AC
  Administered 2018-07-19: 1000 mL via INTRAVENOUS

## 2018-07-19 MED ORDER — SODIUM CHLORIDE 0.9 % IV SOLN
INTRAVENOUS | Status: AC
  Administered 2018-07-19 – 2018-07-20 (×2): via INTRAVENOUS

## 2018-07-19 MED ORDER — SERTRALINE HCL 50 MG PO TABS
25.0000 mg | ORAL_TABLET | Freq: Every day | ORAL | Status: DC
  Administered 2018-07-19 – 2018-07-20 (×2): 25 mg via ORAL
  Filled 2018-07-19 (×2): qty 1

## 2018-07-19 MED ORDER — FAMOTIDINE IN NACL 20-0.9 MG/50ML-% IV SOLN
20.0000 mg | INTRAVENOUS | Status: DC
  Administered 2018-07-19: 10:00:00 20 mg via INTRAVENOUS
  Filled 2018-07-19 (×2): qty 50

## 2018-07-19 MED ORDER — ACETAMINOPHEN 650 MG RE SUPP: 650.0000 mg | Freq: Four times a day (QID) | RECTAL | Status: DC | PRN

## 2018-07-19 MED ORDER — ACETAMINOPHEN 325 MG PO TABS: 650.0000 mg | ORAL_TABLET | Freq: Four times a day (QID) | ORAL | Status: DC | PRN

## 2018-07-19 MED ORDER — IOPAMIDOL (ISOVUE-300) INJECTION 61%
100.0000 mL | Freq: Once | INTRAVENOUS | Status: AC | PRN
  Administered 2018-07-19: 100 mL via INTRAVENOUS

## 2018-07-19 MED ORDER — ONDANSETRON HCL 4 MG/2ML IJ SOLN
4.0000 mg | Freq: Once | INTRAMUSCULAR | Status: AC
  Administered 2018-07-19: 4 mg via INTRAVENOUS
  Filled 2018-07-19: qty 2

## 2018-07-19 MED ORDER — ONDANSETRON HCL 4 MG/2ML IJ SOLN
INTRAMUSCULAR | Status: AC
  Administered 2018-07-19: 4 mg via INTRAVENOUS
  Filled 2018-07-19: qty 4

## 2018-07-19 MED ORDER — METOCLOPRAMIDE HCL 5 MG/ML IJ SOLN
10.0000 mg | Freq: Once | INTRAMUSCULAR | Status: AC
  Administered 2018-07-19: 10 mg via INTRAVENOUS
  Filled 2018-07-19: qty 2

## 2018-07-19 MED ORDER — POLYETHYLENE GLYCOL 3350 17 G PO PACK
17.0000 g | PACK | Freq: Every day | ORAL | Status: DC
  Administered 2018-07-19: 17 g via ORAL
  Filled 2018-07-19: qty 1

## 2018-07-19 MED ORDER — DIPHENHYDRAMINE HCL 50 MG/ML IJ SOLN
25.0000 mg | Freq: Once | INTRAMUSCULAR | Status: AC
  Administered 2018-07-19: 25 mg via INTRAVENOUS
  Filled 2018-07-19: qty 1

## 2018-07-19 MED ORDER — INFLUENZA VAC SPLIT QUAD 0.5 ML IM SUSY
0.5000 mL | PREFILLED_SYRINGE | INTRAMUSCULAR | Status: AC
  Administered 2018-07-20: 0.5 mL via INTRAMUSCULAR
  Filled 2018-07-19: qty 0.5

## 2018-07-19 MED ORDER — ONDANSETRON HCL 4 MG PO TABS: 4.0000 mg | ORAL_TABLET | Freq: Four times a day (QID) | ORAL | Status: DC | PRN

## 2018-07-19 NOTE — ED Notes (Signed)
Patient transported to CT 

## 2018-07-19 NOTE — H&P (Signed)
Hannah Hancock - [place] at [facility]   PATIENT NAME: Hannah Hancock    MR#:  [id number]  DATE OF BIRTH:  16-Jun-1996  DATE OF ADMISSION:  07/19/2018  PRIMARY CARE PHYSICIAN: Leotis Shames, MD   REQUESTING/REFERRING PHYSICIAN: Rifenbark  CHIEF COMPLAINT:  Intractable nausea and vomiting  HISTORY OF PRESENT ILLNESS:  Hannah Hancock  is a 22 y.o. female with a known history of anxiety, ADHD, and nursing 31-month-old baby is presenting to the ED with a chief complaint of intractable nausea and vomiting which was started after Thanksgiving dinner yesterday night.  Patient is having epigastric abdominal pain came into the emergency department CT abdomen has revealed significant constipation with stool burden and cholecystectomy but no other abnormalities.  Patient was given Benadryl IV and Zofran IV in the emergency department with fluid boluses.  Patient was completely lethargic during my examination and history obtained from patient's mom at bedside  PAST MEDICAL HISTORY:   Past Medical History:  Diagnosis Date  . Abdominal pain, recurrent   . 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 Anxiety   . Asthma    no meds, no recent attacks  . Gallstones   . Tendinitis    R hip  . Urinary tract infection    Last one 4 months ago  . Weight loss     PAST SURGICAL HISTOIRY:   Past Surgical History:  Procedure Laterality Date  . ADENOIDECTOMY    . CHOLECYSTECTOMY N/A 11/03/2017   Procedure: LAPAROSCOPIC CHOLECYSTECTOMY;  Surgeon: Carolan Shiver, MD;  Location: [facility] ORS;  Service: General;  Laterality: N/A;  . ENDOSCOPIC RETROGRADE CHOLANGIOPANCREATOGRAPHY (ERCP) WITH PROPOFOL N/A 11/02/2017   Procedure: ENDOSCOPIC RETROGRADE CHOLANGIOPANCREATOGRAPHY (ERCP) WITH PROPOFOL;  Surgeon: Midge Minium, MD;  Location: [facility] ENDOSCOPY;  Service: Endoscopy;  Laterality: N/A;  . ESOPHAGOGASTRODUODENOSCOPY  10/20/2011   Procedure: ESOPHAGOGASTRODUODENOSCOPY (EGD);   Surgeon: Jon Gills, MD;  Location: [facility] OR;  Service: Gastroenterology;  Laterality: N/A;    SOCIAL HISTORY:   Social History   Tobacco Use  . Smoking status: Former Smoker    Packs/day: 0.30    Years: 1.00    Pack years: 0.30    Types: Cigarettes    Last attempt to quit: 05/29/2015    Years since quitting: 3.1  . Smokeless tobacco: Never Used  Substance Use Topics  . Alcohol use: No    Alcohol/week: 0.0 standard drinks    FAMILY HISTORY:   Family History  Problem Relation Age of Onset  . Cholelithiasis Mother   . Miscarriages / [place] Mother   . Hypertension Maternal Grandmother   . Kidney disease Maternal Grandmother   . Stroke Maternal Grandmother   . Diabetes Paternal Grandfather   . Hypertension Paternal Grandfather     DRUG ALLERGIES:  No Known Allergies  REVIEW OF SYSTEMS:  Review of system unobtainable as the patient is lethargic  MEDICATIONS AT HOME:   Prior to Admission medications   Medication Sig Start Date End Date Taking? Authorizing Provider  ondansetron (ZOFRAN ODT) 4 MG disintegrating tablet Take 1 tablet (4 mg total) by mouth every 8 (eight) hours as needed for nausea or vomiting. 11/01/17  Yes Don Perking, [place], MD  Prenatal Vit-Fe Fumarate-FA (PRENATAL VITAMIN PO) Take 1 tablet by mouth daily.   Yes [provider]  sertraline (ZOLOFT) 25 MG tablet Take 25 mg by mouth daily. 10/24/17  Yes [provider]  alum & mag hydroxide-simeth (MAALOX MAX) 400-400-40 MG/5ML suspension Take 5 mLs by mouth every 6 (  six) hours as needed for indigestion. Patient not taking: Reported on 11/28/2017 11/01/17   Nita Sickle, MD  famotidine (PEPCID) 20 MG tablet Take 1 tablet (20 mg total) by mouth 2 (two) times daily. Patient not taking: Reported on 11/28/2017 11/01/17 11/01/18  Nita Sickle, MD  HYDROcodone-acetaminophen (NORCO/VICODIN) 5-325 MG tablet Take 1 tablet by mouth every 4 (four) hours as needed for moderate pain. Patient  not taking: Reported on 11/28/2017 11/04/17 11/04/18  Earline Mayotte, MD  Multiple Vitamin (MULTIVITAMIN) tablet Take 1 tablet by mouth daily.    [provider]  polyethylene glycol (MIRALAX / GLYCOLAX) packet Take 17 g by mouth daily as needed.    [provider]  senna-docusate (SENOKOT-S) 8.6-50 MG tablet Take 2 tablets by mouth daily. Patient not taking: Reported on 11/01/2017 10/06/17   [name], Elenora Fender, MD      VITAL SIGNS:  Blood pressure 105/71, pulse 86, temperature 98.1 F (36.7 C), temperature source Oral, resp. rate 18, height 5\' 3"  (1.6 m), weight 69.9 kg, last menstrual period 06/25/2018, SpO2 97 %, currently breastfeeding.  PHYSICAL EXAMINATION:  GENERAL:  22 y.o.-year-old patient lying in the bed with no acute distress.  EYES: Pupils equal, round, reactive to light and accommodation. No scleral icterus. Extraocular muscles intact.  HEENT: Head atraumatic, normocephalic. Oropharynx and nasopharynx clear.  NECK:  Supple, no jugular venous distention. No thyroid enlargement, no tenderness.  LUNGS: Normal breath sounds bilaterally, no wheezing, rales,rhonchi or crepitation. No use of accessory muscles of respiration.  CARDIOVASCULAR: S1, S2 normal. No murmurs, rubs, or gallops.  ABDOMEN: Soft, nontender, nondistended. Bowel sounds present.Marland Kitchen  EXTREMITIES: No pedal edema, cyanosis, or clubbing.  NEUROLOGIC: Lethargic sensation intact. Gait not checked.  PSYCHIATRIC: The patient is lethargic.  SKIN: No obvious rash, lesion, or ulcer.   LABORATORY PANEL:   CBC Recent Labs  Lab 07/19/18 0407  WBC 13.3*  HGB 12.8  HCT 39.0  PLT 280   ------------------------------------------------------------------------------------------------------------------  Chemistries  Recent Labs  Lab 07/19/18 0407  NA 140  K 3.8  CL 108  CO2 22  GLUCOSE 129*  BUN 16  CREATININE 0.51  CALCIUM 8.9  AST 25  ALT 21  ALKPHOS 123  BILITOT 0.6    ------------------------------------------------------------------------------------------------------------------  Cardiac Enzymes No results for input(s): TROPONINI in the last 168 hours. ------------------------------------------------------------------------------------------------------------------  RADIOLOGY:  Ct Abdomen Pelvis W Contrast  Result Date: 07/19/2018 CLINICAL DATA:  Initial evaluation for acute abdominal pain, appendicitis suspected. EXAM: CT ABDOMEN AND PELVIS WITH CONTRAST TECHNIQUE: Multidetector CT imaging of the abdomen and pelvis was performed using the standard protocol following bolus administration of intravenous contrast. CONTRAST:  ISOVUE-300 IOPAMIDOL (ISOVUE-300) INJECTION 61% COMPARISON:  Prior CT from 11/01/2017 FINDINGS: Lower chest: Mild subsegmental atelectatic changes seen dependently within the visualized lung bases. Visualized lungs are otherwise clear. Hepatobiliary: Liver demonstrates a normal contrast enhanced appearance. Focal fat deposition noted adjacent to the falciform ligament. Gallbladder surgically absent. No biliary dilatation. Pancreas: Pancreas within normal limits without acute inflammatory changes. Spleen: Spleen within normal limits. Adrenals/Urinary Tract: Adrenal glands are normal. Kidneys equal size with symmetric enhancement. No nephrolithiasis, hydronephrosis, or focal enhancing renal mass. No hydroureter. Partially distended bladder within normal limits. Stomach/Bowel: Stomach within normal limits. No evidence for bowel obstruction. Appendix well visualized within the right lower quadrant and is normal in appearance without inflammatory changes to suggest acute appendicitis. No other acute inflammatory changes seen elsewhere about the bowels. Large volume retained stool within the rectosigmoid colon, suggesting constipation. Vascular/Lymphatic: Normal intravascular  enhancement seen throughout the intra-abdominal aorta and its branch  vessels. No adenopathy. Reproductive: Uterus and ovaries within normal limits. Other: No free air or fluid. Tiny fat containing paraumbilical hernia noted without associated inflammation. Musculoskeletal: No acute osseous abnormality. No discrete lytic or blastic osseous lesions. IMPRESSION: 1. No CT evidence for acute intra-abdominal or pelvic process. Normal appendix. 2. Large volume retained stool within the rectosigmoid colon, suggesting constipation. 3. Status post cholecystectomy. Electronically Signed   By: Rise MuBenjamin  McClintock M.D.   On: 07/19/2018 06:03    EKG:   Orders placed or performed during the hospital encounter of 11/01/17  . ED EKG  . ED EKG  . EKG 12-Lead  . EKG 12-Lead  . EKG    IMPRESSION AND PLAN:   #Acute gastritis intractable nausea and vomiting Admit to MedSurg unit Hydrate with IV fluids, antiemetics for symptomatic treatment Pepcid IV N.p.o. for now Check a.m. Labs Patient is status post cholecystectomy, labs look unremarkable  #Acute cystitis Urine looks abnormal urine culture is sent Provide IV fluids and IV Rocephin in the interim  #Significant constipation Patient was on MiraLAX and Senokot in the past which were stopped by the patient.  Will resume the same for good bowel movement  # leukocytosis can be from the urinary tract infection versus reactive we will continue close monitoring  #ADHD Continue home medication Zoloft and outpatient follow-up with the psychiatry   All the records are reviewed and case discussed with ED provider. Management plans discussed with the patient, family and they are in agreement.  CODE STATUS: FC   TOTAL TIME TAKING CARE OF THIS PATIENT: 42 minutes.   Note: This dictation was prepared with Dragon dictation along with smaller phrase technology. Any transcriptional errors that result from this process are unintentional.  Ramonita LabAruna Edilson Vital M.D on 07/19/2018 at 8:52 AM  Between 7am to 6pm - Pager -  [phone]  After 6pm go to [email or web address] - password [email or web address] [facility]  [place] [facility]  Office  [phone]  CC: Primary care physician; Leotis ShamesSingh, Jasmine, MD

## 2018-07-19 NOTE — ED Notes (Signed)
Dr Lamont Snowballrifenbark at bedside, pt cont to be nauseated, vomiting bile in a small amount

## 2018-07-19 NOTE — ED Notes (Signed)
ED Provider at bedside. 

## 2018-07-19 NOTE — ED Provider Notes (Addendum)
[facility] Emergency Department Provider Note  ____________________________________________   First MD Initiated Contact with Patient 07/19/18 0405     (approximate)  I have reviewed the triage vital signs and the nursing notes.   HISTORY  Chief Complaint Abdominal Pain and Emesis   HPI Hannah Hancock is a 22 y.o. female who self presents to the emergency department with sudden onset severe nausea and vomiting that began acutely at 10 PM roughly 6 hours prior to arrival.  The patient had Thanksgiving dinner with her family and then for about 2 hours prior to the vomiting she had generalized abdominal discomfort.  She is had no fevers or chills.  No diarrhea.  She has vomited more times than she can count.  8 months ago she had choledocholithiasis requiring ERCP followed by cholecystectomy and she did well postoperatively.  She has no history of other abdominal surgeries.  Her symptoms came on suddenly are severe and nothing seems to make them better or worse.  She denies rash.  She still has her appendix.  She is currently breast-feeding a 41-month-old infant.    Past Medical History:  Diagnosis Date  . Abdominal pain, recurrent   . 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 Anxiety   . Asthma    no meds, no recent attacks  . Gallstones   . Tendinitis    R hip  . Urinary tract infection    Last one 4 months ago  . Weight loss     Patient Active Problem List   Diagnosis Date Noted  . UTI (urinary tract infection) 07/19/2018  . Common bile duct stone   . Abnormal liver function tests   . Abnormal magnetic resonance imaging of liver   . Transaminitis 11/01/2017  . Third degree laceration of perineum during delivery, postpartum 10/06/2017  . Gestational hypertension 10/03/2017  . Abdominal pain affecting pregnancy 06/28/2017  . 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 Presence of subdermal contraceptive device 04/07/2014  . Sleep  disturbance 11/03/2013  . Adjustment disorder with mixed anxiety and depressed mood 08/12/2013    Past Surgical History:  Procedure Laterality Date  . ADENOIDECTOMY    . CHOLECYSTECTOMY N/A 11/03/2017   Procedure: LAPAROSCOPIC CHOLECYSTECTOMY;  Surgeon: Carolan Shiver, MD;  Location: [facility] ORS;  Service: General;  Laterality: N/A;  . ENDOSCOPIC RETROGRADE CHOLANGIOPANCREATOGRAPHY (ERCP) WITH PROPOFOL N/A 11/02/2017   Procedure: ENDOSCOPIC RETROGRADE CHOLANGIOPANCREATOGRAPHY (ERCP) WITH PROPOFOL;  Surgeon: Midge Minium, MD;  Location: [facility] ENDOSCOPY;  Service: Endoscopy;  Laterality: N/A;  . ESOPHAGOGASTRODUODENOSCOPY  10/20/2011   Procedure: ESOPHAGOGASTRODUODENOSCOPY (EGD);  Surgeon: Jon Gills, MD;  Location: [facility] OR;  Service: Gastroenterology;  Laterality: N/A;    Prior to Admission medications   Medication Sig Start Date End Date Taking? Authorizing Provider  ondansetron (ZOFRAN ODT) 4 MG disintegrating tablet Take 1 tablet (4 mg total) by mouth every 8 (eight) hours as needed for nausea or vomiting. 11/01/17  Yes Don Perking, [place], MD  Prenatal Vit-Fe Fumarate-FA (PRENATAL VITAMIN PO) Take 1 tablet by mouth daily.   Yes [provider]  sertraline (ZOLOFT) 25 MG tablet Take 25 mg by mouth daily. 10/24/17  Yes [provider]  alum & mag hydroxide-simeth (MAALOX MAX) 400-400-40 MG/5ML suspension Take 5 mLs by mouth every 6 (six) hours as needed for indigestion. Patient not taking: Reported on 11/28/2017 11/01/17   Nita Sickle, MD  famotidine (PEPCID) 20 MG tablet Take 1 tablet (20 mg total) by mouth 2 (two) times daily. Patient  not taking: Reported on 11/28/2017 11/01/17 11/01/18  Nita Sickle, MD  HYDROcodone-acetaminophen (NORCO/VICODIN) 5-325 MG tablet Take 1 tablet by mouth every 4 (four) hours as needed for moderate pain. Patient not taking: Reported on 11/28/2017 11/04/17 11/04/18  Earline Mayotte, MD  Multiple Vitamin (MULTIVITAMIN) tablet Take 1  tablet by mouth daily.    [provider]  polyethylene glycol (MIRALAX / GLYCOLAX) packet Take 17 g by mouth daily as needed.    [provider]  senna-docusate (SENOKOT-S) 8.6-50 MG tablet Take 2 tablets by mouth daily. Patient not taking: Reported on 11/01/2017 10/06/17   [name], Elenora Fender, MD    Allergies Patient has no known allergies.  Family History  Problem Relation Age of Onset  . Cholelithiasis Mother   . Miscarriages / [place] Mother   . Hypertension Maternal Grandmother   . Kidney disease Maternal Grandmother   . Stroke Maternal Grandmother   . Diabetes Paternal Grandfather   . Hypertension Paternal Grandfather     Social History Social History   Tobacco Use  . Smoking status: Former Smoker    Packs/day: 0.30    Years: 1.00    Pack years: 0.30    Types: Cigarettes    Last attempt to quit: 05/29/2015    Years since quitting: 3.1  . Smokeless tobacco: Never Used  Substance Use Topics  . Alcohol use: No    Alcohol/week: 0.0 standard drinks  . Drug use: No    Review of Systems Constitutional: No fever/chills Eyes: No visual changes. ENT: No sore throat. Cardiovascular: Denies chest pain. Respiratory: Denies shortness of breath. Gastrointestinal: Positive for abdominal pain.  Positive for nausea, positive for vomiting.  No diarrhea.  No constipation. Genitourinary: Negative for dysuria. Musculoskeletal: Negative for back pain. Skin: Negative for rash. Neurological: Negative for headaches, focal weakness or numbness.   ____________________________________________   PHYSICAL EXAM:  VITAL SIGNS: ED Triage Vitals  Enc Vitals Group     BP 07/19/18 0356 117/76     Pulse Rate 07/19/18 0356 (!) 109     Resp 07/19/18 0356 16     Temp 07/19/18 0356 98.1 F (36.7 C)     Temp Source 07/19/18 0356 Oral     SpO2 07/19/18 0356 97 %     Weight 07/19/18 0352 154 lb (69.9 kg)     Height 07/19/18 0352 5\' 3"  (1.6 m)     Head Circumference --       Peak Flow --      Pain Score --      Pain Loc --      Pain Edu? --      Excl. in GC? --     Constitutional: Alert and oriented x4 appears extremely uncomfortable holding a vomit bag and holding her upper abdomen Eyes: PERRL EOMI. Head: Atraumatic. Nose: No congestion/rhinnorhea. Mouth/Throat: No trismus Neck: No stridor.   Cardiovascular: Tachycardic rate, regular rhythm. Grossly normal heart sounds.  Good peripheral circulation. Respiratory: Normal respiratory effort.  No retractions. Lungs CTAB and moving good air Gastrointestinal: Soft abdomen with no frank peritonitis.  She is somewhat tender in her right lower quadrant although no rebound or guarding.  Negative Rovsing's. Musculoskeletal: No lower extremity edema   Neurologic:  Normal speech and language. No gross focal neurologic deficits are appreciated. Skin:  Skin is warm, dry and intact. No rash noted. Psychiatric: Mood and affect are normal. Speech and behavior are normal.    ____________________________________________   DIFFERENTIAL includes but not limited to  Appendicitis, urinary tract infection, cannabis hyperemesis, gastroenteritis ____________________________________________   LABS (all labs ordered are listed, but only abnormal results are displayed)  Labs Reviewed  COMPREHENSIVE METABOLIC PANEL - Abnormal; Notable for the following components:      Result Value   Glucose, Bld 129 (*)    All other components within normal limits  CBC - Abnormal; Notable for the following components:   WBC 13.3 (*)    All other components within normal limits  URINALYSIS, COMPLETE (UACMP) WITH MICROSCOPIC - Abnormal; Notable for the following components:   Color, Urine YELLOW (*)    APPearance CLOUDY (*)    Protein, ur 30 (*)    Leukocytes, UA MODERATE (*)    WBC, UA >50 (*)    All other components within normal limits  URINE CULTURE  LIPASE, BLOOD  URINE DRUG SCREEN, QUALITATIVE ([facility] ONLY)  HIV ANTIBODY  (ROUTINE TESTING W REFLEX)  COMPREHENSIVE METABOLIC PANEL  CBC  POC URINE PREG, ED  POCT PREGNANCY, URINE    Lab work reviewed by me shows a large amount of lites and her bacteria and no nitrites.  Elevated white count nonspecific and could be secondary to pain and vomiting versus stress response.  Electrolytes within normal limits __________________________________________  EKG   ____________________________________________  RADIOLOGY  CT abdomen pelvis reviewed by me shows no clear etiology of the patient's symptoms identified ____________________________________________   PROCEDURES  Procedure(s) performed: no  .Critical Care Performed by: Merrily Brittle, MD Authorized by: Merrily Brittle, MD   Critical care provider statement:    Critical care time (minutes):  30   Critical care time was exclusive of:  Separately billable procedures and treating other patients   Critical care was necessary to treat or prevent imminent or life-threatening deterioration of the following conditions:  Dehydration   Critical care was time spent personally by me on the following activities:  Development of treatment plan with patient or surrogate, discussions with consultants, evaluation of patient's response to treatment, examination of patient, obtaining history from patient or surrogate, ordering and performing treatments and interventions, ordering and review of laboratory studies, ordering and review of radiographic studies, pulse oximetry, re-evaluation of patient's condition and review of old charts    Critical Care performed: no  ____________________________________________   INITIAL IMPRESSION / ASSESSMENT AND PLAN / ED COURSE  Pertinent labs & imaging results that were available during my care of the patient were reviewed by me and considered in my medical decision making (see chart for details).   As part of my medical decision making, I reviewed the following data within the  electronic MEDICAL RECORD NUMBER History obtained from family if available, nursing notes, old chart and ekg, as well as notes from prior ED visits.  The patient comes to the emergency department uncomfortable appearing with nausea and vomiting.  Her stomach hurts primarily when retching and vomiting however not really painful at rest.  This is less consistent with appendicitis and could be food poisoning related versus gastroenteritis.  I will give her 8 mg of IV Zofran now along with 50 mcg of IV fentanyl for the pain, a liter of IV fluids, and lab work is pending.     ----------------------------------------- 5:00 AM on 07/19/2018 -----------------------------------------  The patient's urine has a large amount of whites although nitrite negative and no bacteria.  We will send off a culture but at this point I am concerned this could be reactive secondary to appendicitis.  Her pain is adequately controlled after Zofran  and IV fentanyl.  CT scan with IV contrast is pending. ____________________________________________   The patient's vomiting has recurred so I gave another 4 mg of IV Zofran.  Her CT scan is reassuring and does not explain her symptoms.  Following a total of 12 mg of IV Zofran she continued to vomit so I gave her 10 mg of IV Reglan along with IV Benadryl.  At this point she has intractable nausea and vomiting of unclear etiology so she will require inpatient admission given her clinical dehydration.  The patient and family verbalized understanding agree with the plan.  I then discussed with the hospitalist who has graciously agreed to admit the patient to her service.  FINAL CLINICAL IMPRESSION(S) / ED DIAGNOSES  Final diagnoses:  Intractable vomiting with nausea, unspecified vomiting type  Dehydration      NEW MEDICATIONS STARTED DURING THIS VISIT:  Current Discharge Medication List       Note:  This document was prepared using Dragon voice recognition software and may  include unintentional dictation errors.     Merrily Brittleifenbark, Huntley Demedeiros, MD 07/19/18 2223    Merrily Brittleifenbark, Bernadette Gores, MD 07/26/18 [phone]

## 2018-07-19 NOTE — ED Triage Notes (Signed)
Pt with abd pain that started at 2000 and emesis since 2200. Pt states vomited more times than she can count.

## 2018-07-20 LAB — COMPREHENSIVE METABOLIC PANEL
ALK PHOS: 87 U/L (ref 38–126)
ALT: 14 U/L (ref 0–44)
AST: 14 U/L — ABNORMAL LOW (ref 15–41)
Albumin: 3.2 g/dL — ABNORMAL LOW (ref 3.5–5.0)
Anion gap: 4 — ABNORMAL LOW (ref 5–15)
BUN: 15 mg/dL (ref 6–20)
CO2: 25 mmol/L (ref 22–32)
Calcium: 8.1 mg/dL — ABNORMAL LOW (ref 8.9–10.3)
Chloride: 111 mmol/L (ref 98–111)
Creatinine, Ser: 0.57 mg/dL (ref 0.44–1.00)
GFR calc Af Amer: >60 mL/min (ref >60)
GFR calc non Af Amer: >60 mL/min (ref >60)
Glucose, Bld: 84 mg/dL (ref 70–99)
Potassium: 3.3 mmol/L — ABNORMAL LOW (ref 3.5–5.1)
Sodium: 140 mmol/L (ref 135–145)
Total Bilirubin: 0.8 mg/dL (ref 0.3–1.2)
Total Protein: 5.8 g/dL — ABNORMAL LOW (ref 6.5–8.1)

## 2018-07-20 LAB — CBC
HCT: 32.5 % — ABNORMAL LOW (ref 36.0–46.0)
Hemoglobin: 10.2 g/dL — ABNORMAL LOW (ref 12.0–15.0)
MCH: 26.6 pg (ref 26.0–34.0)
MCHC: 31.4 g/dL (ref 30.0–36.0)
MCV: 84.9 fL (ref 80.0–100.0)
Platelets: 176 10*3/uL (ref 150–400)
RBC: 3.83 MIL/uL — ABNORMAL LOW (ref 3.87–5.11)
RDW: 13.2 % (ref 11.5–15.5)
WBC: 4.5 10*3/uL (ref 4.0–10.5)
nRBC: 0 % (ref 0.0–0.2)

## 2018-07-20 LAB — URINE CULTURE: Culture: NO GROWTH

## 2018-07-20 MED ORDER — POTASSIUM CHLORIDE CRYS ER 20 MEQ PO TBCR
40.0000 meq | EXTENDED_RELEASE_TABLET | Freq: Once | ORAL | Status: AC
  Administered 2018-07-20: 40 meq via ORAL
  Filled 2018-07-20: qty 2

## 2018-07-20 MED ORDER — SENNOSIDES-DOCUSATE SODIUM 8.6-50 MG PO TABS: 2.0000 | ORAL_TABLET | Freq: Every evening | ORAL | 1 refills | Status: DC | PRN

## 2018-07-20 MED ORDER — POLYETHYLENE GLYCOL 3350 17 G PO PACK: 17.0000 g | PACK | Freq: Every day | ORAL | 0 refills | Status: DC

## 2018-07-20 NOTE — Discharge Instructions (Signed)
Follow-up with primary care physician in 3 to 5 days Follow-up with OB/GYN for postpartum care as recommended

## 2018-07-20 NOTE — Discharge Summary (Signed)
[facility] - [place] at [facility]   PATIENT NAME: Hannah Hancock    MR#:  [id number]  DATE OF BIRTH:  05-21-1996  DATE OF ADMISSION:  07/19/2018 ADMITTING PHYSICIAN: Ramonita Lab, MD  DATE OF DISCHARGE:  07/20/18 PRIMARY CARE PHYSICIAN: Leotis Shames, MD    ADMISSION DIAGNOSIS:  Vomiting  DISCHARGE DIAGNOSIS:   Intractable nausea vomiting Urinary tract infection ruled out with negative urine culture  SECONDARY DIAGNOSIS:   Past Medical History:  Diagnosis Date  . Abdominal pain, recurrent   . 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 Anxiety   . Asthma    no meds, no recent attacks  . Gallstones   . Tendinitis    R hip  . Urinary tract infection    Last one 4 months ago  . Weight loss     HOSPITAL COURSE:   HPI  Hannah Hancock  is a 22 y.o. female with a known history of anxiety, ADHD, and nursing 21-month-old baby is presenting to the ED with a chief complaint of intractable nausea and vomiting which was started after Thanksgiving dinner yesterday night.  Patient is having epigastric abdominal pain came into the emergency department CT abdomen has revealed significant constipation with stool burden and cholecystectomy but no other abnormalities.  Patient was given Benadryl IV and Zofran IV in the emergency department with fluid boluses.  Patient was completely lethargic during my examination and history obtained from patient's mom at bedside  #Acute gastritis intractable nausea and vomiting-probably viral Clinically improved and tolerating advanced diet Hydrated with IV fluids, antiemetics for symptomatic treatment Pepcid IV given  Patient is status post cholecystectomy, labs look unremarkable Will discharge home  #Acute cystitis Urine looks abnormal urine culture with no growth.  Will discontinue IV Rocephin Provided IV fluids   #Significant constipation Patient was on MiraLAX and Senokot in the past which were stopped by the  patient.    Resume the same for good bowel movement and recommended patient to continue them in future.  Patient had a large bowel movement after MiraLAX and Senokot were resumed   # leukocytosis can be  Reactive Urine culture is negative  #ADHD/anxiety Continue home medication Zoloft and outpatient follow-up with the psychiatry  DISCHARGE CONDITIONS:   stable  CONSULTS OBTAINED:     PROCEDURES  NONE   DRUG ALLERGIES:  No Known Allergies  DISCHARGE MEDICATIONS:   Allergies as of 07/20/2018   No Known Allergies     Medication List    STOP taking these medications   HYDROcodone-acetaminophen 5-325 MG tablet Commonly known as:  NORCO/VICODIN   ondansetron 4 MG disintegrating tablet Commonly known as:  ZOFRAN-ODT     TAKE these medications   alum & mag hydroxide-simeth 400-400-40 MG/5ML suspension Commonly known as:  MAALOX PLUS Take 5 mLs by mouth every 6 (six) hours as needed for indigestion.   famotidine 20 MG tablet Commonly known as:  PEPCID Take 1 tablet (20 mg total) by mouth 2 (two) times daily.   multivitamin tablet Take 1 tablet by mouth daily.   polyethylene glycol packet Commonly known as:  MIRALAX / GLYCOLAX Take 17 g by mouth daily. What changed:    when to take this  reasons to take this   PRENATAL VITAMIN PO Take 1 tablet by mouth daily.   senna-docusate 8.6-50 MG tablet Commonly known as:  Senokot-S Take 2 tablets by mouth at bedtime as needed for mild constipation or moderate constipation. What changed:    when  to take this  reasons to take this   sertraline 25 MG tablet Commonly known as:  ZOLOFT Take 25 mg by mouth daily.        DISCHARGE INSTRUCTIONS:   Follow-up with primary care physician in 3 to 5 days Follow-up with OB/GYN for postpartum care as recommended  DIET:  Regular diet  DISCHARGE CONDITION:  Stable  ACTIVITY:  Activity as tolerated  OXYGEN:  Home Oxygen: No.   Oxygen Delivery: room  air  DISCHARGE LOCATION:  home   If you experience worsening of your admission symptoms, develop shortness of breath, life threatening emergency, suicidal or homicidal thoughts you must seek medical attention immediately by calling 911 or calling your MD immediately  if symptoms less severe.  You Must read complete instructions/literature along with all the possible adverse reactions/side effects for all the Medicines you take and that have been prescribed to you. Take any new Medicines after you have completely understood and accpet all the possible adverse reactions/side effects.   Please note  You were cared for by a hospitalist during your hospital stay. If you have any questions about your discharge medications or the care you received while you were in the hospital after you are discharged, you can call the unit and asked to speak with the hospitalist on call if the hospitalist that took care of you is not available. Once you are discharged, your primary care physician will handle any further medical issues. Please note that NO REFILLS for any discharge medications will be authorized once you are discharged, as it is imperative that you return to your primary care physician (or establish a relationship with a primary care physician if you do not have one) for your aftercare needs so that they can reassess your need for medications and monitor your lab values.     Today  Chief Complaint  Patient presents with  . Abdominal Pain  . Emesis   Patient is doing much better nausea vomiting completely resolved.  Tolerating advanced diet.  Denies any pain wants to go home Urine culture negative IV antibiotics discontinued  ROS:  CONSTITUTIONAL: Denies fevers, chills. Denies any fatigue, weakness.  EYES: Denies blurry vision, double vision, eye pain. EARS, NOSE, THROAT: Denies tinnitus, ear pain, hearing loss. RESPIRATORY: Denies cough, wheeze, shortness of breath.  CARDIOVASCULAR: Denies  chest pain, palpitations, edema.  GASTROINTESTINAL: Denies nausea, vomiting, diarrhea, abdominal pain. Denies bright red blood per rectum. GENITOURINARY: Denies dysuria, hematuria. ENDOCRINE: Denies nocturia or thyroid problems. HEMATOLOGIC AND LYMPHATIC: Denies easy bruising or bleeding. SKIN: Denies rash or lesion. MUSCULOSKELETAL: Denies pain in neck, back, shoulder, knees, hips or arthritic symptoms.  NEUROLOGIC: Denies paralysis, paresthesias.  PSYCHIATRIC: Denies anxiety or depressive symptoms.   VITAL SIGNS:  Blood pressure 97/60, pulse 76, temperature 98.6 F (37 C), resp. rate 17, height 5\' 3"  (1.6 m), weight 71.8 kg, last menstrual period 06/25/2018, SpO2 96 %, currently breastfeeding.  I/O:    Intake/Output Summary (Last 24 hours) at 07/20/2018 1250 Last data filed at 07/20/2018 0500 Gross per 24 hour  Intake 1250.13 ml  Output 75 ml  Net 1175.13 ml    PHYSICAL EXAMINATION:  GENERAL:  22 y.o.-year-old patient lying in the bed with no acute distress.  EYES: Pupils equal, round, reactive to light and accommodation. No scleral icterus. Extraocular muscles intact.  HEENT: Head atraumatic, normocephalic. Oropharynx and nasopharynx clear.  NECK:  Supple, no jugular venous distention. No thyroid enlargement, no tenderness.  LUNGS: Normal breath sounds bilaterally, no wheezing,  rales,rhonchi or crepitation. No use of accessory muscles of respiration.  CARDIOVASCULAR: S1, S2 normal. No murmurs, rubs, or gallops.  ABDOMEN: Soft, non-tender, non-distended. Bowel sounds present. No organomegaly or mass.  EXTREMITIES: No pedal edema, cyanosis, or clubbing.  NEUROLOGIC: Cranial nerves II through XII are intact. Muscle strength 5/5 in all extremities. Sensation intact. Gait not checked.  PSYCHIATRIC: The patient is alert and oriented x 3.  SKIN: No obvious rash, lesion, or ulcer.   DATA REVIEW:   CBC Recent Labs  Lab 07/20/18 0452  WBC 4.5  HGB 10.2*  HCT 32.5*  PLT 176     Chemistries  Recent Labs  Lab 07/20/18 0452  NA 140  K 3.3*  CL 111  CO2 25  GLUCOSE 84  BUN 15  CREATININE 0.57  CALCIUM 8.1*  AST 14*  ALT 14  ALKPHOS 87  BILITOT 0.8    Cardiac Enzymes No results for input(s): TROPONINI in the last 168 hours.  Microbiology Results  Results for orders placed or performed during the hospital encounter of 07/19/18  Urine culture     Status: None   Collection Time: 07/19/18  4:00 AM  Result Value Ref Range Status   Specimen Description   Final    URINE, RANDOM Performed at [facility], [address], [place], [name] [address]    Special Requests   Final    NONE Performed at [facility], [address] [place], [name] [address]    Culture   Final    NO GROWTH Performed at [facility], [address], [place], [name] [address]    Report Status 07/20/2018 FINAL  Final    RADIOLOGY:  Ct Abdomen Pelvis W Contrast  Result Date: 07/19/2018 CLINICAL DATA:  Initial evaluation for acute abdominal pain, appendicitis suspected. EXAM: CT ABDOMEN AND PELVIS WITH CONTRAST TECHNIQUE: Multidetector CT imaging of the abdomen and pelvis was performed using the standard protocol following bolus administration of intravenous contrast. CONTRAST:  ISOVUE-300 IOPAMIDOL (ISOVUE-300) INJECTION 61% COMPARISON:  Prior CT from 11/01/2017 FINDINGS: Lower chest: Mild subsegmental atelectatic changes seen dependently within the visualized lung bases. Visualized lungs are otherwise clear. Hepatobiliary: Liver demonstrates a normal contrast enhanced appearance. Focal fat deposition noted adjacent to the falciform ligament. Gallbladder surgically absent. No biliary dilatation. Pancreas: Pancreas within normal limits without acute inflammatory changes. Spleen: Spleen within normal limits. Adrenals/Urinary Tract: Adrenal glands are normal. Kidneys equal size with symmetric enhancement. No nephrolithiasis, hydronephrosis, or focal  enhancing renal mass. No hydroureter. Partially distended bladder within normal limits. Stomach/Bowel: Stomach within normal limits. No evidence for bowel obstruction. Appendix well visualized within the right lower quadrant and is normal in appearance without inflammatory changes to suggest acute appendicitis. No other acute inflammatory changes seen elsewhere about the bowels. Large volume retained stool within the rectosigmoid colon, suggesting constipation. Vascular/Lymphatic: Normal intravascular enhancement seen throughout the intra-abdominal aorta and its branch vessels. No adenopathy. Reproductive: Uterus and ovaries within normal limits. Other: No free air or fluid. Tiny fat containing paraumbilical hernia noted without associated inflammation. Musculoskeletal: No acute osseous abnormality. No discrete lytic or blastic osseous lesions. IMPRESSION: 1. No CT evidence for acute intra-abdominal or pelvic process. Normal appendix. 2. Large volume retained stool within the rectosigmoid colon, suggesting constipation. 3. Status post cholecystectomy. Electronically Signed   By: Rise Mu M.D.   On: 07/19/2018 06:03    EKG:   Orders placed or performed during the hospital encounter of 11/01/17  . ED EKG  .  ED EKG  . EKG 12-Lead  . EKG 12-Lead  . EKG      Management plans discussed with the patient, family and they are in agreement.  CODE STATUS:     Code Status Orders  (From admission, onward)         Start     Ordered   07/19/18 0917  Full code  Continuous     07/19/18 0916        Code Status History    Date Active Date Inactive Code Status Order ID Comments User Context   11/01/2017 1955 11/04/2017 1541 Full Code [id number]  Milagros LollSudini, Srikar, MD ED   10/04/2017 2307 10/06/2017 2010 Full Code [id number]  Genia DelHaviland, Margaret, CNM Inpatient   10/03/2017 1539 10/04/2017 2307 Full Code [id number]  Randa NgoMcVey, Rebecca A, CNM Inpatient   10/03/2017 1438 10/03/2017 1539 Full Code [id number]   Randa NgoMcVey, Rebecca A, CNM Inpatient   06/28/2017 0203 06/28/2017 0623 Full Code [id number]  Sharee PimpleJones, Caron W, CNM Inpatient      TOTAL TIME TAKING CARE OF THIS PATIENT: 42minutes.   Note: This dictation was prepared with Dragon dictation along with smaller phrase technology. Any transcriptional errors that result from this process are unintentional.   @[facility] @  on 07/20/2018 at 12:50 PM  Between 7am to 6pm - Pager - [phone]  After 6pm go to [email or web address] - password [email or web address]  [facility]  Office  [phone]  CC: Primary care physician; Leotis ShamesSingh, Jasmine, MD

## 2018-07-20 NOTE — Progress Notes (Signed)
Patient discharged home per MD order. Prescription given to patient. All discharge instructions given and all questions answered. 

## 2018-07-22 ENCOUNTER — Telehealth: Payer: Self-pay

## 2018-07-22 LAB — HIV ANTIBODY (ROUTINE TESTING W REFLEX): HIV Screen 4th Generation wRfx: NONREACTIVE

## 2018-07-22 NOTE — Telephone Encounter (Signed)
EMMI Follow-up: Ms. Hannah Hancock called as she had received a couple of automated calls and checking to see why we called.  I explained our process and let her know there would be another automated call in a day or two with a different series of questions and to let us know at that time if she had any concerns.  No needs noted for today.

## 2018-07-24 ENCOUNTER — Ambulatory Visit: Payer: BLUE CROSS/BLUE SHIELD | Attending: Certified Nurse Midwife

## 2018-07-24 DIAGNOSIS — K5902 Outlet dysfunction constipation: Secondary | ICD-10-CM | POA: Diagnosis present

## 2018-07-24 DIAGNOSIS — M545 Low back pain: Secondary | ICD-10-CM | POA: Insufficient documentation

## 2018-07-24 DIAGNOSIS — M6208 Separation of muscle (nontraumatic), other site: Secondary | ICD-10-CM | POA: Diagnosis present

## 2018-07-24 DIAGNOSIS — G8929 Other chronic pain: Secondary | ICD-10-CM | POA: Insufficient documentation

## 2018-07-24 DIAGNOSIS — M62838 Other muscle spasm: Secondary | ICD-10-CM | POA: Insufficient documentation

## 2018-07-24 NOTE — Therapy (Signed)
[facility] [address] [place], [place], [address] Phone: [phone]   Fax:  [phone]  Physical Therapy Treatment  Patient Details  Name: Hannah Hancock MRN: [id number] Date of Birth: 06/19/1996 Referring Provider (PT): Haviland, Margaret   Encounter Date: 07/24/2018  PT End of Session - 07/24/18 1632    Visit Number  21    Number of Visits  25    Date for PT Re-Evaluation  08/14/18    Authorization Type  Medicaid    Authorization Time Period  through 12-8    Authorization - Visit Number  4    Authorization - Number of Visits  7    PT Start Time  1435    PT Stop Time  1535    PT Time Calculation (min)  60 min    Activity Tolerance  Patient tolerated treatment well    Behavior During Therapy  WFL for tasks assessed/performed       Past Medical History:  Diagnosis Date  . Abdominal pain, recurrent   . 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 Anxiety   . Asthma    no meds, no recent attacks  . Gallstones   . Tendinitis    R hip  . Urinary tract infection    Last one 4 months ago  . Weight loss     Past Surgical History:  Procedure Laterality Date  . ADENOIDECTOMY    . CHOLECYSTECTOMY N/A 11/03/2017   Procedure: LAPAROSCOPIC CHOLECYSTECTOMY;  Surgeon: Cintron-Diaz, Edgardo, MD;  Location: [facility] ORS;  Service: General;  Laterality: N/A;  . ENDOSCOPIC RETROGRADE CHOLANGIOPANCREATOGRAPHY (ERCP) WITH PROPOFOL N/A 11/02/2017   Procedure: ENDOSCOPIC RETROGRADE CHOLANGIOPANCREATOGRAPHY (ERCP) WITH PROPOFOL;  Surgeon: Wohl, Darren, MD;  Location: [facility] ENDOSCOPY;  Service: Endoscopy;  Laterality: N/A;  . ESOPHAGOGASTRODUODENOSCOPY  10/20/2011   Procedure: ESOPHAGOGASTRODUODENOSCOPY (EGD);  Surgeon: Joseph H. Clark, MD;  Location: [facility] OR;  Service: Gastroenterology;  Laterality: N/A;    There were no vitals filed for this visit.    Pelvic Floor Physical Therapy Treatment Note  SCREENING  Changes in  medications, allergies, or medical history?: Was admitted to the hospital for the Flu.   SUBJECTIVE  Patient reports: No leakage over the past week, a little back pain on R side starting Monday. She was admitted to the hospital for the Flu on Thanksgiving and was out on Saturday. Was throwing up everything.   Precautions:  breastfeeding  Pain update:  Location of pain: R low back Current pain:  1/10  Max pain:  3/10 Least pain:  0/10 Nature of pain:   Patient Goals: Decrease Urinary leakage, back pain,a and improve relaxation for intercourse.    OBJECTIVE  Changes in: Posture/Observations:  Minor anterior rotation of R innominate pre-treatment, improved following treatment.  Range of Motion/Flexibilty:  Decreased mobility and pain with pressure through L>R sacral border.  Palpation: TTP to B lumbar multifidus and L lumbar paraspinals   INTERVENTIONS THIS SESSION: Manual: Performed grade 3-4 PA mobs to L>R sacral borders to improve mobility and decrease pain and pressure on sacral nerve roots for less spasm in PFM and pain with intercourse. Performed TP release to B lumbar multifidus and L lumbar paraspinals  to decrease liklihood of spasm recurrence and pain with intercourse. Dry-needle: Performed TPDN with .30x75mm needle to B lumbar multifidus and L lumbar paraspinals to decrease pain and spasm and allow for improved recruitment of deep-core to decrease liklihood of spasm recurrence and pain with intercourse.    Total time: 60 min.                          PT Education - 07/24/18 1631    Education provided  Yes    Education Details  Reminded Pt. of body mechanics for baby carrying to prevent recurrence of spasms and pain.    Person(s) Educated  Patient    Methods  Explanation    Comprehension  Verbalized understanding       PT Short Term Goals - 07/24/18 1807      PT SHORT TERM GOAL #1   Title  Patient will demonstrate a coordinated  contraction, relaxation, and bulge of the pelvic floor muscles to demonstrate functional recruitment and motion and allow for further strengthening.    Time  6    Period  Weeks    Status  Achieved    Target Date  01/10/18      PT SHORT TERM GOAL #2   Title  Patient will demonstrate improved pelvic alignment and balance of musculature surrounding the pelvis to facilitate decreased PFM spasms and decrease pelvic pain.    Time  3    Period  Weeks    Status  Achieved    Target Date  12/20/17      PT SHORT TERM GOAL #3   Title  Patient will report consistent use of foot-stool (squatty-potty) for positioning with BM to decrease pain with BM and intra-abdominal pressure.    Baseline  Pt. did not remember education on stool so it was reviewed this session. Patient has decided not to buy stool due to expense and improvement of BM ease.    Time  3    Period  Weeks    Status  Not Met    Target Date  03/27/18      PT SHORT TERM GOAL #4   Title  Patient will demonstrate HEP x1 in the clinic to demonstrate understanding and proper form to allow for further improvement.    Time  3    Period  Weeks    Status  Achieved    Target Date  12/20/17        PT Long Term Goals - 07/24/18 1808      PT LONG TERM GOAL #1   Title  Patient will report no episodes of SUI over the course of the prior two weeks to demonstrate improved functional ability.    Time  12    Period  Weeks    Status  Achieved    Target Date  05/01/18      PT LONG TERM GOAL #2   Title  Patient will score at or below 2/300 on the PFDI and 0/300 on the PFIQ to demonstrate a clinically meaningful decrease in disability and distress due to pelvic floor dysfunction.    Baseline  PFDI:47/300, PFIQ: 22/300, as of 02/20/18: PFDI: 31/300, PFIQ: 24/300, As of 05/22/2018: PFDI: 6/300, PFIQ: 5/300.    Time  12    Period  Weeks    Status  On-going    Target Date  07/17/18      PT LONG TERM GOAL #3   Title  Patient will report having BM's at  least every-other day with consistency between [facility] stool scale 3-5 over the prior week to demonstrate decreased constipation.    Time  12    Period  Weeks    Status  Achieved    Target Date  05/01/18  PT LONG TERM GOAL #4   Title  Patient will describe ability to have intercourse without vaginismus/penetration difficulty to demonstrate improved PFM relaxation and improved QOL.    Baseline  As of 10/2: Has not tried intercourse but continues to have pain with pressure to posterior fourchette. As of 12/4: Has tried intercourse once and it was less painful but countinues to be painful enough to discourage the act.    Time  12    Period  Weeks    Status  On-going    Target Date  08/21/18      PT LONG TERM GOAL #5   Title  Patient will be able to stand or walf for greater than 2 hours without an increase in LBP greater than 2/10 to demonstrate improved quality of life and functional ability.    Baseline  1 hour of standing or walking increases pain to highest level (5/10) As of 02/20/18: Pain s now a 4 at worst when moving boxes etc.     Time  12    Status  Achieved    Target Date  05/01/18            Plan - 07/24/18 1811    Clinical Impression Statement  Pt. has maintained improvement in UI over the past 3 weeks now but has had intermittent back pain flare-ups and countinues to have pain with intercourse. She has responded well to all interventions and continues to demonstrate improvement, regardless of set-backs like her recent hospitilization but due to scheduling conflicts has only been able to attend 4 of the 7 planned visits and will benefit from continued therapy to focus on resolution of vaginal pain with penetration for intercourse and gynecological visits. She responded well to today's visit but her recent sickness and hospitilization had aggrivated her back pain and while this pain was resolved by end of sesson, we did not have time to further address her PFM spasms and scar  tissue as planned. She will benefit from 3 more visits to continue to work toward this goal and finish up the requested amount of visits from prior approval.     Clinical Presentation  Stable    Clinical Decision Making  Moderate    Rehab Potential  Good    Clinical Impairments Affecting Rehab Potential  Grade 3 tearing with delivery, diastasis recti, recent abdominal surgery    PT Frequency  1x / week    PT Duration  3 weeks    PT Treatment/Interventions  Biofeedback;Electrical Stimulation;Traction;Moist Heat;Functional mobility training;Therapeutic activities;Therapeutic exercise;Patient/family education;Neuromuscular re-education;Manual techniques;Scar mobilization;Energy conservation;Dry needling;Passive range of motion    PT Next Visit Plan  Further fascial mobility around perenium using MWM, re-check Pubic symphysis alignment, consider rectal work for scar. give information on how to progress Dilators for further home treatment?    PT Home Exercise Plan  Child's pose, diastasis approximation, 3-way wall stretch and pelvic tilts in seated, piriformis stretch, side-stretch, TA in quadruped, bird-dog, [id number], lengthening kegels      Consulted and Agree with Plan of Care  Patient       Patient will benefit from skilled therapeutic intervention in order to improve the following deficits and impairments:  Decreased skin integrity, Increased fascial restricitons, Impaired sensation, Improper body mechanics, Pain, Decreased coordination, Decreased scar mobility, Increased muscle spasms, Impaired tone, Postural dysfunction, Decreased activity tolerance, Decreased range of motion, Decreased strength, Decreased balance, Difficulty walking  Visit Diagnosis: Other muscle spasm  Chronic midline low back pain without sciatica  Constipation  by outlet dysfunction  Diastasis recti     Problem List Patient Active Problem List   Diagnosis Date Noted  . UTI (urinary tract infection) 07/19/2018  .  Common bile duct stone   . Abnormal liver function tests   . Abnormal magnetic resonance imaging of liver   . Transaminitis 11/01/2017  . Third degree laceration of perineum during delivery, postpartum 10/06/2017  . Gestational hypertension 10/03/2017  . Abdominal pain affecting pregnancy 06/28/2017  . 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 Presence of subdermal contraceptive device 04/07/2014  . Sleep disturbance 11/03/2013  . Adjustment disorder with mixed anxiety and depressed mood 08/12/2013   Willa Rough DPT, ATC Willa Rough 07/24/2018, 6:19 PM  [place] [facility] [address] [place], [place], [address] Phone: [phone]   Fax:  [phone]  Name: Hannah Hancock MRN: [id number] Date of Birth: [birth year]

## 2019-04-23 ENCOUNTER — Emergency Department: Payer: BC Managed Care – PPO

## 2019-04-23 ENCOUNTER — Other Ambulatory Visit: Payer: Self-pay

## 2019-04-23 ENCOUNTER — Emergency Department
Admission: EM | Admit: 2019-04-23 | Discharge: 2019-04-23 | Disposition: A | Discharge status: Home / Self Care | Payer: BC Managed Care – PPO | Attending: Emergency Medicine | Admitting: Emergency Medicine

## 2019-04-23 ENCOUNTER — Encounter: Payer: Self-pay | Admitting: Emergency Medicine

## 2019-04-23 DIAGNOSIS — Z79899 Other long term (current) drug therapy: Secondary | ICD-10-CM | POA: Insufficient documentation

## 2019-04-23 DIAGNOSIS — Z87891 Personal history of nicotine dependence: Secondary | ICD-10-CM | POA: Diagnosis not present

## 2019-04-23 DIAGNOSIS — R1084 Generalized abdominal pain: Secondary | ICD-10-CM | POA: Diagnosis present

## 2019-04-23 DIAGNOSIS — J45909 Unspecified asthma, uncomplicated: Secondary | ICD-10-CM | POA: Insufficient documentation

## 2019-04-23 DIAGNOSIS — N939 Abnormal uterine and vaginal bleeding, unspecified: Secondary | ICD-10-CM | POA: Diagnosis not present

## 2019-04-23 LAB — COMPREHENSIVE METABOLIC PANEL
ALT: 12 U/L (ref 0–44)
AST: 14 U/L — ABNORMAL LOW (ref 15–41)
Albumin: 3.9 g/dL (ref 3.5–5.0)
Alkaline Phosphatase: 80 U/L (ref 38–126)
Anion gap: 10 (ref 5–15)
BUN: 14 mg/dL (ref 6–20)
CO2: 22 mmol/L (ref 22–32)
Calcium: 9.5 mg/dL (ref 8.9–10.3)
Chloride: 106 mmol/L (ref 98–111)
Creatinine, Ser: 0.51 mg/dL (ref 0.44–1.00)
GFR calc Af Amer: >60 mL/min (ref >60)
GFR calc non Af Amer: >60 mL/min (ref >60)
Glucose, Bld: 97 mg/dL (ref 70–99)
Potassium: 3.6 mmol/L (ref 3.5–5.1)
Sodium: 138 mmol/L (ref 135–145)
Total Bilirubin: 0.2 mg/dL — ABNORMAL LOW (ref 0.3–1.2)
Total Protein: 7.6 g/dL (ref 6.5–8.1)

## 2019-04-23 LAB — URINALYSIS, COMPLETE (UACMP) WITH MICROSCOPIC
Bilirubin Urine: NEGATIVE
Glucose, UA: NEGATIVE mg/dL
Ketones, ur: NEGATIVE mg/dL
Nitrite: NEGATIVE
Protein, ur: NEGATIVE mg/dL
Specific Gravity, Urine: 1.015 (ref 1.005–1.030)
pH: 6 (ref 5.0–8.0)

## 2019-04-23 LAB — CBC
HCT: 37.7 % (ref 36.0–46.0)
Hemoglobin: 12.1 g/dL (ref 12.0–15.0)
MCH: 25.5 pg — ABNORMAL LOW (ref 26.0–34.0)
MCHC: 32.1 g/dL (ref 30.0–36.0)
MCV: 79.4 fL — ABNORMAL LOW (ref 80.0–100.0)
Platelets: 349 10*3/uL (ref 150–400)
RBC: 4.75 MIL/uL (ref 3.87–5.11)
RDW: 14.1 % (ref 11.5–15.5)
WBC: 10.5 10*3/uL (ref 4.0–10.5)
nRBC: 0 % (ref 0.0–0.2)

## 2019-04-23 LAB — LIPASE, BLOOD: Lipase: 46 U/L (ref 11–51)

## 2019-04-23 LAB — HCG, QUANTITATIVE, PREGNANCY: hCG, Beta Chain, Quant, S: <1 m[IU]/mL (ref <5)

## 2019-04-23 LAB — POCT PREGNANCY, URINE: Preg Test, Ur: NEGATIVE

## 2019-04-23 MED ORDER — SODIUM CHLORIDE 0.9% FLUSH: 3.0000 mL | Freq: Once | INTRAVENOUS | Status: DC

## 2019-04-23 NOTE — ED Triage Notes (Signed)
Pt c/o abdominal cramping and HA since Monday. Pt stated she passed tissue, clot like substance.  Pt denies NVD.

## 2019-04-23 NOTE — ED Notes (Signed)
Pt reports abd cramping since Monday with worsening symptoms today. She is on her cycle and reports she felt like she passed a clot but when she changed her pad it was a "piece of skin tissue" not a clot. A&Ox4, NAD at this time

## 2019-04-23 NOTE — ED Provider Notes (Signed)
[facility] Emergency Department Provider Note  Time seen: 8:11 PM  I have reviewed the triage vital signs and the nursing notes.   HISTORY  Chief Complaint Abdominal Pain   HPI Hannah Hancock is a 23 y.o. female with a past medical history of ADHD, presents to the emergency department for lower abdominal cramping and passage of tissue.  According to the patient her last menstrual period was in July, she started with light vaginal spotting 3 days ago, states continued very light spotting but had lower abdominal cramping which she describes as severe earlier today followed by what she thought was a clot however upon closer inspection she noted it to appear to be tissue which she brought to the emergency department.  Patient did not take a pregnancy test during August.  Denies any fever cough congestion or shortness of breath.   Past Medical History:  Diagnosis Date  . Abdominal pain, recurrent   . 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 Anxiety   . Asthma    no meds, no recent attacks  . Gallstones   . Tendinitis    R hip  . Urinary tract infection    Last one 4 months ago  . Weight loss     Patient Active Problem List   Diagnosis Date Noted  . UTI (urinary tract infection) 07/19/2018  . Common bile duct stone   . Abnormal liver function tests   . Abnormal magnetic resonance imaging of liver   . Transaminitis 11/01/2017  . Third degree laceration of perineum during delivery, postpartum 10/06/2017  . Gestational hypertension 10/03/2017  . Abdominal pain affecting pregnancy 06/28/2017  . 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 Presence of subdermal contraceptive device 04/07/2014  . Sleep disturbance 11/03/2013  . Adjustment disorder with mixed anxiety and depressed mood 08/12/2013    Past Surgical History:  Procedure Laterality Date  . ADENOIDECTOMY    . CHOLECYSTECTOMY N/A 11/03/2017   Procedure: LAPAROSCOPIC  CHOLECYSTECTOMY;  Surgeon: Carolan Shiver, MD;  Location: [facility] ORS;  Service: General;  Laterality: N/A;  . ENDOSCOPIC RETROGRADE CHOLANGIOPANCREATOGRAPHY (ERCP) WITH PROPOFOL N/A 11/02/2017   Procedure: ENDOSCOPIC RETROGRADE CHOLANGIOPANCREATOGRAPHY (ERCP) WITH PROPOFOL;  Surgeon: Midge Minium, MD;  Location: [facility] ENDOSCOPY;  Service: Endoscopy;  Laterality: N/A;  . ESOPHAGOGASTRODUODENOSCOPY  10/20/2011   Procedure: ESOPHAGOGASTRODUODENOSCOPY (EGD);  Surgeon: Jon Gills, MD;  Location: [facility] OR;  Service: Gastroenterology;  Laterality: N/A;    Prior to Admission medications   Medication Sig Start Date End Date Taking? Authorizing Provider  alum & mag hydroxide-simeth (MAALOX MAX) 400-400-40 MG/5ML suspension Take 5 mLs by mouth every 6 (six) hours as needed for indigestion. Patient not taking: Reported on 11/28/2017 11/01/17   Nita Sickle, MD  famotidine (PEPCID) 20 MG tablet Take 1 tablet (20 mg total) by mouth 2 (two) times daily. Patient not taking: Reported on 11/28/2017 11/01/17 11/01/18  Nita Sickle, MD  Multiple Vitamin (MULTIVITAMIN) tablet Take 1 tablet by mouth daily.    [provider]  polyethylene glycol (MIRALAX / GLYCOLAX) packet Take 17 g by mouth daily. 07/20/18   Ramonita Lab, MD  Prenatal Vit-Fe Fumarate-FA (PRENATAL VITAMIN PO) Take 1 tablet by mouth daily.    [provider]  senna-docusate (SENOKOT-S) 8.6-50 MG tablet Take 2 tablets by mouth at bedtime as needed for mild constipation or moderate constipation. 07/20/18   Ramonita Lab, MD  sertraline (ZOLOFT) 25 MG tablet Take 25 mg by mouth daily. 10/24/17   [provider]  No Known Allergies  Family History  Problem Relation Age of Onset  . Cholelithiasis Mother   . Miscarriages / [place] Mother   . Hypertension Maternal Grandmother   . Kidney disease Maternal Grandmother   . Stroke Maternal Grandmother   . Diabetes Paternal Grandfather   . Hypertension Paternal  Grandfather     Social History Social History   Tobacco Use  . Smoking status: Former Smoker    Packs/day: 0.30    Years: 1.00    Pack years: 0.30    Types: Cigarettes    Quit date: 05/29/2015    Years since quitting: 3.9  . Smokeless tobacco: Never Used  Substance Use Topics  . Alcohol use: No    Alcohol/week: 0.0 standard drinks  . Drug use: No    Review of Systems Constitutional: Negative for fever. Cardiovascular: Negative for chest pain. Respiratory: Negative for shortness of breath. Gastrointestinal: Lower abdominal cramping earlier today which has since resolved. Genitourinary: Vaginal spotting x3 days with passage of tissue today. Musculoskeletal: Negative for musculoskeletal complaints Neurological: Negative for headache All other ROS negative  ____________________________________________   PHYSICAL EXAM:  VITAL SIGNS: ED Triage Vitals  Enc Vitals Group     BP 04/23/19 1743 138/88     Pulse Rate 04/23/19 1743 99     Resp 04/23/19 1743 16     Temp 04/23/19 1743 98.5 F (36.9 C)     Temp Source 04/23/19 1743 Oral     SpO2 04/23/19 1743 98 %     Weight 04/23/19 1744 160 lb (72.6 kg)     Height 04/23/19 1744 5\' 3"  (1.6 m)     Head Circumference --      Peak Flow --      Pain Score 04/23/19 1744 6     Pain Loc --      Pain Edu? --      Excl. in GC? --    Constitutional: Alert and oriented. Well appearing and in no distress. Eyes: Normal exam ENT      Head: Normocephalic and atraumatic      Mouth/Throat: Mucous membranes are moist. Cardiovascular: Normal rate, regular rhythm. No murmur Respiratory: Normal respiratory effort without tachypnea nor retractions. Breath sounds are clear  Gastrointestinal: Soft and nontender. No distention.  Musculoskeletal: Nontender with normal range of motion in all extremities Neurologic:  Normal speech and language. No gross focal neurologic deficits  Skin:  Skin is warm, dry and intact.  Psychiatric: Mood and affect  are normal.   ____________________________________________   RADIOLOGY  Ultrasound is normal/negative  ____________________________________________   INITIAL IMPRESSION / ASSESSMENT AND PLAN / ED COURSE  Pertinent labs & imaging results that were available during my care of the patient were reviewed by me and considered in my medical decision making (see chart for details).   Patient presents to the emergency department for lower abdominal cramping vaginal spotting and passage of tissue.  Differential would include menstrual cycle, miscarriage, threatened miscarriage, partial miscarriage.  I inspected the tissue present and it does appear to be consistent with possible amniotic sac.  We will send to pathology.  Lab work is largely within normal limits including a negative pregnancy test.  I have added on a quantitative test, we will perform an ultrasound as well.  Ultrasound is negative.  Quantitative beta-hCG is negative.  Specimen was sent to pathology.  We will discharge the patient home with PCP follow-up.  Hannah Hancock was evaluated in Emergency Department on 04/23/2019 for  the symptoms described in the history of present illness. She was evaluated in the context of the global COVID-19 pandemic, which necessitated consideration that the patient might be at risk for infection with the SARS-CoV-2 virus that causes COVID-19. Institutional protocols and algorithms that pertain to the evaluation of patients at risk for COVID-19 are in a state of rapid change based on information released by regulatory bodies including the CDC and federal and state organizations. These policies and algorithms were followed during the patient's care in the ED.  ____________________________________________   FINAL CLINICAL IMPRESSION(S) / ED DIAGNOSES  Vaginal bleeding   Harvest Dark, MD 04/23/19 2138

## 2019-04-23 NOTE — ED Notes (Signed)
Pt otf for imaging 

## 2019-04-25 LAB — SURGICAL PATHOLOGY

## 2019-07-14 ENCOUNTER — Other Ambulatory Visit: Payer: Self-pay

## 2019-07-14 ENCOUNTER — Encounter: Payer: Self-pay | Admitting: Physical Therapy

## 2019-07-14 ENCOUNTER — Ambulatory Visit: Payer: BC Managed Care – PPO | Attending: Obstetrics and Gynecology | Admitting: Physical Therapy

## 2019-07-14 DIAGNOSIS — G8929 Other chronic pain: Secondary | ICD-10-CM

## 2019-07-14 DIAGNOSIS — M545 Low back pain, unspecified: Secondary | ICD-10-CM

## 2019-07-14 DIAGNOSIS — M62838 Other muscle spasm: Secondary | ICD-10-CM

## 2019-07-14 DIAGNOSIS — R278 Other lack of coordination: Secondary | ICD-10-CM

## 2019-07-14 NOTE — Therapy (Signed)
[facility] [address] [place], [name], [address] Phone: [phone]   Fax:  [phone]  Physical Therapy Evaluation  Patient Details  Name: Hannah Hancock MRN: [id number] Date of Birth: Sep 12, 1995 Referring Provider (PT): McVey   Encounter Date: 07/14/2019  PT End of Session - 07/14/19 1916    Visit Number  1    Number of Visits  13    Date for PT Re-Evaluation  10/06/19    Authorization Type  IE 07/13/2020    PT Start Time  1500    PT Stop Time  1555    PT Time Calculation (min)  55 min    Activity Tolerance  Patient tolerated treatment well    Behavior During Therapy  [facility] for tasks assessed/performed       Past Medical History:  Diagnosis Date  . Abdominal pain, recurrent   . 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 Anxiety   . Asthma    no meds, no recent attacks  . Gallstones   . Tendinitis    R hip  . Urinary tract infection    Last one 4 months ago  . Weight loss     Past Surgical History:  Procedure Laterality Date  . ADENOIDECTOMY    . CHOLECYSTECTOMY N/A 11/03/2017   Procedure: LAPAROSCOPIC CHOLECYSTECTOMY;  Surgeon: Carolan Shiver, MD;  Location: [facility] ORS;  Service: General;  Laterality: N/A;  . ENDOSCOPIC RETROGRADE CHOLANGIOPANCREATOGRAPHY (ERCP) WITH PROPOFOL N/A 11/02/2017   Procedure: ENDOSCOPIC RETROGRADE CHOLANGIOPANCREATOGRAPHY (ERCP) WITH PROPOFOL;  Surgeon: Midge Minium, MD;  Location: [facility] ENDOSCOPY;  Service: Endoscopy;  Laterality: N/A;  . ESOPHAGOGASTRODUODENOSCOPY  10/20/2011   Procedure: ESOPHAGOGASTRODUODENOSCOPY (EGD);  Surgeon: Jon Gills, MD;  Location: [facility] OR;  Service: Gastroenterology;  Laterality: N/A;    There were no vitals filed for this visit.       [facility] PT Assessment - 07/14/19 0001      Assessment   Medical Diagnosis  Dyspareunia    Referring Provider (PT)  McVey    Hand Dominance  Right    Prior Therapy  Yes (2019)      Precautions    Precautions  None      Balance Screen   Has the patient fallen in the past 6 months  No       Pelvic Floor Physical Therapy Evaluation and Assessment  SCREENING Red Flags: None Have you had any night sweats? Unexplained weight loss? Saddle anesthesia? Unexplained changes in bowel or bladder habits?  SUBJECTIVE  Patient reports: Patient notes that she has pervasive pain in her pelvic region. Patient states that she has pain with sex, tampon use, vaginal ring contraceptive. Patient states that it's like her muscles never relax. Patient notes that she always has pain in her hips and lower back because of one leg being shorter than the other. She has trouble differentiating from this pain and her pelvic pain. Patient was in PFPT for her 3rd degree tear last year after her daughter's birth. Patient notes that internal release and dry needling were helpful. Patient notes that she attempted to do some internal release and was advised to purchase dilators, but was unable 2/2 to financial limitations. Patient is prone to UTI, yeast infections, and bacterial vaginosis.   Precautions:  None  Social/Family/Vocational History:   Patient returning to school [employer]) and has limited schedule.   Recent Procedures/Tests/Findings:  None reported  Obstetrical History: Vaginal delivery (P1;G1), 3rd degree tear.   Gynecological  History: Pain with speculum insertion.  Urinary History: Incontinence with coughing/laughing/sneezing (50%), standing after emptying (~100%), and some urgency (25%). Patient describes the leakage as small. Fluid: tea (30 oz) and coffee (6 oz), water (30 oz).   Gastrointestinal History: Every 3 day, (+) straining, Type 2-3 on [place] Stool Chart. Patient notes that she consumes fruits and vegetables. She is on a new diet which cycles high crab and low carb days.   Sexual activity/pain: Pain with initial penetration and deep thrusting.   Location of pain:  vaginal, hip, LBP Current pain:  4/10  Max pain:  8/10 Least pain:  4/10 Nature of pain: sharp  Patient Goals: "to not be in pain"   OBJECTIVE  Posture/Observations:  Sitting: forward shoulders, increased spinal flexion Standing: forward shoulders, increased spinal flexion, increased L lateral flexion.  Special tests:  (+) SLS R, (+) L flexion quadrant test, (+) R extension quadrant test, (+) R FABER   Range of Motion/Flexibilty:  Spine: WFL Hips: R hip < L, painful R hip flexion,    Strength/MMT:  LE MMT  LE MMT Left Right  Hip flex:  (L2) 5/5 4-/5  Hip ext: 4/5 4/5  Hip abd: 4/5 3+/5  Hip add: 5/5 4/5  Knee ext 5/5 3+/5  Knee flex 5/5 3+/5     Abdominal:  Palpation: no TTP over the abdominal area Diastasis: < 1 finger width throughout  Pelvic Floor External Exam: deferred 2/2 to time Introitus Appears:  Skin integrity:  Palpation: Cough: Prolapse visible?: Scar mobility:  Internal Vaginal Exam: deferred 2/2 to time and active yeast infection Strength (PERF):  Symmetry: Palpation: Prolapse:   Gait Analysis: No Gross abnormalities noted.   Pelvic Floor Outcome Measures: NIH-CPSI (Female): 23/43; Pain: 14, Urinary: 1, QOL: 8  ASSESSMENT Patient is a 23 year old presenting to clinic with chief complaints of pelvic pain. Upon examination, patient demonstrates deficits in pain, posture, RLE strength, R hip ROM, PFM spasm as evidenced by worst pain 8/10, increased L lateral spinal flexion, L leg discrepancy, gross RLE MMT 4-/5, painful R hip flexion> 100 degrees, (+) R FABER, and SUI. Patient's responses on NIH CPSI Female outcome measures (23/43; Pain: 14, Urinary: 1, QOL: 8) indicate moderate functional limitations/disability/distress. Patient's progress may be limited due to scheduling demands and report of more external locus of control belief systems; however, patient's history of success with PFPT is advantageous.  Patient will benefit from continued  skilled therapeutic intervention to address deficits in  pain, posture, RLE strength, R hip ROM, PFM spasm in order to increase function and improve overall QOL.     INTERVENTIONS THIS SESSION: Self-Care Patient educated on toileting posture and bowel retraining in order to better regulate bowel through behavioral changes.     Objective measurements completed on examination: See above findings.       PT Long Term Goals - 07/14/19 1914      PT LONG TERM GOAL #1   Title  Patient will demonstrate independence with HEP in order to maximize therapeutic gains and improve carryover from physical therapy sessions to ADLs in the home and community.    Baseline  IE: not demonstrated    Time  12    Period  Weeks    Status  New    Target Date  10/06/19      PT LONG TERM GOAL #2   Title  Patient will demonstrate independent and coordinated diaphragmatic breathing in supine with a 1:2 breathing pattern for improved down-regulation of  the nervous system and improved management of intra-abdominal pressures in order to increase function at home and in the community.    Baseline  IE: not demonstrated    Time  12    Period  Weeks    Status  New    Target Date  10/06/19      PT LONG TERM GOAL #3   Title  Patient will decrease worst pain as reported on NPRS by at least 2 points to demonstrate clinically significant reduction in pain in order to restore/improve function and overall QOL.    Baseline  IE: 8/10    Time  12    Period  Weeks    Status  New    Target Date  10/06/19      PT LONG TERM GOAL #4   Title  Patient will indicate at least a 7 point difference on the NIH-CPSI Female form to demonstrate clinically significant improvement in pain severity for improved overall QOL.    Baseline  IE: 23/43 Pain 14, Urinary 1, QOL 8    Time  12    Period  Weeks    Status  New    Target Date  10/06/19      PT LONG TERM GOAL #5   Title  Patient will demonstrate understanding of basic  self-management/down-regulation of the nervous system for persistent pain condition and stress as evidenced by diaphragmatic breathing without cueing, body scan/progressive relaxation meditation, and improved sleep hygiene including in order to transition to independent management of patient's chief complaint:pelvic pain.    Baseline  IE: not demonstrated    Time  12    Period  Weeks    Status  New    Target Date  10/06/19             Plan - 07/14/19 1910    Clinical Impression Statement  Patient is a 23 year old presenting to clinic with chief complaints of pelvic pain. Upon examination, patient demonstrates deficits in pain, posture, RLE strength, R hip ROM, PFM spasm as evidenced by worst pain 8/10, increased L lateral spinal flexion, L leg discrepancy, gross RLE MMT 4-/5, painful R hip flexion> 100 degrees, (+) R FABER, and SUI. Patient's responses on NIH CPSI Female outcome measures (23/43; Pain: 14, Urinary: 1, QOL: 8) indicate moderate functional limitations/disability/distress. Patient's progress may be limited due to scheduling demands and report of more external locus of control belief systems; however, patient's history of success with PFPT is advantageous.  Patient will benefit from continued skilled therapeutic intervention to address deficits in  pain, posture, RLE strength, R hip ROM, PFM spasm in order to increase function and improve overall QOL.    Personal Factors and Comorbidities  Age;Fitness;Past/Current Experience;Behavior Pattern;Social Background;Time since onset of injury/illness/exacerbation;Finances;Comorbidity 3+    Comorbidities  ADHD, anxiety, asthma    Examination-Activity Limitations  Transfers;Locomotion Level;Caring for Others;Sit;Sleep;Squat;Stairs;Stand;Toileting;Lift;Continence    Examination-Participation Restrictions  Cleaning;Meal Prep;Driving;Laundry;Yard Work;Shop;Community Activity;Personal Finances    Stability/Clinical Decision Making   Evolving/Moderate complexity    Clinical Decision Making  Moderate    Rehab Potential  Fair    PT Frequency  1x / week    PT Duration  12 weeks    PT Treatment/Interventions  ADLs/Self Care Home Management;Biofeedback;Cryotherapy;Electrical Stimulation;Moist Heat;Balance training;Therapeutic exercise;Therapeutic activities;Gait training;Stair training;Functional mobility training;Neuromuscular re-education;Patient/family education;Dry needling;Passive range of motion;Scar mobilization;Manual techniques;Taping;Spinal Manipulations;Joint Manipulations    PT Next Visit Plan  External PFM exam, PCST    PT Home Exercise Plan  bowel retraining  Consulted and Agree with Plan of Care  Patient       Patient will benefit from skilled therapeutic intervention in order to improve the following deficits and impairments:  Decreased coordination, Impaired tone, Pain, Improper body mechanics, Postural dysfunction, Decreased strength, Impaired flexibility, Increased muscle spasms, Decreased range of motion  Visit Diagnosis: Other muscle spasm  Chronic midline low back pain without sciatica  Other lack of coordination     Problem List Patient Active Problem List   Diagnosis Date Noted  . UTI (urinary tract infection) 07/19/2018  . Common bile duct stone   . Abnormal liver function tests   . Abnormal magnetic resonance imaging of liver   . Transaminitis 11/01/2017  . Third degree laceration of perineum during delivery, postpartum 10/06/2017  . Gestational hypertension 10/03/2017  . Abdominal pain affecting pregnancy 06/28/2017  . 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 Presence of subdermal contraceptive device 04/07/2014  . Sleep disturbance 11/03/2013  . Adjustment disorder with mixed anxiety and depressed mood 08/12/2013   Sheria Lang PT, DPT [id number]  07/14/2019, 7:17 PM  [place] [facility] [address] [place], [name],  [address] Phone: [phone]   Fax:  [phone]  Name: KALEENA CORROW MRN: [id number] Date of Birth: [birth year]

## 2019-07-22 ENCOUNTER — Ambulatory Visit: Payer: BC Managed Care – PPO | Attending: Obstetrics and Gynecology | Admitting: Physical Therapy

## 2019-07-22 ENCOUNTER — Other Ambulatory Visit: Payer: Self-pay

## 2019-07-22 ENCOUNTER — Encounter: Payer: Self-pay | Admitting: Physical Therapy

## 2019-07-22 DIAGNOSIS — R278 Other lack of coordination: Secondary | ICD-10-CM | POA: Diagnosis present

## 2019-07-22 DIAGNOSIS — G8929 Other chronic pain: Secondary | ICD-10-CM

## 2019-07-22 DIAGNOSIS — M545 Low back pain: Secondary | ICD-10-CM | POA: Insufficient documentation

## 2019-07-22 DIAGNOSIS — M62838 Other muscle spasm: Secondary | ICD-10-CM | POA: Diagnosis not present

## 2019-07-22 NOTE — Therapy (Signed)
**Note Hannah-Identified via Obfuscation** [facility] [address] [place], [place], [address] Phone: [phone]   Fax:  [phone]  Physical Therapy Treatment  Patient Details  Name: Hannah Hancock MRN: [id number] Date of Birth: 03/20/1996 Referring Provider (PT): McVey   Encounter Date: 07/22/2019  PT End of Session - 07/22/19 1738    Visit Number  2    Number of Visits  13    Date for PT Re-Evaluation  10/06/19    Authorization Type  IE 07/13/2020    PT Start Time  0533    PT Stop Time  0628    PT Time Calculation (min)  55 min    Activity Tolerance  Patient tolerated treatment well    Behavior During Therapy  [facility] for tasks assessed/performed       Past Medical History:  Diagnosis Date  . Abdominal pain, recurrent   . 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 Anxiety   . Asthma    no meds, no recent attacks  . Gallstones   . Tendinitis    R hip  . Urinary tract infection    Last one 4 months ago  . Weight loss     Past Surgical History:  Procedure Laterality Date  . ADENOIDECTOMY    . CHOLECYSTECTOMY N/A 11/03/2017   Procedure: LAPAROSCOPIC CHOLECYSTECTOMY;  Surgeon: Herbert Pun, MD;  Location: [facility];  Service: General;  Laterality: N/A;  . ENDOSCOPIC RETROGRADE CHOLANGIOPANCREATOGRAPHY (ERCP) WITH PROPOFOL N/A 11/02/2017   Procedure: ENDOSCOPIC RETROGRADE CHOLANGIOPANCREATOGRAPHY (ERCP) WITH PROPOFOL;  Surgeon: Lucilla Lame, MD;  Location: [facility] ENDOS[facility];  Service: Endoscopy;  Laterality: N/A;  . ESOPHAGOGASTRODUODENOSCOPY  10/20/2011   Procedure: ESOPHAGOGASTRODUODENOSCOPY (EGD);  Surgeon: [name], MD;  Location: [place];  Service: Gastroenterology;  Laterality: N/A;    There were no vitals filed for this visit.  Subjective Assessment - 07/22/19 1734    Subjective  Patient arrived 63 minutes later for her appointment, but was able to be rescheduled. Patient notes that nothing has changed. She has not been able to try any of  the bowel retraining steps because she had fatty food. Patient notes that pain has been okay. Patient has started Vshred for exercise and is doing HIIT/interval training with that 5x/week. She notes some increased back pain with doing her old PFPT HEP after doing V shred.    Currently in Pain?  Yes    Pain Score  4     Pain Location  Perineum      TREATMENT Manual Therapy: STM and TPR performed to B gluteals and lower lumbar paraspinals to allow for decreased tension and pain and improved posture and function R Sacral border mobilization to allow for decreased sensitization and improved tolerance to supine position  Neuromuscular Re-education: Reviewed previous HEP from past PFPT experience. Hip hinge with dowel x10 with VCs and TCs for controlled ROM and improved postural control Hip hinge squat with dowel x5 with VCs and TCs for controlled ROM and improved postural control Supine hooklying diaphragmatic breathing with VCs and TCs for downregulation of the nervous system and improved management of IAP Supine hooklying posterior pelvic tilts with VCs and TCs for controlled ROM and gentle stretching of lumbar paraspinals Supine hooklying, TrA activation with exhalation (5 sec hold x10). VCs and TCs to decrease compensatory patterns and minimize aggravation of the lumbar paraspinals. Supine heel slides with TrA, coordinated breath 2x10   Patient educated throughout session on appropriate technique and form using multi-modal cueing, HEP,  and activity modification. Patient articulated understanding and returned demonstration.  Patient Response to interventions: It feels a little sore. Denies any questions/concerns.  ASSESSMENT Patient presents to clinic with excellent motivation to participate in therapy. Patient demonstrates deficits in pain, posture, RLE strength, R hip ROM, PFM spasm. Patient able to achieve gentle lengthening of lumbar paraspinals with single knee ot chest without  irritation to LBP during today's session and responded positively to educational interventions. Patient will benefit from continued skilled therapeutic intervention to address remaining deficits in pain, posture, RLE strength, R hip ROM, PFM spasm  in order to increase function and improve overall QOL.    PT Short Term Goals - 07/24/18 1807      PT SHORT TERM GOAL #1   Title  Patient will demonstrate a coordinated contraction, relaxation, and bulge of the pelvic floor muscles to demonstrate functional recruitment and motion and allow for further strengthening.    Time  6    Period  Weeks    Status  Achieved    Target Date  01/10/18      PT SHORT TERM GOAL #2   Title  Patient will demonstrate improved pelvic alignment and balance of musculature surrounding the pelvis to facilitate decreased PFM spasms and decrease pelvic pain.    Time  3    Period  Weeks    Status  Achieved    Target Date  12/20/17      PT SHORT TERM GOAL #3   Title  Patient will report consistent use of foot-stool (squatty-potty) for positioning with BM to decrease pain with BM and intra-abdominal pressure.    Baseline  Pt. did not remember education on stool so it was reviewed this session. Patient has decided not to buy stool due to expense and improvement of BM ease.    Time  3    Period  Weeks    Status  Not Met    Target Date  03/27/18      PT SHORT TERM GOAL #4   Title  Patient will demonstrate HEP x1 in the clinic to demonstrate understanding and proper form to allow for further improvement.    Time  3    Period  Weeks    Status  Achieved    Target Date  12/20/17        PT Long Term Goals - 07/14/19 1914      PT LONG TERM GOAL #1   Title  Patient will demonstrate independence with HEP in order to maximize therapeutic gains and improve carryover from physical therapy sessions to ADLs in the home and community.    Baseline  IE: not demonstrated    Time  12    Period  Weeks    Status  New    Target  Date  10/06/19      PT LONG TERM GOAL #2   Title  Patient will demonstrate independent and coordinated diaphragmatic breathing in supine with a 1:2 breathing pattern for improved down-regulation of the nervous system and improved management of intra-abdominal pressures in order to increase function at home and in the community.    Baseline  IE: not demonstrated    Time  12    Period  Weeks    Status  New    Target Date  10/06/19      PT LONG TERM GOAL #3   Title  Patient will decrease worst pain as reported on NPRS by at least 2 points to demonstrate clinically significant  reduction in pain in order to restore/improve function and overall QOL.    Baseline  IE: 8/10    Time  12    Period  Weeks    Status  New    Target Date  10/06/19      PT LONG TERM GOAL #4   Title  Patient will indicate at least a 7 point difference on the NIH-CPSI Female form to demonstrate clinically significant improvement in pain severity for improved overall QOL.    Baseline  IE: 23/43 Pain 14, Urinary 1, QOL 8    Time  12    Period  Weeks    Status  New    Target Date  10/06/19      PT LONG TERM GOAL #5   Title  Patient will demonstrate understanding of basic self-management/down-regulation of the nervous system for persistent pain condition and stress as evidenced by diaphragmatic breathing without cueing, body scan/progressive relaxation meditation, and improved sleep hygiene including in order to transition to independent management of patient's chief complaint:pelvic pain.    Baseline  IE: not demonstrated    Time  12    Period  Weeks    Status  New    Target Date  10/06/19            Plan - 07/22/19 1837    Clinical Impression Statement  Patient presents to clinic with excellent motivation to participate in therapy. Patient demonstrates deficits in pain, posture, RLE strength, R hip ROM, PFM spasm. Patient able to achieve gentle lengthening of lumbar paraspinals with single knee ot chest without  irritation to LBP during today's session and responded positively to educational interventions. Patient will benefit from continued skilled therapeutic intervention to address remaining deficits in pain, posture, RLE strength, R hip ROM, PFM spasm  in order to increase function and improve overall QOL.    Personal Factors and Comorbidities  Age;Fitness;Past/Current Experience;Behavior Pattern;Social Background;Time since onset of injury/illness/exacerbation;Finances;Comorbidity 3+    Comorbidities  ADHD, anxiety, asthma    Examination-Activity Limitations  Transfers;Locomotion Level;Caring for Others;Sit;Sleep;Squat;Stairs;Stand;Toileting;Lift;Continence    Examination-Participation Restrictions  Cleaning;Meal Prep;Driving;Laundry;Yard Work;Shop;Community Activity;Personal Finances    Stability/Clinical Decision Making  Evolving/Moderate complexity    Rehab Potential  Fair    PT Frequency  1x / week    PT Duration  12 weeks    PT Treatment/Interventions  ADLs/Self Care Home Management;Biofeedback;Cryotherapy;Electrical Stimulation;Moist Heat;Balance training;Therapeutic exercise;Therapeutic activities;Gait training;Stair training;Functional mobility training;Neuromuscular re-education;Patient/family education;Dry needling;Passive range of motion;Scar mobilization;Manual techniques;Taping;Spinal Manipulations;Joint Manipulations    PT Next Visit Plan  External PFM exam, PCST    PT Home Exercise Plan  bowel retraining    Consulted and Agree with Plan of Care  Patient       Patient will benefit from skilled therapeutic intervention in order to improve the following deficits and impairments:  Decreased coordination, Impaired tone, Pain, Improper body mechanics, Postural dysfunction, Decreased strength, Impaired flexibility, Increased muscle spasms, Decreased range of motion  Visit Diagnosis: Other muscle spasm  Other lack of coordination  Chronic midline low back pain without  sciatica     Problem List Patient Active Problem List   Diagnosis Date Noted  . UTI (urinary tract infection) 07/19/2018  . Common bile duct stone   . Abnormal liver function tests   . Abnormal magnetic resonance imaging of liver   . Transaminitis 11/01/2017  . Third degree laceration of perineum during delivery, postpartum 10/06/2017  . Gestational hypertension 10/03/2017  . Abdominal pain affecting pregnancy 06/28/2017  . ADHD,  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  . Presence of subdermal contraceptive device 04/07/2014  . Sleep disturbance 11/03/2013  . Adjustment disorder with mixed anxiety and depressed mood 08/12/2013   Myles Gip PT, DPT [phone] 07/22/2019, 6:40 PM  [facility] [address] [place], [place], [address] Phone: [phone]   Fax:  [phone]  Name: Hannah Hancock MRN: [id number] Date of Birth: February 13, 1996

## 2019-07-28 ENCOUNTER — Encounter: Payer: BC Managed Care – PPO | Admitting: Physical Therapy

## 2019-08-04 ENCOUNTER — Encounter: Payer: BC Managed Care – PPO | Admitting: Physical Therapy

## 2019-08-05 ENCOUNTER — Other Ambulatory Visit: Payer: Self-pay

## 2019-08-05 ENCOUNTER — Ambulatory Visit: Payer: BC Managed Care – PPO | Admitting: Physical Therapy

## 2019-08-05 ENCOUNTER — Encounter: Payer: Self-pay | Admitting: Physical Therapy

## 2019-08-05 DIAGNOSIS — G8929 Other chronic pain: Secondary | ICD-10-CM

## 2019-08-05 DIAGNOSIS — M62838 Other muscle spasm: Secondary | ICD-10-CM | POA: Diagnosis not present

## 2019-08-05 DIAGNOSIS — R278 Other lack of coordination: Secondary | ICD-10-CM

## 2019-08-05 NOTE — Therapy (Signed)
[facility] [address] [place], [place], [address] Phone: [phone]   Fax:  [phone]  Physical Therapy Treatment  Patient Details  Name: Hannah Hancock MRN: [id number] Date of Birth: [birth year] Referring Provider (PT): McVey   Encounter Date: 08/05/2019  PT End of Session - 08/05/19 1843    Visit Number  3    Number of Visits  13    Date for PT Re-Evaluation  10/06/19    Authorization Type  IE 07/13/2020    PT Start Time  0543    PT Stop Time  0636    PT Time Calculation (min)  53 min    Activity Tolerance  Patient tolerated treatment well    Behavior During Therapy  [facility] for tasks assessed/performed       Past Medical History:  Diagnosis Date  . Abdominal pain, recurrent   . 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 Anxiety   . Asthma    no meds, no recent attacks  . Gallstones   . Tendinitis    R hip  . Urinary tract infection    Last one 4 months ago  . Weight loss     Past Surgical History:  Procedure Laterality Date  . ADENOIDECTOMY    . CHOLECYSTECTOMY N/A 11/03/2017   Procedure: LAPAROSCOPIC CHOLECYSTECTOMY;  Surgeon: Herbert Pun, MD;  Location: [facility];  Service: General;  Laterality: N/A;  . ENDOSCOPIC RETROGRADE CHOLANGIOPANCREATOGRAPHY (ERCP) WITH PROPOFOL N/A 11/02/2017   Procedure: ENDOSCOPIC RETROGRADE CHOLANGIOPANCREATOGRAPHY (ERCP) WITH PROPOFOL;  Surgeon: Lucilla Lame, MD;  Location: [facility];  Service: Endoscopy;  Laterality: N/A;  . ESOPHAGOGASTRODUODENOSCOPY  10/20/2011   Procedure: ESOPHAGOGASTRODUODENOSCOPY (EGD);  Surgeon: [name], MD;  Location: [place];  Service: Gastroenterology;  Laterality: N/A;    There were no vitals filed for this visit.  Subjective Assessment - 08/05/19 1743    Subjective  Patient arrived 13 minutes late for her appointment due to her school schedule. Patient notes that she has had increased stress since starting back to school and  is sore in her back from sitting at a desk all day. Patient notes that she has not been able to do any of her PT exercises due to her demanding school schedule.    Currently in Pain?  Yes    Pain Score  4     Pain Location  Back    Pain Orientation  Mid;Lower;Left;Right    Pain Descriptors / Indicators  Sore;Aching       TREATMENT Manual Therapy: STM and TPR performed to thoracic and lumbar paraspinals to allow for decreased tension and pain and improved posture and function Scar mobilization at R external PFM 8 oclock to allow for improved mobility and function Scar mobilizations with movement for decreased spasm and improved mobility, R bent knee fallout CPA/UPA T4-L2, multiple bouts, grade III/IV for improved mobility  Neuromuscular Re-education: Prone diaphragmatic breathing with VCs and TCs for downregulation of the nervous system and improved management of IAP Prone, TrA activation with exhalation (5 sec hold x10). VCs and TCs to decrease compensatory patterns and minimize aggravation of the lumbar paraspinals. Supine bridge with spinal segmentation for improved postural awareness and decreased hyperextension of thoracolumbar jxn and lumbar region DRAM correction on 1/2 foam roll with TCs for closure of rib flare to improve postural awareness and thoracic extension Happy baby with diaphragmatic breath and PFM lengthening for improved tissue lengthening and decrease spasm Patient education on desk exercises for decreased  spinal stiffness and back pain as a result of prolonged positioning for school.  Patient educated throughout session on appropriate technique and form using multi-modal cueing, HEP, and activity modification. Patient articulated understanding and returned demonstration.   ASSESSMENT Patient presents to clinic with excellent motivation to participate in therapy. Patient demonstrates deficits in pain, posture, RLE strength, R hip ROM, PFM spasm. Patient having  difficulty with supine thoracic mobilizations on foam roll during today's session and limited by pain and fatigue. Patient will benefit from continued skilled therapeutic intervention to address remaining deficits in pain, posture, RLE strength, R hip ROM, PFM spasm  in order to increase function and improve overall QOL.    PT Long Term Goals - 07/14/19 1914      PT LONG TERM GOAL #1   Title  Patient will demonstrate independence with HEP in order to maximize therapeutic gains and improve carryover from physical therapy sessions to ADLs in the home and community.    Baseline  IE: not demonstrated    Time  12    Period  Weeks    Status  New    Target Date  10/06/19      PT LONG TERM GOAL #2   Title  Patient will demonstrate independent and coordinated diaphragmatic breathing in supine with a 1:2 breathing pattern for improved down-regulation of the nervous system and improved management of intra-abdominal pressures in order to increase function at home and in the community.    Baseline  IE: not demonstrated    Time  12    Period  Weeks    Status  New    Target Date  10/06/19      PT LONG TERM GOAL #3   Title  Patient will decrease worst pain as reported on NPRS by at least 2 points to demonstrate clinically significant reduction in pain in order to restore/improve function and overall QOL.    Baseline  IE: 8/10    Time  12    Period  Weeks    Status  New    Target Date  10/06/19      PT LONG TERM GOAL #4   Title  Patient will indicate at least a 7 point difference on the NIH-CPSI Female form to demonstrate clinically significant improvement in pain severity for improved overall QOL.    Baseline  IE: 23/43 Pain 14, Urinary 1, QOL 8    Time  12    Period  Weeks    Status  New    Target Date  10/06/19      PT LONG TERM GOAL #5   Title  Patient will demonstrate understanding of basic self-management/down-regulation of the nervous system for persistent pain condition and stress as  evidenced by diaphragmatic breathing without cueing, body scan/progressive relaxation meditation, and improved sleep hygiene including in order to transition to independent management of patient's chief complaint:pelvic pain.    Baseline  IE: not demonstrated    Time  12    Period  Weeks    Status  New    Target Date  10/06/19            Plan - 08/05/19 1843    Clinical Impression Statement  Patient presents to clinic with excellent motivation to participate in therapy. Patient demonstrates deficits in pain, posture, RLE strength, R hip ROM, PFM spasm. Patient having difficulty with supine thoracic mobilizations on foam roll during today's session and limited by pain and fatigue. Patient will benefit from continued  skilled therapeutic intervention to address remaining deficits in pain, posture, RLE strength, R hip ROM, PFM spasm  in order to increase function and improve overall QOL.    Personal Factors and Comorbidities  Age;Fitness;Past/Current Experience;Behavior Pattern;Social Background;Time since onset of injury/illness/exacerbation;Finances;Comorbidity 3+    Comorbidities  ADHD, anxiety, asthma    Examination-Activity Limitations  Transfers;Locomotion Level;Caring for Others;Sit;Sleep;Squat;Stairs;Stand;Toileting;Lift;Continence    Examination-Participation Restrictions  Cleaning;Meal Prep;Driving;Laundry;Yard Work;Shop;Community Activity;Personal Finances    Stability/Clinical Decision Making  Evolving/Moderate complexity    Rehab Potential  Fair    PT Frequency  1x / week    PT Duration  12 weeks    PT Treatment/Interventions  ADLs/Self Care Home Management;Biofeedback;Cryotherapy;Electrical Stimulation;Moist Heat;Balance training;Therapeutic exercise;Therapeutic activities;Gait training;Stair training;Functional mobility training;Neuromuscular re-education;Patient/family education;Dry needling;Passive range of motion;Scar mobilization;Manual techniques;Taping;Spinal  Manipulations;Joint Manipulations    PT Next Visit Plan  External PFM exam, PCST    PT Home Exercise Plan  bowel retraining    Consulted and Agree with Plan of Care  Patient       Patient will benefit from skilled therapeutic intervention in order to improve the following deficits and impairments:  Decreased coordination, Impaired tone, Pain, Improper body mechanics, Postural dysfunction, Decreased strength, Impaired flexibility, Increased muscle spasms, Decreased range of motion  Visit Diagnosis: Other muscle spasm  Other lack of coordination  Chronic midline low back pain without sciatica     Problem List Patient Active Problem List   Diagnosis Date Noted  . UTI (urinary tract infection) 07/19/2018  . Common bile duct stone   . Abnormal liver function tests   . Abnormal magnetic resonance imaging of liver   . Transaminitis 11/01/2017  . Third degree laceration of perineum during delivery, postpartum 10/06/2017  . Gestational hypertension 10/03/2017  . Abdominal pain affecting pregnancy 06/28/2017  . 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 Presence of subdermal contraceptive device 04/07/2014  . Sleep disturbance 11/03/2013  . Adjustment disorder with mixed anxiety and depressed mood 08/12/2013   Sheria Lang PT, DPT [phone] 08/05/2019, 6:53 PM  [facility] [address] [place], [name], [address] Phone: [phone]   Fax:  [phone]  Name: JOSEPHINA MELCHER MRN: [id number] Date of Birth: 1996-01-26

## 2019-08-11 ENCOUNTER — Ambulatory Visit: Payer: BC Managed Care – PPO | Admitting: Physical Therapy

## 2019-08-14 ENCOUNTER — Ambulatory Visit: Payer: BC Managed Care – PPO | Admitting: Physical Therapy

## 2019-08-14 ENCOUNTER — Encounter: Payer: Self-pay | Admitting: Physical Therapy

## 2019-08-14 ENCOUNTER — Other Ambulatory Visit: Payer: Self-pay

## 2019-08-14 DIAGNOSIS — G8929 Other chronic pain: Secondary | ICD-10-CM

## 2019-08-14 DIAGNOSIS — R278 Other lack of coordination: Secondary | ICD-10-CM

## 2019-08-14 DIAGNOSIS — M62838 Other muscle spasm: Secondary | ICD-10-CM

## 2019-08-14 NOTE — Therapy (Signed)
[facility] [address] [place], [name], [address] Phone: [phone]   Fax:  [phone]  Physical Therapy Treatment  Patient Details  Name: Hannah Hancock MRN: [id number] Date of Birth: [birth year] Referring Provider (PT): McVey   Encounter Date: 08/14/2019  PT End of Session - 08/14/19 0958    Visit Number  4    Number of Visits  13    Date for PT Re-Evaluation  10/06/19    Authorization Type  IE 07/13/2020    PT Start Time  0955    PT Stop Time  1050    PT Time Calculation (min)  55 min    Activity Tolerance  Patient tolerated treatment well    Behavior During Therapy  [facility] for tasks assessed/performed       Past Medical History:  Diagnosis Date  . Abdominal pain, recurrent   . 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 Anxiety   . Asthma    no meds, no recent attacks  . Gallstones   . Tendinitis    R hip  . Urinary tract infection    Last one 4 months ago  . Weight loss     Past Surgical History:  Procedure Laterality Date  . ADENOIDECTOMY    . CHOLECYSTECTOMY N/A 11/03/2017   Procedure: LAPAROSCOPIC CHOLECYSTECTOMY;  Surgeon: Carolan Shiver, MD;  Location: [facility] ORS;  Service: General;  Laterality: N/A;  . ENDOSCOPIC RETROGRADE CHOLANGIOPANCREATOGRAPHY (ERCP) WITH PROPOFOL N/A 11/02/2017   Procedure: ENDOSCOPIC RETROGRADE CHOLANGIOPANCREATOGRAPHY (ERCP) WITH PROPOFOL;  Surgeon: Midge Minium, MD;  Location: [facility] END[facility]COPY;  Service: Endoscopy;  Laterality: N/A;  . ESOPHAGOGASTRODUODENOSCOPY  10/20/2011   Procedure: ESOPHAGOGASTRODUODENOSCOPY (EGD);  Surgeon: Jon Gills, MD;  Location: [facility] OR;  Service: Gastroenterology;  Laterality: N/A;    There were no vitals filed for this visit.  Subjective Assessment - 08/14/19 0954    Subjective  Patient presents to clinic with an unhappy back due to increased school demands and a very active toddler. Patient also has bacterial vaginitis and is presently  on two antibiotics.    Currently in Pain?  Yes    Pain Score  2     Pain Location  Back    Pain Descriptors / Indicators  Aching;Sore      TREATMENT Manual Therapy: STM and TPR performed to thoracic and lumbar paraspinals to allow for decreased tension and pain and improved posture and function CPA/UPA T4-L2, multiple bouts, grade III/IV for improved mobility  Neuromuscular Re-education: Prone diaphragmatic breathing with VCs and TCs for downregulation of the nervous system and improved management of IAP Prone Baby cobra/ modified swan, x10 with VCs to decrease compensations and maximize thoracic extension Standing at wall baby cobra/ modified swan, x10 with VCs to decrease compensations and maximize thoracic extension Patient education on graded exposure approach to dilators for improved nervous system downregulation and PFM relaxation. Patient articulating understanding.   Patient educated throughout session on appropriate technique and form using multi-modal cueing, HEP, and activity modification. Patient articulated understanding and returned demonstration.   ASSESSMENT Patient presents to clinic with excellent motivation to participate in therapy. Patient demonstrates deficits in pain, posture, RLE strength, R hip ROM, PFM spasm. Patient able to coordinate prone modified swan with good form and absent of pain during today's session and responded positively to educational interventions. Patient will benefit from continued skilled therapeutic intervention to address remaining deficits in pain, posture, RLE strength, R hip ROM, PFM spasm  in order to  increase function and improve overall QOL.    PT Long Term Goals - 07/14/19 1914      PT LONG TERM GOAL #1   Title  Patient will demonstrate independence with HEP in order to maximize therapeutic gains and improve carryover from physical therapy sessions to ADLs in the home and community.    Baseline  IE: not demonstrated    Time  12     Period  Weeks    Status  New    Target Date  10/06/19      PT LONG TERM GOAL #2   Title  Patient will demonstrate independent and coordinated diaphragmatic breathing in supine with a 1:2 breathing pattern for improved down-regulation of the nervous system and improved management of intra-abdominal pressures in order to increase function at home and in the community.    Baseline  IE: not demonstrated    Time  12    Period  Weeks    Status  New    Target Date  10/06/19      PT LONG TERM GOAL #3   Title  Patient will decrease worst pain as reported on NPRS by at least 2 points to demonstrate clinically significant reduction in pain in order to restore/improve function and overall QOL.    Baseline  IE: 8/10    Time  12    Period  Weeks    Status  New    Target Date  10/06/19      PT LONG TERM GOAL #4   Title  Patient will indicate at least a 7 point difference on the NIH-CPSI Female form to demonstrate clinically significant improvement in pain severity for improved overall QOL.    Baseline  IE: 23/43 Pain 14, Urinary 1, QOL 8    Time  12    Period  Weeks    Status  New    Target Date  10/06/19      PT LONG TERM GOAL #5   Title  Patient will demonstrate understanding of basic self-management/down-regulation of the nervous system for persistent pain condition and stress as evidenced by diaphragmatic breathing without cueing, body scan/progressive relaxation meditation, and improved sleep hygiene including in order to transition to independent management of patient's chief complaint:pelvic pain.    Baseline  IE: not demonstrated    Time  12    Period  Weeks    Status  New    Target Date  10/06/19            Plan - 08/14/19 0959    Clinical Impression Statement  Patient presents to clinic with excellent motivation to participate in therapy. Patient demonstrates deficits in pain, posture, RLE strength, R hip ROM, PFM spasm. Patient able to coordinate prone modified swan with  good form and absent of pain during today's session and responded positively to educational interventions. Patient will benefit from continued skilled therapeutic intervention to address remaining deficits in pain, posture, RLE strength, R hip ROM, PFM spasm  in order to increase function and improve overall QOL.    Personal Factors and Comorbidities  Age;Fitness;Past/Current Experience;Behavior Pattern;Social Background;Time since onset of injury/illness/exacerbation;Finances;Comorbidity 3+    Comorbidities  ADHD, anxiety, asthma    Examination-Activity Limitations  Transfers;Locomotion Level;Caring for Others;Sit;Sleep;Squat;Stairs;Stand;Toileting;Lift;Continence    Examination-Participation Restrictions  Cleaning;Meal Prep;Driving;Laundry;Yard Work;Shop;Community Activity;Personal Finances    Stability/Clinical Decision Making  Evolving/Moderate complexity    Rehab Potential  Fair    PT Frequency  1x / week    PT Duration  12 weeks    PT Treatment/Interventions  ADLs/Self Care Home Management;Biofeedback;Cryotherapy;Electrical Stimulation;Moist Heat;Balance training;Therapeutic exercise;Therapeutic activities;Gait training;Stair training;Functional mobility training;Neuromuscular re-education;Patient/family education;Dry needling;Passive range of motion;Scar mobilization;Manual techniques;Taping;Spinal Manipulations;Joint Manipulations    PT Next Visit Plan  External PFM exam, PCST    PT Home Exercise Plan  bowel retraining    Consulted and Agree with Plan of Care  Patient       Patient will benefit from skilled therapeutic intervention in order to improve the following deficits and impairments:  Decreased coordination, Impaired tone, Pain, Improper body mechanics, Postural dysfunction, Decreased strength, Impaired flexibility, Increased muscle spasms, Decreased range of motion  Visit Diagnosis: Other muscle spasm  Other lack of coordination  Chronic midline low back pain without  sciatica     Problem List Patient Active Problem List   Diagnosis Date Noted  . UTI (urinary tract infection) 07/19/2018  . Common bile duct stone   . Abnormal liver function tests   . Abnormal magnetic resonance imaging of liver   . Transaminitis 11/01/2017  . Third degree laceration of perineum during delivery, postpartum 10/06/2017  . Gestational hypertension 10/03/2017  . Abdominal pain affecting pregnancy 06/28/2017  . 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 Presence of subdermal contraceptive device 04/07/2014  . Sleep disturbance 11/03/2013  . Adjustment disorder with mixed anxiety and depressed mood 08/12/2013   Sheria LangKatlin Iya Hamed PT, DPT [phone]#[id number] 08/14/2019, 11:40 AM  [facility] [address] [place], [place], [address] Phone: [phone]   Fax:  [phone]  Name: Modesta MessingChasity D Liou MRN: [id number] Date of Birth: 12/10/1995

## 2019-08-18 ENCOUNTER — Encounter: Payer: BC Managed Care – PPO | Admitting: Physical Therapy

## 2019-08-21 ENCOUNTER — Other Ambulatory Visit: Payer: Self-pay

## 2019-08-21 ENCOUNTER — Ambulatory Visit: Payer: BC Managed Care – PPO | Admitting: Physical Therapy

## 2019-08-21 ENCOUNTER — Encounter: Payer: Self-pay | Admitting: Physical Therapy

## 2019-08-21 DIAGNOSIS — M62838 Other muscle spasm: Secondary | ICD-10-CM

## 2019-08-21 DIAGNOSIS — R278 Other lack of coordination: Secondary | ICD-10-CM

## 2019-08-21 DIAGNOSIS — G8929 Other chronic pain: Secondary | ICD-10-CM

## 2019-08-21 NOTE — Therapy (Signed)
[facility] [address] [place], [place], [address] Phone: [phone]   Fax:  [phone]  Physical Therapy Treatment  Patient Details  Name: Hannah MessingChasity D Hancock MRN: [id number] Date of Birth: 05/08/1996 Referring Provider (PT): McVey   Encounter Date: 08/21/2019  PT End of Session - 08/21/19 1750    Visit Number  5    Number of Visits  13    Date for PT Re-Evaluation  10/06/19    Authorization Type  IE 07/13/2020    PT Start Time  1745    PT Stop Time  1840    PT Time Calculation (min)  55 min    Activity Tolerance  Patient tolerated treatment well    Behavior During Therapy  [facility] for tasks assessed/performed       Past Medical History:  Diagnosis Date  . Abdominal pain, recurrent   . 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 Anxiety   . Asthma    no meds, no recent attacks  . Gallstones   . Tendinitis    R hip  . Urinary tract infection    Last one 4 months ago  . Weight loss     Past Surgical History:  Procedure Laterality Date  . ADENOIDECTOMY    . CHOLECYSTECTOMY N/A 11/03/2017   Procedure: LAPAROSCOPIC CHOLECYSTECTOMY;  Surgeon: Carolan Shiverintron-Diaz, Edgardo, MD;  Location: [facility] ORS;  Service: General;  Laterality: N/A;  . ENDOSCOPIC RETROGRADE CHOLANGIOPANCREATOGRAPHY (ERCP) WITH PROPOFOL N/A 11/02/2017   Procedure: ENDOSCOPIC RETROGRADE CHOLANGIOPANCREATOGRAPHY (ERCP) WITH PROPOFOL;  Surgeon: Midge MiniumWohl, Darren, MD;  Location: [facility] END[facility]COPY;  Service: Endoscopy;  Laterality: N/A;  . ESOPHAGOGASTRODUODENOSCOPY  10/20/2011   Procedure: ESOPHAGOGASTRODUODENOSCOPY (EGD);  Surgeon: Jon GillsJoseph H. Clark, MD;  [facility];  Service: Gastroenterology;  Laterality: N/A;    There were no vitals filed for this visit.  Subjective Assessment - 08/21/19 1746    Subjective  Patient arrives 15 minutes late for her session because of her school schedule. 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 Patient is still on antibiotics and reports that her pain and irritation vaginally is not bothering her as much. Patient has not been able to work in her stretches during her day due to hands on work at school.    Currently in Pain?  Yes    Pain Score  4     Pain Location  Back    Pain Descriptors / Indicators  Aching       TREATMENT Manual Therapy: STM and TPR (via percussive/vibratory device) performed to thoracic and lumbar paraspinals to allow for decreased tension and pain and improved posture and function  Neuromuscular Re-education: Prone diaphragmatic breathing with VCs and TCs for downregulation of the nervous system and improved management of IAP Child's pose with diaphragmatic breathing for improved posterior rib excursion and decreased spasm in intercostals Thread the needle performed bilaterally with TCs for lateral breath to improve diaphragm excursion and intercostal mm release Reassessed goals; see below.  Patient discussion on barriers to HEP and patient encouraged to reflect on where she could layer stretches into her pre-existing ADLs.   Patient educated throughout session on appropriate technique and form using multi-modal cueing, HEP, and activity modification. Patient articulated understanding and returned demonstration.   ASSESSMENT Patient presents to clinic with excellent motivation to participate in therapy. Patient demonstrates deficits in pain, posture, RLE strength, R hip ROM, PFM spasm. Patient responded well to percussive STM  of paraspinals with 1-2 point drop in pain on NPRS during today's session and responded positively to active interventions. Patient's condition has the potential to improve in response to therapy. Maximum improvement is yet to be obtained. The anticipated improvement is attainable and reasonable in a generally predictable time. Patient will benefit from continued skilled therapeutic intervention to address remaining deficits in  pain, posture, RLE strength, R hip ROM, PFM spasm  in order to increase function and improve overall QOL.   PT Long Term Goals - 08/21/19 1751      PT LONG TERM GOAL #1   Title  Patient will demonstrate independence with HEP in order to maximize therapeutic gains and improve carryover from physical therapy sessions to ADLs in the home and community.    Baseline  IE: not demonstrated; 08/21/2019: not demonstrated    Time  12    Period  Weeks    Status  On-going    Target Date  10/06/19      PT LONG TERM GOAL #2   Title  Patient will demonstrate independent and coordinated diaphragmatic breathing in supine with a 1:2 breathing pattern for improved down-regulation of the nervous system and improved management of intra-abdominal pressures in order to increase function at home and in the community.    Baseline  IE: not demonstrated; 08/21/2019: demonstrated in supine, seated, and prone    Time  12    Period  Weeks    Status  Achieved    Target Date  10/06/19      PT LONG TERM GOAL #3   Title  Patient will decrease worst pain as reported on NPRS by at least 2 points to demonstrate clinically significant reduction in pain in order to restore/improve function and overall QOL.    Baseline  IE: 8/10; 08/21/2019: 4/10    Time  12    Period  Weeks    Status  On-going    Target Date  09/26/19      PT LONG TERM GOAL #4   Title  Patient will indicate at least a 7 point difference on the NIH-CPSI Female form to demonstrate clinically significant improvement in pain severity for improved overall QOL.    Baseline  IE: 23/43 Pain 14, Urinary 1, QOL 8; 12/31: 18/43 Pain 8, Urinary 3, QOL 7    Time  12    Period  Weeks    Status  New    Target Date  10/06/19      PT LONG TERM GOAL #5   Title  Patient will demonstrate understanding of basic self-management/down-regulation of the nervous system for persistent pain condition and stress as evidenced by diaphragmatic breathing without cueing, body  scan/progressive relaxation meditation, and improved sleep hygiene including in order to transition to independent management of patient's chief complaint:pelvic pain.    Baseline  IE: not demonstrated; 08/21/2019: not demonstrated    Time  12    Period  Weeks    Status  On-going    Target Date  10/06/19            Plan - 08/21/19 1848    Clinical Impression Statement  Patient presents to clinic with excellent motivation to participate in therapy. Patient demonstrates deficits in pain, posture, RLE strength, R hip ROM, PFM spasm. Patient responded well to percussive STM of paraspinals with 1-2 point drop in pain on NPRS during today's session and responded positively to active interventions. Patient's condition has the potential to improve in response to  therapy. Maximum improvement is yet to be obtained. The anticipated improvement is attainable and reasonable in a generally predictable time. Patient will benefit from continued skilled therapeutic intervention to address remaining deficits in pain, posture, RLE strength, R hip ROM, PFM spasm  in order to increase function and improve overall QOL.    Personal Factors and Comorbidities  Age;Fitness;Past/Current Experience;Behavior Pattern;Social Background;Time since onset of injury/illness/exacerbation;Finances;Comorbidity 3+    Comorbidities  ADHD, anxiety, asthma    Examination-Activity Limitations  Transfers;Locomotion Level;Caring for Others;Sit;Sleep;Squat;Stairs;Stand;Toileting;Lift;Continence    Examination-Participation Restrictions  Cleaning;Meal Prep;Driving;Laundry;Yard Work;Shop;Community Activity;Personal Finances    Stability/Clinical Decision Making  Evolving/Moderate complexity    Rehab Potential  Fair    PT Frequency  1x / week    PT Duration  12 weeks    PT Treatment/Interventions  ADLs/Self Care Home Management;Biofeedback;Cryotherapy;Electrical Stimulation;Moist Heat;Balance training;Therapeutic exercise;Therapeutic  activities;Gait training;Stair training;Functional mobility training;Neuromuscular re-education;Patient/family education;Dry needling;Passive range of motion;Scar mobilization;Manual techniques;Taping;Spinal Manipulations;Joint Manipulations    PT Next Visit Plan  External PFM exam, PCST    PT Home Exercise Plan  bowel retraining    Consulted and Agree with Plan of Care  Patient       Patient will benefit from skilled therapeutic intervention in order to improve the following deficits and impairments:  Decreased coordination, Impaired tone, Pain, Improper body mechanics, Postural dysfunction, Decreased strength, Impaired flexibility, Increased muscle spasms, Decreased range of motion  Visit Diagnosis: Other muscle spasm  Other lack of coordination  Chronic midline low back pain without sciatica     Problem List Patient Active Problem List   Diagnosis Date Noted  . UTI (urinary tract infection) 07/19/2018  . Common bile duct stone   . Abnormal liver function tests   . Abnormal magnetic resonance imaging of liver   . Transaminitis 11/01/2017  . Third degree laceration of perineum during delivery, postpartum 10/06/2017  . Gestational hypertension 10/03/2017  . Abdominal pain affecting pregnancy 06/28/2017  . 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 Presence of subdermal contraceptive device 04/07/2014  . Sleep disturbance 11/03/2013  . Adjustment disorder with mixed anxiety and depressed mood 08/12/2013   Myles Gip PT, DPT [id number] 08/21/2019, 6:50 PM  [facility] [address] [place], [place], [address] Phone: [phone]   Fax:  [phone]  Name: Hannah Hancock MRN: [id number] Date of Birth: 11-08-1995

## 2019-08-28 ENCOUNTER — Encounter: Payer: Self-pay | Admitting: Physical Therapy

## 2019-08-28 ENCOUNTER — Other Ambulatory Visit: Payer: Self-pay

## 2019-08-28 ENCOUNTER — Ambulatory Visit: Payer: BC Managed Care – PPO | Admitting: Physical Therapy

## 2019-08-28 ENCOUNTER — Ambulatory Visit: Payer: BC Managed Care – PPO | Attending: Obstetrics and Gynecology | Admitting: Physical Therapy

## 2019-08-28 DIAGNOSIS — R278 Other lack of coordination: Secondary | ICD-10-CM | POA: Diagnosis present

## 2019-08-28 DIAGNOSIS — M545 Low back pain: Secondary | ICD-10-CM | POA: Diagnosis present

## 2019-08-28 DIAGNOSIS — G8929 Other chronic pain: Secondary | ICD-10-CM | POA: Diagnosis present

## 2019-08-28 DIAGNOSIS — M62838 Other muscle spasm: Secondary | ICD-10-CM | POA: Diagnosis present

## 2019-08-28 NOTE — Therapy (Signed)
[facility] [address] [place], [place], [address] Phone: [phone]   Fax:  [phone]  Physical Therapy Treatment  Patient Details  Name: Hannah Hancock MRN: [id number] Date of Birth: Dec 10, 1995 Referring Provider (PT): McVey   Encounter Date: 08/28/2019  PT End of Session - 08/28/19 1751    Visit Number  6    Number of Visits  13    Date for PT Re-Evaluation  10/06/19    Authorization Type  IE 07/13/2020    PT Start Time  1747    PT Stop Time  1830    PT Time Calculation (min)  43 min    Activity Tolerance  Patient tolerated treatment well    Behavior During Therapy  [facility] for tasks assessed/performed       Past Medical History:  Diagnosis Date  . Abdominal pain, recurrent   . 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 Anxiety   . Asthma    no meds, no recent attacks  . Gallstones   . Tendinitis    R hip  . Urinary tract infection    Last one 4 months ago  . Weight loss     Past Surgical History:  Procedure Laterality Date  . ADENOIDECTOMY    . CHOLECYSTECTOMY N/A 11/03/2017   Procedure: LAPAROSCOPIC CHOLECYSTECTOMY;  Surgeon: Herbert Pun, MD;  Location: [facility];  Service: General;  Laterality: N/A;  . ENDOSCOPIC RETROGRADE CHOLANGIOPANCREATOGRAPHY (ERCP) WITH PROPOFOL N/A 11/02/2017   Procedure: ENDOSCOPIC RETROGRADE CHOLANGIOPANCREATOGRAPHY (ERCP) WITH PROPOFOL;  Surgeon: Lucilla Lame, MD;  Location: [facility];  Service: Endoscopy;  Laterality: N/A;  . ESOPHAGOGASTRODUODENOSCOPY  10/20/2011   Procedure: ESOPHAGOGASTRODUODENOSCOPY (EGD);  Surgeon: [name], MD;  Location: [place];  Service: Gastroenterology;  Laterality: N/A;    There were no vitals filed for this visit.  Subjective Assessment - 08/28/19 1748    Subjective  Patient presents to clinic 17 minutes late for her appointment 2/2 to getting out of school late. Patient notes that school has been very stressful and she is  feeling it more in her back. Patient reports that she has tried to do some graded exposure with a tampon and has to really focus on relaxing her PFM. Patient also notes that she feels she has increased PFM tension when she is stuck in sitting. She says she can feel herself carrying all of that tension.    Currently in Pain?  Yes    Pain Score  4     Pain Location  Back    Pain Descriptors / Indicators  Aching;Sore         TREATMENT Manual Therapy: STM and TPR (via percussive/vibratory device) performed to thoracic and lumbar paraspinals to allow for decreased tension and pain and improved posture and function  Neuromuscular Re-education: Prone diaphragmatic breathing with VCs and TCs for downregulation of the nervous system and improved management of IAP Child's pose with diaphragmatic breathing for improved posterior rib excursion and decreased spasm in intercostals Child's pose with side bend and diaphragmatic breathing for improved posterior rib excursion and decreased spasm in intercostals and QL, performed bilaterally Seated pelvic tilts for improved postural awareness and muscle spasm resolution in lumbar and PFM Modified Mermaid stretch for improved postural awareness and sidebody lengthening to encourage full diaphragmatic excursion and thereby improved chance of pelvic floor lengthening Patient encouraged to reflect on where she could layer stretches into her pre-existing ADLs.   Patient educated throughout session on appropriate technique  and form using multi-modal cueing, HEP, and activity modification. Patient articulated understanding and returned demonstration.   ASSESSMENT Patient presents to clinic with excellent motivation to participate in therapy. Patient demonstrates deficits in pain, posture, RLE strength, R hip ROM, PFM spasm. Patient continues to respond well to percussive STM of paraspinals during today's session and responded positively to active interventions.  Patient expressing interest in yoga modifications for improved participation in wellness. Patient will benefit from continued skilled therapeutic intervention to address remaining deficits in pain, posture, RLE strength, R hip ROM, PFM spasm  in order to increase function and improve overall QOL.    PT Long Term Goals - 08/21/19 1751      PT LONG TERM GOAL #1   Title  Patient will demonstrate independence with HEP in order to maximize therapeutic gains and improve carryover from physical therapy sessions to ADLs in the home and community.    Baseline  IE: not demonstrated; 08/21/2019: not demonstrated    Time  12    Period  Weeks    Status  On-going    Target Date  10/06/19      PT LONG TERM GOAL #2   Title  Patient will demonstrate independent and coordinated diaphragmatic breathing in supine with a 1:2 breathing pattern for improved down-regulation of the nervous system and improved management of intra-abdominal pressures in order to increase function at home and in the community.    Baseline  IE: not demonstrated; 08/21/2019: demonstrated in supine, seated, and prone    Time  12    Period  Weeks    Status  Achieved    Target Date  10/06/19      PT LONG TERM GOAL #3   Title  Patient will decrease worst pain as reported on NPRS by at least 2 points to demonstrate clinically significant reduction in pain in order to restore/improve function and overall QOL.    Baseline  IE: 8/10; 08/21/2019: 4/10    Time  12    Period  Weeks    Status  On-going    Target Date  09/26/19      PT LONG TERM GOAL #4   Title  Patient will indicate at least a 7 point difference on the NIH-CPSI Female form to demonstrate clinically significant improvement in pain severity for improved overall QOL.    Baseline  IE: 23/43 Pain 14, Urinary 1, QOL 8; 12/31: 18/43 Pain 8, Urinary 3, QOL 7    Time  12    Period  Weeks    Status  New    Target Date  10/06/19      PT LONG TERM GOAL #5   Title  Patient will  demonstrate understanding of basic self-management/down-regulation of the nervous system for persistent pain condition and stress as evidenced by diaphragmatic breathing without cueing, body scan/progressive relaxation meditation, and improved sleep hygiene including in order to transition to independent management of patient's chief complaint:pelvic pain.    Baseline  IE: not demonstrated; 08/21/2019: not demonstrated    Time  12    Period  Weeks    Status  On-going    Target Date  10/06/19            Plan - 08/28/19 1841    Clinical Impression Statement  Patient presents to clinic with excellent motivation to participate in therapy. Patient demonstrates deficits in pain, posture, RLE strength, R hip ROM, PFM spasm. Patient continues to respond well to percussive STM of paraspinals during  today's session and responded positively to active interventions. Patient expressing interest in yoga modifications for improved participation in wellness. Patient will benefit from continued skilled therapeutic intervention to address remaining deficits in pain, posture, RLE strength, R hip ROM, PFM spasm  in order to increase function and improve overall QOL.    Personal Factors and Comorbidities  Age;Fitness;Past/Current Experience;Behavior Pattern;Social Background;Time since onset of injury/illness/exacerbation;Finances;Comorbidity 3+    Comorbidities  ADHD, anxiety, asthma    Examination-Activity Limitations  Transfers;Locomotion Level;Caring for Others;Sit;Sleep;Squat;Stairs;Stand;Toileting;Lift;Continence    Examination-Participation Restrictions  Cleaning;Meal Prep;Driving;Laundry;Yard Work;Shop;Community Activity;Personal Finances    Stability/Clinical Decision Making  Evolving/Moderate complexity    Rehab Potential  Fair    PT Frequency  1x / week    PT Duration  12 weeks    PT Treatment/Interventions  ADLs/Self Care Home Management;Biofeedback;Cryotherapy;Electrical Stimulation;Moist  Heat;Balance training;Therapeutic exercise;Therapeutic activities;Gait training;Stair training;Functional mobility training;Neuromuscular re-education;Patient/family education;Dry needling;Passive range of motion;Scar mobilization;Manual techniques;Taping;Spinal Manipulations;Joint Manipulations    PT Next Visit Plan  External PFM exam, PCST    PT Home Exercise Plan  bowel retraining    Consulted and Agree with Plan of Care  Patient       Patient will benefit from skilled therapeutic intervention in order to improve the following deficits and impairments:  Decreased coordination, Impaired tone, Pain, Improper body mechanics, Postural dysfunction, Decreased strength, Impaired flexibility, Increased muscle spasms, Decreased range of motion  Visit Diagnosis: Other muscle spasm  Other lack of coordination  Chronic midline low back pain without sciatica     Problem List Patient Active Problem List   Diagnosis Date Noted  . UTI (urinary tract infection) 07/19/2018  . Common bile duct stone   . Abnormal liver function tests   . Abnormal magnetic resonance imaging of liver   . Transaminitis 11/01/2017  . Third degree laceration of perineum during delivery, postpartum 10/06/2017  . Gestational hypertension 10/03/2017  . Abdominal pain affecting pregnancy 06/28/2017  . 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 Presence of subdermal contraceptive device 04/07/2014  . Sleep disturbance 11/03/2013  . Adjustment disorder with mixed anxiety and depressed mood 08/12/2013   Sheria Lang PT, DPT [phone] 08/28/2019, 6:41 PM  [facility] [address] [place], [name], [address] Phone: [phone]   Fax:  [phone]  Name: KAMERIN AXFORD MRN: [id number] Date of Birth: 02/01/1996

## 2019-09-04 ENCOUNTER — Ambulatory Visit: Payer: BC Managed Care – PPO | Admitting: Physical Therapy

## 2019-09-10 ENCOUNTER — Other Ambulatory Visit: Payer: Self-pay

## 2019-09-10 DIAGNOSIS — N644 Mastodynia: Secondary | ICD-10-CM

## 2019-09-11 ENCOUNTER — Other Ambulatory Visit: Payer: Self-pay

## 2019-09-11 ENCOUNTER — Ambulatory Visit: Payer: BC Managed Care – PPO | Admitting: Physical Therapy

## 2019-09-15 ENCOUNTER — Ambulatory Visit
Admission: RE | Admit: 2019-09-15 | Discharge: 2019-09-15 | Disposition: A | Discharge status: Home / Self Care | Payer: BC Managed Care – PPO | Source: Ambulatory Visit

## 2019-09-15 DIAGNOSIS — N644 Mastodynia: Secondary | ICD-10-CM | POA: Diagnosis present

## 2019-09-18 ENCOUNTER — Encounter: Payer: Self-pay | Admitting: Physical Therapy

## 2019-09-22 ENCOUNTER — Encounter: Payer: Self-pay | Admitting: Physical Therapy

## 2019-09-22 ENCOUNTER — Ambulatory Visit: Payer: BC Managed Care – PPO | Attending: Obstetrics and Gynecology | Admitting: Physical Therapy

## 2019-09-22 ENCOUNTER — Other Ambulatory Visit: Payer: Self-pay

## 2019-09-22 DIAGNOSIS — M62838 Other muscle spasm: Secondary | ICD-10-CM | POA: Insufficient documentation

## 2019-09-22 DIAGNOSIS — M545 Low back pain: Secondary | ICD-10-CM | POA: Diagnosis present

## 2019-09-22 DIAGNOSIS — G8929 Other chronic pain: Secondary | ICD-10-CM | POA: Diagnosis present

## 2019-09-22 DIAGNOSIS — R278 Other lack of coordination: Secondary | ICD-10-CM | POA: Diagnosis present

## 2019-09-22 NOTE — Therapy (Signed)
[facility] [address] [place], [place], [address] Phone: [phone]   Fax:  [phone]  Physical Therapy Treatment  Patient Details  Name: Hannah Hancock MRN: [id number] Date of Birth: 02-Oct-1995 Referring Provider (PT): McVey   Encounter Date: 09/22/2019  PT End of Session - 09/22/19 0805    Visit Number  7    Number of Visits  13    Date for PT Re-Evaluation  10/06/19    Authorization Type  IE 07/13/2020    PT Start Time  0800    PT Stop Time  0855    PT Time Calculation (min)  55 min    Activity Tolerance  Patient tolerated treatment well    Behavior During Therapy  [facility] for tasks assessed/performed       Past Medical History:  Diagnosis Date  . Abdominal pain, recurrent   . 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 Anxiety   . Asthma    no meds, no recent attacks  . Gallstones   . Tendinitis    R hip  . Urinary tract infection    Last one 4 months ago  . Weight loss     Past Surgical History:  Procedure Laterality Date  . ADENOIDECTOMY    . CHOLECYSTECTOMY N/A 11/03/2017   Procedure: LAPAROSCOPIC CHOLECYSTECTOMY;  Surgeon: Herbert Pun, MD;  Location: [facility];  Service: General;  Laterality: N/A;  . ENDOSCOPIC RETROGRADE CHOLANGIOPANCREATOGRAPHY (ERCP) WITH PROPOFOL N/A 11/02/2017   Procedure: ENDOSCOPIC RETROGRADE CHOLANGIOPANCREATOGRAPHY (ERCP) WITH PROPOFOL;  Surgeon: Lucilla Lame, MD;  Location: [facility];  Service: Endoscopy;  Laterality: N/A;  . ESOPHAGOGASTRODUODENOSCOPY  10/20/2011   Procedure: ESOPHAGOGASTRODUODENOSCOPY (EGD);  Surgeon: [name], MD;  Location: [place];  Service: Gastroenterology;  Laterality: N/A;    There were no vitals filed for this visit.  Subjective Assessment - 09/22/19 0801    Subjective  Patient notes that she has had increased stress because her daughter had her first cold; she had to miss a lot of school. Patient notes that everyone in her family  ended up getting sick with a cold. She also notes that she found two lumps in her R breast which she followed up on with her OB and the breast center; results came back negative for any malignancy.    Currently in Pain?  Yes    Pain Score  4     Pain Location  Back       TREATMENT Manual Therapy: STM and TPR (via percussive/vibratory device) performed to thoracic and lumbar paraspinals to allow for decreased tension and pain and improved posture and function (Pain 2/10 after)  Neuromuscular Re-education: Prone diaphragmatic breathing with VCs and TCs for downregulation of the nervous system and improved management of IAP Standing diaphragmatic breathing with VCs and TCs for improved postural awareness Standing postural corrections with "stacked box" metaphor to develop awareness of reliance on bony congruency for stability. VCs, TCs, VisCs to increase awareness/retention of active vs. propped postures.  Patient discussion of goals and efficacy of PT treatment without practice of concepts outside of clinic. Patient encouraged to consider her readiness to change.   Patient educated throughout session on appropriate technique and form using multi-modal cueing, HEP, and activity modification. Patient articulated understanding and returned demonstration.   ASSESSMENT Patient presents to clinic with excellent motivation to participate in therapy. Patient demonstrates deficits in pain, posture, RLE strength, R hip ROM, PFM spasm. Patient continues to respond well to percussive STM  of paraspinals during today's session and displayed a glute dominant strategy for postural corrections in standing. Patient will benefit from continued skilled therapeutic intervention to address remaining deficits in pain, posture, RLE strength, R hip ROM, PFM spasm  in order to increase function and improve overall QOL.   PT Long Term Goals - 08/21/19 1751      PT LONG TERM GOAL #1   Title  Patient will demonstrate  independence with HEP in order to maximize therapeutic gains and improve carryover from physical therapy sessions to ADLs in the home and community.    Baseline  IE: not demonstrated; 08/21/2019: not demonstrated    Time  12    Period  Weeks    Status  On-going    Target Date  10/06/19      PT LONG TERM GOAL #2   Title  Patient will demonstrate independent and coordinated diaphragmatic breathing in supine with a 1:2 breathing pattern for improved down-regulation of the nervous system and improved management of intra-abdominal pressures in order to increase function at home and in the community.    Baseline  IE: not demonstrated; 08/21/2019: demonstrated in supine, seated, and prone    Time  12    Period  Weeks    Status  Achieved    Target Date  10/06/19      PT LONG TERM GOAL #3   Title  Patient will decrease worst pain as reported on NPRS by at least 2 points to demonstrate clinically significant reduction in pain in order to restore/improve function and overall QOL.    Baseline  IE: 8/10; 08/21/2019: 4/10    Time  12    Period  Weeks    Status  On-going    Target Date  09/26/19      PT LONG TERM GOAL #4   Title  Patient will indicate at least a 7 point difference on the NIH-CPSI Female form to demonstrate clinically significant improvement in pain severity for improved overall QOL.    Baseline  IE: 23/43 Pain 14, Urinary 1, QOL 8; 12/31: 18/43 Pain 8, Urinary 3, QOL 7    Time  12    Period  Weeks    Status  New    Target Date  10/06/19      PT LONG TERM GOAL #5   Title  Patient will demonstrate understanding of basic self-management/down-regulation of the nervous system for persistent pain condition and stress as evidenced by diaphragmatic breathing without cueing, body scan/progressive relaxation meditation, and improved sleep hygiene including in order to transition to independent management of patient's chief complaint:pelvic pain.    Baseline  IE: not demonstrated; 08/21/2019:  not demonstrated    Time  12    Period  Weeks    Status  On-going    Target Date  10/06/19            Plan - 09/22/19 0807    Clinical Impression Statement  Patient presents to clinic with excellent motivation to participate in therapy. Patient demonstrates deficits in pain, posture, RLE strength, R hip ROM, PFM spasm. Patient continues to respond well to percussive STM of paraspinals during today's session and displayed a glute dominant strategy for postural corrections in standing. Patient will benefit from continued skilled therapeutic intervention to address remaining deficits in pain, posture, RLE strength, R hip ROM, PFM spasm  in order to increase function and improve overall QOL.    Personal Factors and Comorbidities  Age;Fitness;Past/Current Experience;Behavior Pattern;Social Background;Time  since onset of injury/illness/exacerbation;Finances;Comorbidity 3+    Comorbidities  ADHD, anxiety, asthma    Examination-Activity Limitations  Transfers;Locomotion Level;Caring for Others;Sit;Sleep;Squat;Stairs;Stand;Toileting;Lift;Continence    Examination-Participation Restrictions  Cleaning;Meal Prep;Driving;Laundry;Yard Work;Shop;Community Activity;Personal Finances    Stability/Clinical Decision Making  Evolving/Moderate complexity    Rehab Potential  Fair    PT Frequency  1x / week    PT Duration  12 weeks    PT Treatment/Interventions  ADLs/Self Care Home Management;Biofeedback;Cryotherapy;Electrical Stimulation;Moist Heat;Balance training;Therapeutic exercise;Therapeutic activities;Gait training;Stair training;Functional mobility training;Neuromuscular re-education;Patient/family education;Dry needling;Passive range of motion;Scar mobilization;Manual techniques;Taping;Spinal Manipulations;Joint Manipulations    PT Next Visit Plan  External PFM exam, PCST    PT Home Exercise Plan  bowel retraining    Consulted and Agree with Plan of Care  Patient       Patient will benefit from  skilled therapeutic intervention in order to improve the following deficits and impairments:  Decreased coordination, Impaired tone, Pain, Improper body mechanics, Postural dysfunction, Decreased strength, Impaired flexibility, Increased muscle spasms, Decreased range of motion  Visit Diagnosis: Other muscle spasm  Other lack of coordination  Chronic midline low back pain without sciatica     Problem List Patient Active Problem List   Diagnosis Date Noted  . UTI (urinary tract infection) 07/19/2018  . Common bile duct stone   . Abnormal liver function tests   . Abnormal magnetic resonance imaging of liver   . Transaminitis 11/01/2017  . Third degree laceration of perineum during delivery, postpartum 10/06/2017  . Gestational hypertension 10/03/2017  . Abdominal pain affecting pregnancy 06/28/2017  . 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 Presence of subdermal contraceptive device 04/07/2014  . Sleep disturbance 11/03/2013  . Adjustment disorder with mixed anxiety and depressed mood 08/12/2013   Sheria Lang PT, DPT [phone] 09/22/2019, 12:38 PM  [place] [address] [place], [name], [address] Phone: [phone]   Fax:  [phone]  Name: Hannah Hancock MRN: [id number] Date of Birth: 01-30-1996

## 2020-01-28 ENCOUNTER — Other Ambulatory Visit
Admission: RE | Admit: 2020-01-28 | Discharge: 2020-01-28 | Disposition: A | Discharge status: Home / Self Care | Payer: BC Managed Care – PPO | Source: Ambulatory Visit | Attending: Student | Admitting: Student

## 2020-01-28 DIAGNOSIS — M79662 Pain in left lower leg: Secondary | ICD-10-CM | POA: Insufficient documentation

## 2020-01-28 DIAGNOSIS — M791 Myalgia, unspecified site: Secondary | ICD-10-CM | POA: Diagnosis present

## 2020-01-28 DIAGNOSIS — M79661 Pain in right lower leg: Secondary | ICD-10-CM | POA: Diagnosis not present

## 2020-01-28 LAB — FIBRIN DERIVATIVES D-DIMER (ARMC ONLY): Fibrin derivatives D-dimer (ARMC): 193.37 ng/mL (FEU) (ref 0.00–499.00)

## 2020-05-15 ENCOUNTER — Other Ambulatory Visit: Payer: Self-pay | Admitting: Oncology

## 2020-05-15 ENCOUNTER — Ambulatory Visit (HOSPITAL_COMMUNITY)
Admission: RE | Admit: 2020-05-15 | Discharge: 2020-05-15 | Disposition: A | Discharge status: Home / Self Care | Payer: BC Managed Care – PPO | Source: Ambulatory Visit | Attending: Pulmonary Disease | Admitting: Pulmonary Disease

## 2020-05-15 DIAGNOSIS — U071 COVID-19: Secondary | ICD-10-CM

## 2020-05-15 MED ORDER — DIPHENHYDRAMINE HCL 50 MG/ML IJ SOLN: 50.0000 mg | Freq: Once | INTRAMUSCULAR | Status: DC | PRN

## 2020-05-15 MED ORDER — FAMOTIDINE IN NACL 20-0.9 MG/50ML-% IV SOLN: 20.0000 mg | Freq: Once | INTRAVENOUS | Status: DC | PRN

## 2020-05-15 MED ORDER — METHYLPREDNISOLONE SODIUM SUCC 125 MG IJ SOLR: 125.0000 mg | Freq: Once | INTRAMUSCULAR | Status: DC | PRN

## 2020-05-15 MED ORDER — EPINEPHRINE 0.3 MG/0.3ML IJ SOAJ: 0.3000 mg | Freq: Once | INTRAMUSCULAR | Status: DC | PRN

## 2020-05-15 MED ORDER — SODIUM CHLORIDE 0.9 % IV SOLN
1200.0000 mg | Freq: Once | INTRAVENOUS | Status: AC
  Administered 2020-05-15: 1200 mg via INTRAVENOUS
  Filled 2020-05-15: qty 10

## 2020-05-15 MED ORDER — SODIUM CHLORIDE 0.9 % IV SOLN: INTRAVENOUS | Status: DC | PRN

## 2020-05-15 MED ORDER — ALBUTEROL SULFATE HFA 108 (90 BASE) MCG/ACT IN AERS: 2.0000 | INHALATION_SPRAY | Freq: Once | RESPIRATORY_TRACT | Status: DC | PRN

## 2020-05-15 NOTE — Progress Notes (Signed)
  Diagnosis: COVID-19  Physician: Dr. Wright  Procedure: Covid Infusion Clinic Med: casirivimab\imdevimab infusion - Provided patient with casirivimab\imdevimab fact sheet for patients, parents and caregivers prior to infusion.  Complications: No immediate complications noted.  Discharge: Discharged home   Hannah Hancock [name] 05/15/2020   

## 2020-05-15 NOTE — Progress Notes (Signed)
I connected by phone with Mrs. Donlan to discuss the potential use of an new treatment for mild to moderate COVID-19 viral infection in non-hospitalized patients.   This patient is a age/sex that meets the FDA criteria for Emergency Use Authorization of casirivimab\imdevimab.  Has a (+) direct SARS-CoV-2 viral test result 1. Has mild or moderate COVID-19  2. Is ? 24 years of age and weighs ? 40 kg 3. Is NOT hospitalized due to COVID-19 4. Is NOT requiring oxygen therapy or requiring an increase in baseline oxygen flow rate due to COVID-19 5. Is within 10 days of symptom onset 6. Has at least one of the high risk factor(s) for progression to severe COVID-19 and/or hospitalization as defined in EUA. Specific high risk criteria : Past Medical History:  Diagnosis Date  . Abdominal pain, recurrent   . 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 Anxiety   . Asthma    no meds, no recent attacks  . Gallstones   . Tendinitis    R hip  . Urinary tract infection    Last one 4 months ago  . Weight loss   ?  ?    Symptom onset  05/12/2020   I have spoken and communicated the following to the patient or parent/caregiver:   1. FDA has authorized the emergency use of casirivimab\imdevimab for the treatment of mild to moderate COVID-19 in adults and pediatric patients with positive results of direct SARS-CoV-2 viral testing who are 17 years of age and older weighing at least 40 kg, and who are at high risk for progressing to severe COVID-19 and/or hospitalization.   2. The significant known and potential risks and benefits of casirivimab\imdevimab, and the extent to which such potential risks and benefits are unknown.   3. Information on available alternative treatments and the risks and benefits of those alternatives, including clinical trials.   4. Patients treated with casirivimab\imdevimab should continue to self-isolate and use infection control measures (e.g., wear mask, isolate,  social distance, avoid sharing personal items, clean and disinfect "high touch" surfaces, and frequent handwashing) according to CDC guidelines.    5. The patient or parent/caregiver has the option to accept or refuse casirivimab\imdevimab .   After reviewing this information with the patient, The patient agreed to proceed with receiving casirivimab\imdevimab infusion and will be provided a copy of the Fact sheet prior to receiving the infusion.Mignon Pine, AGNP-C [phone] (Infusion Center Hotline)

## 2020-05-15 NOTE — Discharge Instructions (Signed)

## 2021-04-21 ENCOUNTER — Other Ambulatory Visit: Payer: Self-pay | Admitting: Podiatry

## 2021-04-22 ENCOUNTER — Other Ambulatory Visit: Payer: Self-pay

## 2021-04-28 ENCOUNTER — Ambulatory Visit: Payer: BC Managed Care – PPO | Admitting: Physical Therapy

## 2021-04-28 ENCOUNTER — Encounter: Payer: Self-pay | Admitting: Physical Therapy

## 2021-04-28 ENCOUNTER — Other Ambulatory Visit: Payer: Self-pay

## 2021-04-28 DIAGNOSIS — M545 Low back pain, unspecified: Secondary | ICD-10-CM | POA: Insufficient documentation

## 2021-04-28 DIAGNOSIS — M62838 Other muscle spasm: Secondary | ICD-10-CM | POA: Diagnosis not present

## 2021-04-28 DIAGNOSIS — R278 Other lack of coordination: Secondary | ICD-10-CM | POA: Diagnosis present

## 2021-04-28 NOTE — Therapy (Signed)
[facility] [address] [place], [place], [address] Phone: [phone]   Fax:  [phone]  Physical Therapy Evaluation  Patient Details  Name: Hannah MessingChasity D Meals MRN: [id number] Date of Birth: 10/29/1995 Referring Provider (PT): Margaretmary EddyAnna Mackie  Encounter Date: 04/28/2021   PT End of Session - 04/28/21 1020     Visit Number 1    Number of Visits 12    Date for PT Re-Evaluation 07/21/21    Authorization Type IE: 04/28/2021    PT Start Time 1025    PT Stop Time 1100    PT Time Calculation (min) 35 min    Activity Tolerance Patient tolerated treatment well    Behavior During Therapy [facility] for tasks assessed/performed             Past Medical History:  Diagnosis Date   Abdominal pain, recurrent    ADHD, 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   Anxiety    Asthma    no meds, no recent attacks   Gallstones    Tendinitis    R hip   Urinary tract infection    Last one 4 months ago   Weight loss     Past Surgical History:  Procedure Laterality Date   ADENOIDECTOMY     CHOLECYSTECTOMY N/A 11/03/2017   Procedure: LAPAROSCOPIC CHOLECYSTECTOMY;  Surgeon: Carolan Shiverintron-Diaz, Edgardo, MD;  Location: [facility] ORS;  Service: General;  Laterality: N/A;   ENDOSCOPIC RETROGRADE CHOLANGIOPANCREATOGRAPHY (ERCP) WITH PROPOFOL N/A 11/02/2017   Procedure: ENDOSCOPIC RETROGRADE CHOLANGIOPANCREATOGRAPHY (ERCP) WITH PROPOFOL;  Surgeon: Midge MiniumWohl, Darren, MD;  Location: [facility] ENDOSCOPY;  Service: Endoscopy;  Laterality: N/A;   ESOPHAGOGASTRODUODENOSCOPY  10/20/2011   Procedure: ESOPHAGOGASTRODUODENOSCOPY (EGD);  Surgeon: Jon GillsJoseph H. Clark, MD;  [facility];  Service: Gastroenterology;  Laterality: N/A;    There were no vitals filed for this visit.  PELVIC FLOOR PHYSICAL THERAPY EVALUATION   SCREENING Red Flags: none  Have you had any night sweats?  Unexplained weight loss? Saddle anesthesia? Unexplained changes in bowel or bladder habits?   Mental  Status Patient is oriented to person, place and time.  Recent memory is intact.  Remote memory is intact.  Attention span and concentration are intact.  Expressive speech is intact.  Patient's fund of knowledge is within normal limits for educational level.  POSTURE/OBSERVATIONS:  Grossly WNL In sitting, B adducted hips with occasional B plantarflexion (raised heels) throughout session   GAIT: Grossly WNL  SUBJECTIVE  Chief Complaint: Patient states she is leaking urine throughout the day. 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 Patient is pretty persistent at wearing the panty liners just in case she leaks. Patient has had to change underwear and pants 2-3x per day before switching to wearing the panty liners daily. Patient also notes being prone to infections, UTI's and yeast infections. Patient states not taking any medications presently. Patient struggles with constipation occasionally and notes having occasional dribbling after standing up from voiding. She feels she does not empty completely.   Pertinent History:  Falls: negative  Surgical history: Positive for see above   Recent Procedures/Tests/Findings:   Obstetrical History: G1P1 Deliveries: vaginal  Tearing/Episiotomy: grade 3 tear   Gynecological History: Pelvic Organ Prolapse: negative Heaviness/pressure: no Pain with pelvic exam: yes, 5/10, sharp sensation internally   Urinary History: Incontinence: positive Onset: 2-3 months Triggers: bending over, sitting, cleaning the salon  Amount: unsure, possibly minimal Protective  undergarments: yes  Type: panty liners  Number used/day: changes every 3 hrs/day Fluid Intake: no H20, 1 cup of coffee in morning, 2 sodas, sweet tea/hot tea throughout day  Nocturia: 0x/night Frequency of urination: 3-4x/day Toileting posture: heels raised  Pain with urination: negative   Difficulty initiating urination: negative  Intermittent stream: negative  "Just in case": no  Gastrointestinal History: [place] Stool Chart: Type 3 Frequency of BMs: every other day or every 2 days  Toileting posture: heels raised  Straining: positive for occasional when constipated   Sexual activity/pain: Pain with intercourse: positive Leakage during intercourse: positive for occasional, unsure of amount  Initial penetration: yes   Deep thrusting: yes   Able to achieve orgasm: no   Location of pain: vaginally during penetrative sex  Max pain:  6/10 Least pain:  0/10 Pain quality: sharp  Radiating pain: no  Location of pain: low back pain  Current pain:  4/10  Max pain:  6/10 Least pain:  4/10 Pain quality: achy, tight "can't move around"  Radiating pain: no   Patient assessment of present state: Patient finds incontinence and pain with penetrative sex annoying  Current activities:  Works as a Interior and spatial designer, currently has bunions and scheduled for surgery (Sept 14th), playing videogames, taking care of 25 y.o. toddler  Patient Goals:  Trying to get the leakage under control and help ease pain in the lower back   Patient perception of overall health: Health "probably could be better and I need to drink more water"  TREATMENT  Pre-treatment assessment: Deferred for next session  RANGE OF MOTION:    LEFT RIGHT  Lumbar forward flexion (65):      Lumbar extension (30):     Lumbar lateral flexion (25):     Thoracic and Lumbar rotation (30 degrees):       Hip Flexion (0-125):      Hip IR (0-45):     Hip ER (0-45):     Hip Abduction (0-40):     Hip extension (0-15):       SENSATION: Deferred for next session  Grossly intact to light touch bilateral LEs as determined by testing dermatomes L2-S2   STRENGTH: MMT Deferred for next session   RLE LLE  Hip Flexion    Hip Extension    Hip Abduction     Hip Adduction     Hip ER     Hip IR     Knee Extension     Knee Flexion    Dorsiflexion     Plantarflexion (seated)     ABDOMINAL: Deferred for next session  Diastasis: Scar mobility:  Rib flare:   SPECIAL TESTS: Deferred for next session  Slump (SN 83, -LR 0.32): R: negative positive L: negative positive  SLR (SN 92, -LR 0.29): R: negative positive L: negative positive  FABER (SN 81): R: negative positive L: negative positive FADIR (SN 94): R: negative positive L: negative positive  Distraction (VO35): R: negative positive L: negative positive  Compression (SN/SP 69): R: negative positive L: negative positive  Stork/March (SP 93): R: negative positive L: negative positive  EXTERNAL PELVIC EXAM: Patient educated on the purpose of the pelvic exam and articulated understanding; patient consented to the exam verbally. Deferred for next session  Palpation: Breath coordination: present/absent/inconsistent Voluntary Contraction: present/absent Relaxation: full/delayed/non-relaxing Perineal movement with sustained IAP increase ("bear down"): descent/no change/elevation/excessive descent Perineal movement with rapid IAP increase ("cough"): elevation/no change/descent  INTERNAL VAGINAL EXAM: Deferred for next session  Introitus Appears:  Skin integrity:  Scar mobility: Strength (PERF):  Symmetry: Palpation: Prolapse:  ASSESSMENT Patient is a 25 year old presenting to clinic with chief complaints of pelvic pain and chronic LBP. Today's evaluation suggest deficits in PFM extensibility, PFM coordination, PFM strength, posture and pain as evidenced by urinary leakage with bending, cleaning, and sitting, increased protective undergarment use due to leakage, constant LBP 6/10, increased pain 6/10 with penetrative sex, and while toileting sits with heels raised (increase PFM tension). Patient's responses on FOTO-PFDI Pain (17) and FOTO-PFDI Urinary outcome measures (49) indicate increase in functional limitations/disability/distress. Patient's  progress may be limited due to chronic LBP and time since onset. Patient was able to achieve basic understanding of PFM in bowel/bladder, posture, and pain during today's evaluation. Patient will benefit from continued skilled therapeutic intervention to address deficits in PFM extensibility, PFM coordination, PFM strength, posture, and pain in order to increase prior level of function, and improve overall QOL.  EDUCATION Patient educated on what to expect during course of physical therapy, POC, and provided with HEP including: bladder irritants handout, proper toileting positions handout, and a bladder diary. Patient verbalized understanding and returned demonstration. Patient will benefit from further education in order to maximize compliance and understanding to achieve established long-term goals.    Objective measurements completed on examination: See above findings.       PT Long Term Goals - 04/28/21 1453       PT LONG TERM GOAL #1   Title Patient will demonstrate independent and coordinated diaphragmatic breathing in supine with a 1:2 breathing pattern for improved down-regulation of the nervous system and improved management of intra-abdominal pressures in order to increase function at home and in the community.    Baseline IE: not assessed    Time 12    Period Weeks    Status New    Target Date 07/21/21      PT LONG TERM GOAL #2   Title Patient will decrease worst pain as reported on NPRS by at least 2 points to demonstrate clinically significant reduction in pain in order to restore/improve function and overall QOL.    Baseline IE: low back-6/10, penetrative sex 6/10    Time 12    Period Weeks    Status New    Target Date 07/21/21      PT LONG TERM GOAL #3   Title Patient will report decreased reliance on protective undergarments as indicated by < 1/24 hour period to demonstrate improved bladder control and allow for increased participation in activities outside of the home.     Baseline IE: ~4-5x/day (changes every 3 hours)    Time 12    Period Weeks    Status New    Target Date 07/21/21      PT LONG TERM GOAL #4   Title Patient will report less than 4 incidents of stress urinary incontinence over the course of 4 weeks while bending/lifting/sitting/cleaning in order to demonstrate improved PFM coordination, strength, and function for improved overall QOL.    Baseline IE: unable to state how many times/day    Time 12    Period Weeks    Status New    Target Date 07/21/21      PT LONG TERM GOAL #5   Title Patient will be able to maintain sexual activities with her partner with a little bit of difficulty or moderate difficulty as determined by the FOTO-PFDI Pain outcome measure.    Baseline IE: quite a bit of difficulty  Time 12    Period Weeks    Status New    Target Date 07/21/21                    Plan - 04/28/21 1020     Clinical Impression Statement Patient is a 25 year old presenting to clinic with chief complaints of pelvic pain and chronic LBP. Today's evaluation suggest deficits in PFM extensibility, PFM coordination, PFM strength, posture and pain as evidenced by urinary leakage with bending, cleaning, and sitting, increased protective undergarment use due to leakage, constant LBP 6/10, increased pain 6/10 with penetrative sex, and while toileting sits with heels raised (increase PFM tension). Patient's responses on FOTO-PFDI Pain (17) and FOTO-PFDI Urinary outcome measures (49) indicate increase in functional limitations/disability/distress. Patient's progress may be limited due to chronic LBP and time since onset. Patient was able to achieve basic understanding of PFM in bowel/bladder, posture, and pain during today's evaluation. Patient will benefit from continued skilled therapeutic intervention to address deficits in PFM extensibility, PFM coordination, PFM strength, posture, and pain in order to increase prior level of function, and  improve overall QOL.    Personal Factors and Comorbidities Comorbidity 3+;Fitness;Time since onset of injury/illness/exacerbation;Past/Current Experience;Behavior Pattern    Comorbidities ADHD, depression, anxiety    Examination-Activity Limitations Transfers;Sit;Bend;Lift;Squat;Caring for Others;Continence    Examination-Participation Restrictions Interpersonal Relationship;Cleaning;Community Activity    Stability/Clinical Decision Making Evolving/Moderate complexity    Clinical Decision Making Moderate    Rehab Potential Fair    PT Frequency 1x / week    PT Duration 12 weeks    PT Treatment/Interventions ADLs/Self Care Home Management;Cryotherapy;Electrical Stimulation;Moist Heat;Functional mobility training;Therapeutic activities;Gait training;Therapeutic exercise;Neuromuscular re-education;Cognitive remediation;Patient/family education;Orthotic Fit/Training;Manual techniques;Taping;Spinal Manipulations;Joint Manipulations    PT Next Visit Plan physical assessment    PT Home Exercise Plan bladder irritants handout, posture while toileting handout, bladder diary    Consulted and Agree with Plan of Care Patient             Patient will benefit from skilled therapeutic intervention in order to improve the following deficits and impairments:  Decreased endurance, Decreased mobility, Hypomobility, Increased muscle spasms, Decreased activity tolerance, Decreased coordination, Decreased strength, Pain, Difficulty walking, Decreased range of motion, Improper body mechanics, Postural dysfunction  Visit Diagnosis: Other muscle spasm  Other lack of coordination  Midline low back pain without sciatica, unspecified chronicity     Problem List Patient Active Problem List   Diagnosis Date Noted   UTI (urinary tract infection) 07/19/2018   Common bile duct stone    Abnormal liver function tests    Abnormal magnetic resonance imaging of liver    Transaminitis 11/01/2017   Third degree  laceration of perineum during delivery, postpartum 10/06/2017   Gestational hypertension 10/03/2017   Abdominal pain affecting pregnancy 06/28/2017   ADHD, 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 I have personally read, edited and approve of the note as written. Sheria Lang PT, DPT [phone]  04/28/2021, 5:24 PM  [facility] [address] [place], [name], [address] Phone: [phone]   Fax:  [phone]  Name: TROY HARTZOG MRN: [id number] Date of Birth: 1996/02/09

## 2021-05-01 NOTE — Anesthesia Preprocedure Evaluation (Addendum)
Anesthesia Evaluation  Patient identified by MRN, date of birth, ID band Patient awake    Reviewed: NPO status   Airway Mallampati: II  TM Distance: >3 FB Neck ROM: full    Dental no notable dental hx.    Pulmonary neg pulmonary ROS, Current Smoker (e cig) and Patient abstained from smoking.,    Pulmonary exam normal        Cardiovascular Exercise Tolerance: Good negative cardio ROS Normal cardiovascular exam     Neuro/Psych PSYCHIATRIC DISORDERS (ADHD) Anxiety Depression negative neurological ROS     GI/Hepatic negative GI ROS, Neg liver ROS,   Endo/Other  Morbid obesity (bmi 33)  Renal/GU negative Renal ROS  negative genitourinary   Musculoskeletal   Abdominal   Peds  Hematology negative hematology ROS (+)   Anesthesia Other Findings Covid + : 3 weeks ago.  Reproductive/Obstetrics negative OB ROS                            Anesthesia Physical Anesthesia Plan  ASA: 2  Anesthesia Plan: General   Post-op Pain Management:    Induction: Intravenous  PONV Risk Score and Plan: 2 and 3 and Ondansetron, Midazolam and Scopolamine patch - Pre-op  Airway Management Planned: LMA  Additional Equipment:   Intra-op Plan:   Post-operative Plan: Extubation in OR  Informed Consent: I have reviewed the patients History and Physical, chart, labs and discussed the procedure including the risks, benefits and alternatives for the proposed anesthesia with the patient or authorized representative who has indicated his/her understanding and acceptance.       Plan Discussed with: CRNA  Anesthesia Plan Comments:        Anesthesia Quick Evaluation

## 2021-05-03 NOTE — Discharge Instructions (Signed)
[facility]  POST OPERATIVE INSTRUCTIONS FOR DR. FOWLER AND [facility] P[facility]   Take your medication as prescribed.  Pain medication should be taken only as needed.  Keep the dressing clean, dry and intact.  Keep your foot elevated above the heart level for the first 48 hours.  Walking to the bathroom and brief periods of walking are acceptable, unless we have instructed you to be non-weight bearing.  Always wear your post-op shoe when walking.  Always use your crutches if you are to be non-weight bearing.  Do not take a shower. Baths are permissible as long as the foot is kept out of the water.   Every hour you are awake:  Bend your knee 15 times. Flex foot 15 times Massage calf 15 times  Call [facility] ([phone]) if any of the following problems occur: You develop a temperature or fever. The bandage becomes saturated with blood. Medication does not stop your pain. Injury of the foot occurs. Any symptoms of infection including redness, odor, or red streaks running from wound. 

## 2021-05-17 ENCOUNTER — Encounter: Payer: Self-pay | Admitting: Podiatry

## 2021-05-25 ENCOUNTER — Ambulatory Visit
Admission: RE | Disposition: A | Discharge status: Home / Self Care | Payer: Self-pay | Source: Home / Self Care | Attending: Podiatry

## 2021-05-25 ENCOUNTER — Ambulatory Visit: Payer: BC Managed Care – PPO | Admitting: Anesthesiology

## 2021-05-25 ENCOUNTER — Ambulatory Visit
Admission: RE | Admit: 2021-05-25 | Discharge: 2021-05-25 | Disposition: A | Discharge status: Home / Self Care | Payer: BC Managed Care – PPO | Attending: Podiatry | Admitting: Podiatry

## 2021-05-25 ENCOUNTER — Other Ambulatory Visit: Payer: Self-pay

## 2021-05-25 DIAGNOSIS — Z79899 Other long term (current) drug therapy: Secondary | ICD-10-CM | POA: Insufficient documentation

## 2021-05-25 DIAGNOSIS — Z87891 Personal history of nicotine dependence: Secondary | ICD-10-CM | POA: Diagnosis not present

## 2021-05-25 DIAGNOSIS — M2012 Hallux valgus (acquired), left foot: Secondary | ICD-10-CM | POA: Insufficient documentation

## 2021-05-25 DIAGNOSIS — Z6833 Body mass index (BMI) 33.0-33.9, adult: Secondary | ICD-10-CM | POA: Diagnosis not present

## 2021-05-25 HISTORY — PX: BUNIONECTOMY: SHX129

## 2021-05-25 SURGERY — BUNIONECTOMY
Anesthesia: General | Site: Foot | Laterality: Left

## 2021-05-25 MED ORDER — PROPOFOL 10 MG/ML IV BOLUS
INTRAVENOUS | Status: DC | PRN
  Administered 2021-05-25: 150 mg via INTRAVENOUS

## 2021-05-25 MED ORDER — BUPIVACAINE HCL (PF) 0.25 % IJ SOLN
INTRAMUSCULAR | Status: DC | PRN
  Administered 2021-05-25: 5 mL

## 2021-05-25 MED ORDER — MIDAZOLAM HCL 5 MG/5ML IJ SOLN
INTRAMUSCULAR | Status: DC | PRN
  Administered 2021-05-25: 2 mg via INTRAVENOUS

## 2021-05-25 MED ORDER — ONDANSETRON HCL 4 MG/2ML IJ SOLN
4.0000 mg | Freq: Four times a day (QID) | INTRAMUSCULAR | Status: DC | PRN
  Administered 2021-05-25: 4 mg via INTRAVENOUS

## 2021-05-25 MED ORDER — BUPIVACAINE LIPOSOME 1.3 % IJ SUSP
INTRAMUSCULAR | Status: DC | PRN
  Administered 2021-05-25: 10 mL
  Administered 2021-05-25: 5 mL

## 2021-05-25 MED ORDER — CEFAZOLIN SODIUM-DEXTROSE 2-4 GM/100ML-% IV SOLN
2.0000 g | INTRAVENOUS | Status: AC
  Administered 2021-05-25: 2 g via INTRAVENOUS

## 2021-05-25 MED ORDER — DEXAMETHASONE SODIUM PHOSPHATE 4 MG/ML IJ SOLN
INTRAMUSCULAR | Status: DC | PRN
  Administered 2021-05-25: 4 mg via INTRAVENOUS

## 2021-05-25 MED ORDER — SODIUM CHLORIDE 0.9 % IR SOLN
Status: DC | PRN
  Administered 2021-05-25: 1000 mL

## 2021-05-25 MED ORDER — LIDOCAINE HCL (CARDIAC) PF 100 MG/5ML IV SOSY
PREFILLED_SYRINGE | INTRAVENOUS | Status: DC | PRN
  Administered 2021-05-25: 50 mg via INTRATRACHEAL

## 2021-05-25 MED ORDER — SCOPOLAMINE 1 MG/3DAYS TD PT72
1.0000 | MEDICATED_PATCH | TRANSDERMAL | Status: DC
  Administered 2021-05-25: 1.5 mg via TRANSDERMAL

## 2021-05-25 MED ORDER — LACTATED RINGERS IV SOLN: INTRAVENOUS | Status: DC

## 2021-05-25 MED ORDER — OXYCODONE HCL 5 MG/5ML PO SOLN
5.0000 mg | Freq: Once | ORAL | Status: DC | PRN
Start: 2021-05-25 — End: 2021-05-25

## 2021-05-25 MED ORDER — OXYCODONE HCL 5 MG PO TABS: 5.0000 mg | ORAL_TABLET | Freq: Once | ORAL | Status: DC | PRN

## 2021-05-25 MED ORDER — ONDANSETRON HCL 4 MG PO TABS: 4.0000 mg | ORAL_TABLET | Freq: Four times a day (QID) | ORAL | Status: DC | PRN

## 2021-05-25 MED ORDER — GLYCOPYRROLATE 0.2 MG/ML IJ SOLN
INTRAMUSCULAR | Status: DC | PRN
  Administered 2021-05-25: .1 mg via INTRAVENOUS

## 2021-05-25 MED ORDER — METOCLOPRAMIDE HCL 5 MG PO TABS: 5.0000 mg | ORAL_TABLET | Freq: Three times a day (TID) | ORAL | Status: DC | PRN

## 2021-05-25 MED ORDER — METOCLOPRAMIDE HCL 5 MG/ML IJ SOLN: 5.0000 mg | Freq: Three times a day (TID) | INTRAMUSCULAR | Status: DC | PRN

## 2021-05-25 MED ORDER — POVIDONE-IODINE 7.5 % EX SOLN: Freq: Once | CUTANEOUS | Status: AC

## 2021-05-25 MED ORDER — OXYCODONE-ACETAMINOPHEN 5-325 MG PO TABS: 1.0000 | ORAL_TABLET | Freq: Four times a day (QID) | ORAL | 0 refills | Status: DC | PRN

## 2021-05-25 MED ORDER — FENTANYL CITRATE (PF) 100 MCG/2ML IJ SOLN
INTRAMUSCULAR | Status: DC | PRN
  Administered 2021-05-25 (×2): 50 ug via INTRAVENOUS

## 2021-05-25 MED ORDER — DEXMEDETOMIDINE (PRECEDEX) IN NS 20 MCG/5ML (4 MCG/ML) IV SYRINGE
PREFILLED_SYRINGE | INTRAVENOUS | Status: DC | PRN
  Administered 2021-05-25: 10 ug via INTRAVENOUS
  Administered 2021-05-25: 5 ug via INTRAVENOUS

## 2021-05-25 SURGICAL SUPPLY — 45 items
APL SKNCLS STERI-STRIP NONHPOA (GAUZE/BANDAGES/DRESSINGS) ×1
BENZOIN TINCTURE PRP APPL 2/3 (GAUZE/BANDAGES/DRESSINGS) ×2 IMPLANT
BLADE OSC/SAGITTAL MD 9X18.5 (BLADE) ×2 IMPLANT
BLADE SAW LAPIPLASTY 40X11 (BLADE) ×1 IMPLANT
BLADE SURG 15 STRL LF DISP TIS (BLADE) IMPLANT
BLADE SURG 15 STRL SS (BLADE) ×4
BNDG CMPR 75X41 PLY HI ABS (GAUZE/BANDAGES/DRESSINGS) ×1
BNDG ELASTIC 4X5.8 VLCR STR LF (GAUZE/BANDAGES/DRESSINGS) ×2 IMPLANT
BNDG ESMARK 4X12 TAN STRL LF (GAUZE/BANDAGES/DRESSINGS) ×2 IMPLANT
BNDG STRETCH 4X75 STRL LF (GAUZE/BANDAGES/DRESSINGS) ×2 IMPLANT
BOOT STEPPER DURA MED (SOFTGOODS) ×1 IMPLANT
CANISTER SUCT 1200ML W/VALVE (MISCELLANEOUS) ×2 IMPLANT
COVER LIGHT HANDLE UNIVERSAL (MISCELLANEOUS) ×4 IMPLANT
CUFF TOURN SGL QUICK 18X4 (TOURNIQUET CUFF) IMPLANT
DRAPE FLUOR MINI C-ARM 54X84 (DRAPES) ×2 IMPLANT
DURAPREP 26ML APPLICATOR (WOUND CARE) ×2 IMPLANT
ELECT REM PT RETURN 9FT ADLT (ELECTROSURGICAL) ×2
ELECTRODE REM PT RTRN 9FT ADLT (ELECTROSURGICAL) ×1 IMPLANT
GAUZE SPONGE 4X4 12PLY STRL (GAUZE/BANDAGES/DRESSINGS) ×2 IMPLANT
GAUZE XEROFORM 1X8 LF (GAUZE/BANDAGES/DRESSINGS) ×2 IMPLANT
GLOVE SRG 8 PF TXTR STRL LF DI (GLOVE) ×1 IMPLANT
GLOVE SURG ENC MOIS LTX SZ7.5 (GLOVE) ×2 IMPLANT
GLOVE SURG UNDER POLY LF SZ8 (GLOVE) ×2
GOWN STRL REUS W/ TWL LRG LVL3 (GOWN DISPOSABLE) ×2 IMPLANT
GOWN STRL REUS W/TWL LRG LVL3 (GOWN DISPOSABLE) ×4
K-WIRE DBL END TROCAR 6X.045 (WIRE) ×2
KIT TURNOVER KIT A (KITS) ×2 IMPLANT
KWIRE DBL END TROCAR 6X.045 (WIRE) IMPLANT
LAPIPLASTY SYS 4A (Orthopedic Implant) ×2 IMPLANT
NS IRRIG 500ML POUR BTL (IV SOLUTION) ×2 IMPLANT
PACK EXTREMITY ARMC (MISCELLANEOUS) ×2 IMPLANT
PENCIL SMOKE EVACUATOR (MISCELLANEOUS) ×2 IMPLANT
RASP SM TEAR CROSS CUT (RASP) ×1 IMPLANT
SCREW LONG PK LAPIPLASTY 2.7 (Screw) ×8 IMPLANT
STAPLE SPR MET 9X9X9 1.5 (Staple) ×1 IMPLANT
STOCKINETTE IMPERVIOUS LG (DRAPES) ×2 IMPLANT
STRIP CLOSURE SKIN 1/4X4 (GAUZE/BANDAGES/DRESSINGS) ×2 IMPLANT
SUT ETHILON 4 0 FS (SUTURE) ×1 IMPLANT
SUT MNCRL+ 5-0 UNDYED PC-3 (SUTURE) IMPLANT
SUT MONOCRYL 5-0 (SUTURE) ×2
SUT VIC AB 3-0 SH 27 (SUTURE) ×4
SUT VIC AB 3-0 SH 27X BRD (SUTURE) IMPLANT
SUT VIC AB 4-0 SH 27 (SUTURE) ×2
SUT VIC AB 4-0 SH 27XANBCTRL (SUTURE) IMPLANT
SYSTEM LAPIPLASTY 4A (Orthopedic Implant) IMPLANT

## 2021-05-25 NOTE — Anesthesia Procedure Notes (Signed)
Procedure Name: LMA Insertion Date/Time: 05/25/2021 12:12 PM Performed by: Jimmy Picket, CRNA Pre-anesthesia Checklist: Patient identified, Emergency Drugs available, Suction available, Timeout performed and Patient being monitored Patient Re-evaluated:Patient Re-evaluated prior to induction Oxygen Delivery Method: Circle system utilized Preoxygenation: Pre-oxygenation with 100% oxygen Induction Type: IV induction LMA: LMA inserted LMA Size: 4.0 Number of attempts: 1 Placement Confirmation: positive ETCO2 and breath sounds checked- equal and bilateral Tube secured with: Tape

## 2021-05-25 NOTE — Op Note (Signed)
Operative note   Surgeon:Kimerly Rowand Lawyer: None    Preop diagnosis: Hallux valgus deformity left foot    Postop diagnosis: Same    Procedure:1. Lapidus hallux valgus correction left foot  2.  Akin proximal phalanx osteotomy left great toe    EBL: Minimal    Anesthesia:local and general    Hemostasis: Mid calf tourniquet inflated to 200 mmHg for approximately 110 minutes    Specimen: None    Complications: None    Operative indications:Hannah Hancock is an 25 y.o. that presents today for surgical intervention.  The risks/benefits/alternatives/complications have been discussed and consent has been given.    Procedure:  Patient was brought into the OR and placed on the operating table in thesupine position. After anesthesia was obtained theleft lower extremity was prepped and draped in usual sterile fashion.  Attention was directed to the dorsal aspect of the foot where a dorsal incision was made at the first met cuneiforms joint.  Sharp and blunt dissection was carried down to the periosteum.  Subperiosteal dissection was then undertaken.  This exposed the first met cuneiform joint.  This was then freed and loosened.  A small fulcrum was placed between the base of the first metatarsal and second metatarsal.  Next the joint positioner was placed on the medial aspect of the metatarsal.  A small stab incision was made at the second metatarsal.  Compression was placed for the positioner.  Good realignment of the first intermetatarsal angle was noted at this time.  Attention was then directed to the dorsomedial first MTPJ where an incision was performed.  Sharp and blunt dissection was carried down to the capsule.  The intermetatarsal space was then entered.  The conjoined tendon of the abductor was then freed from the base of the proximal phalanx.  Attention was to redirected to the first met cuneiform joint.  At this time the osteotomy cut guide was placed into the joint region.   2 vertical cuts were then made.  The cartilage material was removed from the first met cuneiform joint and the joint was then prepped with a 2.0 mm drill bit.  The joint compressor was then placed.  Good compression and realignment was noted.  Next the medial and dorsal locking plates were then placed from the [place] set.  Realignment and stability was noted in all planes.  At this time noted residual valgus of the great toe was demonstrated.  The proximal phalanx was then dissected.  A small V osteotomy was created in the midshaft apex laterally.  This was removed and the toe was placed in a better corrected position.  A small 9 x 9 bone staple was used for stability.  Realignment was noted in all planes.  Attention was redirected to the dorsomedial first MTPJ.  A small T capsulotomy was performed.  The dorsomedial eminence was noted and transected and smoothed with a power rasp.  A small capsulorrhaphy was performed.  Closure was then performed after all areas were irrigated.  3-0 Vicryl for the capsular tissue.  4-0 Vicryl in subcutaneous tissue and a 4-0 Monocryl for the skin.   Further local anesthesia was performed at the end of the case.    Patient tolerated the procedure and anesthesia well.  Was transported from the OR to the PACU with all vital signs stable and vascular status intact. To be discharged per routine protocol.  Will follow up in approximately 1 week in the outpatient clinic.

## 2021-05-25 NOTE — Anesthesia Postprocedure Evaluation (Signed)
Anesthesia Post Note  Patient: Hannah Hancock  Procedure(s) Performed: Arbutus Leas; LAPIDUS-TYPE (Left: Foot)     Patient location during evaluation: PACU Anesthesia Type: General Level of consciousness: awake and alert Pain management: pain level controlled Vital Signs Assessment: post-procedure vital signs reviewed and stable Respiratory status: spontaneous breathing, nonlabored ventilation, respiratory function stable and patient connected to nasal cannula oxygen Cardiovascular status: blood pressure returned to baseline and stable Postop Assessment: no apparent nausea or vomiting Anesthetic complications: no   No notable events documented.  Orrin Brigham

## 2021-05-25 NOTE — Transfer of Care (Signed)
Immediate Anesthesia Transfer of Care Note  Patient: Hannah Hancock  Procedure(s) Performed: Arbutus Leas; LAPIDUS-TYPE (Left: Foot)  Patient Location: PACU  Anesthesia Type: General  Level of Consciousness: awake, alert  and patient cooperative  Airway and Oxygen Therapy: Patient Spontanous Breathing and Patient connected to supplemental oxygen  Post-op Assessment: Post-op Vital signs reviewed, Patient's Cardiovascular Status Stable, Respiratory Function Stable, Patent Airway and No signs of Nausea or vomiting  Post-op Vital Signs: Reviewed and stable  Complications: No notable events documented.

## 2021-05-25 NOTE — H&P (Signed)
HISTORY AND PHYSICAL INTERVAL NOTE:  05/25/2021  11:52 AM  Hannah Hancock  has presented today for surgery, with the diagnosis of M20.12- Hallux valgus, left foot.  The various methods of treatment have been discussed with the patient.  No guarantees were given.  After consideration of risks, benefits and other options for treatment, the patient has consented to surgery.  I have reviewed the patients' chart and labs.     Hancock history and physical examination was performed in my office.  The patient was reexamined.  There have been no changes to this history and physical examination.  Hannah Hancock

## 2021-05-26 ENCOUNTER — Encounter: Payer: Self-pay | Admitting: Podiatry

## 2021-09-27 ENCOUNTER — Other Ambulatory Visit: Payer: Self-pay | Admitting: Podiatry

## 2021-09-29 ENCOUNTER — Encounter: Payer: Self-pay | Admitting: Podiatry

## 2021-10-11 NOTE — Discharge Instructions (Signed)
[facility]  POST OPERATIVE INSTRUCTIONS FOR DR. FOWLER AND [facility]   Take your medication as prescribed.  Pain medication should be taken only as needed.  Keep the dressing clean, dry and intact.  Keep your foot elevated above the heart level for the first 48 hours.  Walking to the bathroom and brief periods of walking are acceptable, unless we have instructed you to be non-weight bearing.  Always wear your post-op shoe when walking.  Always use your crutches if you are to be non-weight bearing.  Do not take a shower. Baths are permissible as long as the foot is kept out of the water.   Every hour you are awake:  Bend your knee 15 times. Flex foot 15 times Massage calf 15 times  Call [facility] ([phone]) if any of the following problems occur: You develop a temperature or fever. The bandage becomes saturated with blood. Medication does not stop your pain. Injury of the foot occurs. Any symptoms of infection including redness, odor, or red streaks running from wound. 

## 2021-10-12 ENCOUNTER — Ambulatory Visit: Payer: Self-pay

## 2021-10-12 ENCOUNTER — Ambulatory Visit: Payer: BC Managed Care – PPO | Admitting: Anesthesiology

## 2021-10-12 ENCOUNTER — Other Ambulatory Visit: Payer: Self-pay

## 2021-10-12 ENCOUNTER — Encounter
Admission: RE | Disposition: A | Discharge status: Home / Self Care | Payer: Self-pay | Source: Home / Self Care | Attending: Podiatry

## 2021-10-12 ENCOUNTER — Ambulatory Visit
Admission: RE | Admit: 2021-10-12 | Discharge: 2021-10-12 | Disposition: A | Discharge status: Home / Self Care | Payer: BC Managed Care – PPO | Attending: Podiatry | Admitting: Podiatry

## 2021-10-12 ENCOUNTER — Encounter: Payer: Self-pay | Admitting: Podiatry

## 2021-10-12 DIAGNOSIS — E663 Overweight: Secondary | ICD-10-CM | POA: Diagnosis not present

## 2021-10-12 DIAGNOSIS — Z6831 Body mass index (BMI) 31.0-31.9, adult: Secondary | ICD-10-CM | POA: Insufficient documentation

## 2021-10-12 DIAGNOSIS — M2011 Hallux valgus (acquired), right foot: Secondary | ICD-10-CM | POA: Insufficient documentation

## 2021-10-12 DIAGNOSIS — F17290 Nicotine dependence, other tobacco product, uncomplicated: Secondary | ICD-10-CM | POA: Diagnosis not present

## 2021-10-12 HISTORY — PX: BUNIONECTOMY: SHX129

## 2021-10-12 HISTORY — PX: HALLUX VALGUS AKIN: SHX6622

## 2021-10-12 LAB — POCT PREGNANCY, URINE: Preg Test, Ur: NEGATIVE

## 2021-10-12 SURGERY — BUNIONECTOMY
Anesthesia: General | Site: Foot | Laterality: Right

## 2021-10-12 MED ORDER — SCOPOLAMINE 1 MG/3DAYS TD PT72
1.0000 | MEDICATED_PATCH | Freq: Once | TRANSDERMAL | Status: DC
  Administered 2021-10-12: 1.5 mg via TRANSDERMAL

## 2021-10-12 MED ORDER — CEFAZOLIN IN SODIUM CHLORIDE 3-0.9 GM/100ML-% IV SOLN
3.0000 g | INTRAVENOUS | Status: AC
  Administered 2021-10-12: 3 g via INTRAVENOUS

## 2021-10-12 MED ORDER — ACETAMINOPHEN 325 MG PO TABS: 325.0000 mg | ORAL_TABLET | ORAL | Status: DC | PRN

## 2021-10-12 MED ORDER — DEXMEDETOMIDINE (PRECEDEX) IN NS 20 MCG/5ML (4 MCG/ML) IV SYRINGE
PREFILLED_SYRINGE | INTRAVENOUS | Status: DC | PRN
Start: 2021-10-12 — End: 2021-10-12
  Administered 2021-10-12 (×2): 10 ug via INTRAVENOUS

## 2021-10-12 MED ORDER — OXYCODONE HCL 5 MG/5ML PO SOLN: 5.0000 mg | Freq: Once | ORAL | Status: DC | PRN

## 2021-10-12 MED ORDER — LIDOCAINE HCL (CARDIAC) PF 100 MG/5ML IV SOSY
PREFILLED_SYRINGE | INTRAVENOUS | Status: DC | PRN
  Administered 2021-10-12: 50 mg via INTRATRACHEAL

## 2021-10-12 MED ORDER — LACTATED RINGERS IV SOLN: INTRAVENOUS | Status: DC

## 2021-10-12 MED ORDER — OXYCODONE-ACETAMINOPHEN 5-325 MG PO TABS: 1.0000 | ORAL_TABLET | Freq: Four times a day (QID) | ORAL | 0 refills | Status: DC | PRN

## 2021-10-12 MED ORDER — AMISULPRIDE (ANTIEMETIC) 5 MG/2ML IV SOLN
10.0000 mg | Freq: Once | INTRAVENOUS | Status: AC
Start: 2021-10-12 — End: 2021-10-12
  Administered 2021-10-12: 10 mg via INTRAVENOUS

## 2021-10-12 MED ORDER — BUPIVACAINE LIPOSOME 1.3 % IJ SUSP
INTRAMUSCULAR | Status: DC | PRN
  Administered 2021-10-12 (×2): 10 mL

## 2021-10-12 MED ORDER — FENTANYL CITRATE PF 50 MCG/ML IJ SOSY: 25.0000 ug | PREFILLED_SYRINGE | INTRAMUSCULAR | Status: DC | PRN

## 2021-10-12 MED ORDER — METOCLOPRAMIDE HCL 5 MG PO TABS: 5.0000 mg | ORAL_TABLET | Freq: Three times a day (TID) | ORAL | Status: DC | PRN

## 2021-10-12 MED ORDER — FENTANYL CITRATE (PF) 100 MCG/2ML IJ SOLN
INTRAMUSCULAR | Status: DC | PRN
  Administered 2021-10-12: 50 ug via INTRAVENOUS

## 2021-10-12 MED ORDER — ONDANSETRON HCL 4 MG PO TABS: 4.0000 mg | ORAL_TABLET | Freq: Every day | ORAL | 1 refills | Status: AC | PRN

## 2021-10-12 MED ORDER — BUPIVACAINE HCL (PF) 0.25 % IJ SOLN
INTRAMUSCULAR | Status: DC | PRN
  Administered 2021-10-12: 10 mL

## 2021-10-12 MED ORDER — MIDAZOLAM HCL 5 MG/5ML IJ SOLN
INTRAMUSCULAR | Status: DC | PRN
Start: 2021-10-12 — End: 2021-10-12
  Administered 2021-10-12: 2 mg via INTRAVENOUS

## 2021-10-12 MED ORDER — GLYCOPYRROLATE 0.2 MG/ML IJ SOLN
INTRAMUSCULAR | Status: DC | PRN
Start: 2021-10-12 — End: 2021-10-12
  Administered 2021-10-12: .1 mg via INTRAVENOUS

## 2021-10-12 MED ORDER — OXYCODONE HCL 5 MG PO TABS: 5.0000 mg | ORAL_TABLET | Freq: Once | ORAL | Status: DC | PRN

## 2021-10-12 MED ORDER — DEXAMETHASONE SODIUM PHOSPHATE 4 MG/ML IJ SOLN
INTRAMUSCULAR | Status: DC | PRN
  Administered 2021-10-12: 4 mg via INTRAVENOUS

## 2021-10-12 MED ORDER — ACETAMINOPHEN 160 MG/5ML PO SOLN: 325.0000 mg | ORAL | Status: DC | PRN

## 2021-10-12 MED ORDER — METOCLOPRAMIDE HCL 5 MG/ML IJ SOLN: 5.0000 mg | Freq: Three times a day (TID) | INTRAMUSCULAR | Status: DC | PRN

## 2021-10-12 MED ORDER — ONDANSETRON HCL 4 MG PO TABS: 4.0000 mg | ORAL_TABLET | Freq: Four times a day (QID) | ORAL | Status: DC | PRN

## 2021-10-12 MED ORDER — SODIUM CHLORIDE 0.9 % IR SOLN
Status: DC | PRN
  Administered 2021-10-12: 500 mL

## 2021-10-12 MED ORDER — PROPOFOL 10 MG/ML IV BOLUS
INTRAVENOUS | Status: DC | PRN
  Administered 2021-10-12: 150 mg via INTRAVENOUS

## 2021-10-12 MED ORDER — ONDANSETRON HCL 4 MG/2ML IJ SOLN
INTRAMUSCULAR | Status: DC | PRN
  Administered 2021-10-12: 4 mg via INTRAVENOUS

## 2021-10-12 MED ORDER — ONDANSETRON HCL 4 MG/2ML IJ SOLN: 4.0000 mg | Freq: Four times a day (QID) | INTRAMUSCULAR | Status: DC | PRN

## 2021-10-12 SURGICAL SUPPLY — 41 items
APL SKNCLS STERI-STRIP NONHPOA (GAUZE/BANDAGES/DRESSINGS) ×1
BENZOIN TINCTURE PRP APPL 2/3 (GAUZE/BANDAGES/DRESSINGS) ×2 IMPLANT
BNDG CMPR 75X41 PLY HI ABS (GAUZE/BANDAGES/DRESSINGS) ×1
BNDG COHESIVE 4X5 TAN ST LF (GAUZE/BANDAGES/DRESSINGS) ×2 IMPLANT
BNDG ELASTIC 4X5.8 VLCR STR LF (GAUZE/BANDAGES/DRESSINGS) ×2 IMPLANT
BNDG ESMARK 4X12 TAN STRL LF (GAUZE/BANDAGES/DRESSINGS) ×2 IMPLANT
BNDG GAUZE ELAST 4 BULKY (GAUZE/BANDAGES/DRESSINGS) ×2 IMPLANT
BNDG STRETCH 4X75 STRL LF (GAUZE/BANDAGES/DRESSINGS) ×2 IMPLANT
BOOT STEPPER DURA SM (SOFTGOODS) ×1 IMPLANT
CANISTER SUCT 1200ML W/VALVE (MISCELLANEOUS) ×2 IMPLANT
COVER LIGHT HANDLE UNIVERSAL (MISCELLANEOUS) ×4 IMPLANT
CUFF TOURN SGL QUICK 18X4 (TOURNIQUET CUFF) ×1 IMPLANT
DRAPE FLUOR MINI C-ARM 54X84 (DRAPES) ×2 IMPLANT
DURAPREP 26ML APPLICATOR (WOUND CARE) ×2 IMPLANT
ELECT REM PT RETURN 9FT ADLT (ELECTROSURGICAL) ×2
ELECTRODE REM PT RTRN 9FT ADLT (ELECTROSURGICAL) ×1 IMPLANT
GAUZE SPONGE 4X4 12PLY STRL (GAUZE/BANDAGES/DRESSINGS) ×2 IMPLANT
GAUZE XEROFORM 1X8 LF (GAUZE/BANDAGES/DRESSINGS) ×2 IMPLANT
GLOVE SRG 8 PF TXTR STRL LF DI (GLOVE) ×2 IMPLANT
GLOVE SURG ENC MOIS LTX SZ7.5 (GLOVE) ×4 IMPLANT
GLOVE SURG UNDER POLY LF SZ8 (GLOVE) ×4
GOWN STRL REUS W/ TWL LRG LVL3 (GOWN DISPOSABLE) ×2 IMPLANT
GOWN STRL REUS W/TWL LRG LVL3 (GOWN DISPOSABLE) ×4
KIT TURNOVER KIT A (KITS) ×2 IMPLANT
LAPIPLASTY SYS 4A (Orthopedic Implant) ×2 IMPLANT
NS IRRIG 500ML POUR BTL (IV SOLUTION) ×2 IMPLANT
PACK EXTREMITY ARMC (MISCELLANEOUS) ×2 IMPLANT
SCREW 2.7 HIGH PITCH LOCKING (Screw) ×1 IMPLANT
SCREW HIGH PITCH LOCK 2.7 (Screw) ×1 IMPLANT
STAPLE SYS JAWS 8X8X8 (Staple) ×1 IMPLANT
STOCKINETTE IMPERVIOUS LG (DRAPES) ×2 IMPLANT
STRIP CLOSURE SKIN 1/4X4 (GAUZE/BANDAGES/DRESSINGS) ×2 IMPLANT
SUT ETHILON 3-0 FS-10 30 BLK (SUTURE) ×2
SUT MNCRL 4-0 (SUTURE) ×2
SUT MNCRL 4-0 27XMFL (SUTURE) ×1
SUT VIC AB 3-0 SH 27 (SUTURE) ×2
SUT VIC AB 3-0 SH 27X BRD (SUTURE) IMPLANT
SUT VIC AB 4-0 FS2 27 (SUTURE) ×1 IMPLANT
SUTURE EHLN 3-0 FS-10 30 BLK (SUTURE) IMPLANT
SUTURE MNCRL 4-0 27XMF (SUTURE) IMPLANT
SYSTEM LAPIPLASTY 4A (Orthopedic Implant) IMPLANT

## 2021-10-12 NOTE — Anesthesia Preprocedure Evaluation (Signed)
Anesthesia Evaluation  Patient identified by MRN, date of birth, ID band Patient awake    Reviewed: Allergy & Precautions, H&P , NPO status , Patient's Chart, lab work & pertinent test results  Airway Mallampati: II  TM Distance: >3 FB Neck ROM: full    Dental no notable dental hx.    Pulmonary Current SmokerPatient did not abstain from smoking.,  Vapes   Pulmonary exam normal breath sounds clear to auscultation       Cardiovascular Normal cardiovascular exam Rhythm:regular Rate:Normal     Neuro/Psych    GI/Hepatic   Endo/Other  Overweight  Renal/GU      Musculoskeletal   Abdominal   Peds  Hematology   Anesthesia Other Findings   Reproductive/Obstetrics                             Anesthesia Physical Anesthesia Plan  ASA: 2  Anesthesia Plan: General LMA   Post-op Pain Management: Minimal or no pain anticipated   Induction:   PONV Risk Score and Plan: 2 and Treatment may vary due to age or medical condition, Ondansetron, Dexamethasone and Scopolamine patch - Pre-op  Airway Management Planned:   Additional Equipment:   Intra-op Plan:   Post-operative Plan:   Informed Consent: I have reviewed the patients History and Physical, chart, labs and discussed the procedure including the risks, benefits and alternatives for the proposed anesthesia with the patient or authorized representative who has indicated his/her understanding and acceptance.     Dental Advisory Given  Plan Discussed with: CRNA  Anesthesia Plan Comments:         Anesthesia Quick Evaluation

## 2021-10-12 NOTE — Op Note (Signed)
Operative note   Surgeon:Marny Smethers Lawyer: None    Preop diagnosis: Hallux valgus deformity right foot    Postop diagnosis: Same    Procedure: 1.  Lapidus hallux valgus correction right foot 2.  Akin proximal phalanx osteotomy right foot    EBL: Minimal    Anesthesia:local and general.  10 cc of 0.25% bupivacaine and 20 cc of Exparel long-acting anesthetic were used around the surgical site    Hemostasis: Mid calf tourniquet inflated to 200 mmHg for 87 minutes    Specimen: None    Complications: None    Operative indications:Hannah Hancock is an 26 y.o. that presents today for surgical intervention.  The risks/benefits/alternatives/complications have been discussed and consent has been given.    Procedure:  Patient was brought into the OR and placed on the operating table in thesupine position. After anesthesia was obtained theright lower extremity was prepped and draped in usual sterile fashion.  Attention was directed to the dorsal aspect of the foot where a dorsal incision was made at the first met cuneiforms joint.  Sharp and blunt dissection was carried down to the periosteum.  Subperiosteal dissection was then undertaken.  This exposed the first met cuneiform joint.  This was then freed and loosened.  A small fulcrum was placed between the base of the first metatarsal and second metatarsal.  Next the joint positioner was placed on the medial aspect of the metatarsal.  A small stab incision was made at the second metatarsal.  Compression was placed for the positioner.  Good realignment of the first intermetatarsal angle was noted at this time.  Attention was then directed to the dorsomedial first MTPJ where an incision was performed.  Sharp and blunt dissection was carried down to the capsule.  Attention was to redirected to the first met cuneiform joint.  At this time the osteotomy cut guide was placed into the joint region.  2 vertical cuts were then made.  The  cartilage material was removed from the first met cuneiform joint and the joint was then prepped with a 2.0 mm drill bit.  The joint compressor was then placed.  Good compression and realignment was noted.  Next the medial and dorsal locking plates were then placed from the [place] set.  Realignment and stability was noted in all planes at the first MTPJ.  There was residual valgus deformity distal to the MTPJ and at this time we elected to undergo an Akin osteotomy.  Incision was carried out along the proximal phalanx.  At this time a V osteotomy was created with the apex lateral.  The small wedge was removed.  Compression was applied and realignment was noted.  This was then stabilized with a small Paragon compression staple.  Good clinical realignment of the great toe was then noted at this time.  Attention was redirected to the dorsomedial first MTPJ.  A small T capsulotomy was performed.  A small capsulorrhaphy was performed.  Closure was then performed after all areas were irrigated.  3-0 Vicryl for the capsular tissue.  4-0 Vicryl in subcutaneous tissue and a 4-0 Monocryl for the skin.   Further local anesthesia was performed at the end of the case.    Patient tolerated the procedure and anesthesia well.  Was transported from the OR to the PACU with all vital signs stable and vascular status intact. To be discharged per routine protocol.  Will follow up in approximately 1 week in the outpatient clinic.

## 2021-10-12 NOTE — Anesthesia Procedure Notes (Signed)
Procedure Name: LMA Insertion Date/Time: 10/12/2021 12:00 PM Performed by: Mayme Genta, CRNA Pre-anesthesia Checklist: Patient identified, Emergency Drugs available, Suction available, Timeout performed and Patient being monitored Patient Re-evaluated:Patient Re-evaluated prior to induction Oxygen Delivery Method: Circle system utilized Preoxygenation: Pre-oxygenation with 100% oxygen Induction Type: IV induction LMA: LMA inserted LMA Size: 4.0 Number of attempts: 1 Placement Confirmation: positive ETCO2 and breath sounds checked- equal and bilateral Tube secured with: Tape

## 2021-10-12 NOTE — H&P (Signed)
HISTORY AND PHYSICAL INTERVAL NOTE:  10/12/2021  11:43 AM  Hannah Hancock  has presented today for surgery, with the diagnosis of M20.11 - Hallux valgus of right foot.  The various methods of treatment have been discussed with the patient.  No guarantees were given.  After consideration of risks, benefits and other options for treatment, the patient has consented to surgery.  I have reviewed the patients chart and labs.     A history and physical examination was performed in my office.  The patient was reexamined.  There have been no changes to this history and physical examination.  [name]

## 2021-10-12 NOTE — Anesthesia Postprocedure Evaluation (Signed)
Anesthesia Post Note  Patient: Hannah Hancock  Procedure(s) Performed: BUNIONECTOMY - LAPIDUS-TYPE (Right: Foot) HALLUX VALGUS AKIN - PHALANX OSTEOTOMY (Right: Foot)     Patient location during evaluation: PACU Anesthesia Type: General Level of consciousness: awake and alert and oriented Pain management: satisfactory to patient Vital Signs Assessment: post-procedure vital signs reviewed and stable Respiratory status: spontaneous breathing, nonlabored ventilation and respiratory function stable Cardiovascular status: blood pressure returned to baseline and stable Postop Assessment: Adequate PO intake and No signs of nausea or vomiting Anesthetic complications: no   No notable events documented.  Cherly Beach

## 2021-10-12 NOTE — Transfer of Care (Signed)
Immediate Anesthesia Transfer of Care Note  Patient: Hannah Hancock  Procedure(s) Performed: BUNIONECTOMY - LAPIDUS-TYPE (Right: Foot) HALLUX VALGUS AKIN - PHALANX OSTEOTOMY (Right: Foot)  Patient Location: PACU  Anesthesia Type: General LMA  Level of Consciousness: awake, alert  and patient cooperative  Airway and Oxygen Therapy: Patient Spontanous Breathing and Patient connected to supplemental oxygen  Post-op Assessment: Post-op Vital signs reviewed, Patient's Cardiovascular Status Stable, Respiratory Function Stable, Patent Airway and No signs of Nausea or vomiting  Post-op Vital Signs: Reviewed and stable  Complications: No notable events documented.

## 2021-10-13 ENCOUNTER — Encounter: Payer: Self-pay | Admitting: Podiatry

## 2023-08-22 NOTE — L&D Delivery Note (Signed)
 Delivery Note  Date of delivery: 05/31/2024 Estimated Date of Delivery: 05/29/24 Patient's last menstrual period was 08/23/2023 (exact date). EGA: [redacted]w[redacted]d  Delivery Note At 2:05 PM a viable female was delivered via Vaginal, Spontaneous (Presentation: Left Occiput Anterior).  APGAR: 9, 10; weight pending.  Placenta status: Spontaneous, Intact.  Cord: 3 vessels with the following complications: None.  Cord pH: n/a  First Stage: Labor onset: unknown Induction: Pitocin , Cytotec , AROM Analgesia /Anesthesia intrapartum: epidural AROM at 1225  Hannah Hancock presented to L&D with GDM and oligo. She was induced with cytotec  and pitocin . Epidural placed for pain relief.   Second Stage: Complete dilation at 1330 Onset of pushing at 1340 FHR second stage Cat I Delivery at 1405 on 05/31/2024  She progressed to complete and had a spontaneous vaginal birth of a live female over an intact perineum. The fetal head was delivered in OA position with restitution to LOA. No nuchal cord. Anterior then posterior shoulders delivered spontaneously with minimal assistance. Baby placed on mom's abdomen and attended to by transition RN. Cord clamped and cut after 1+ min by FOB.   Third Stage: Placenta delivered intact with 3VC at 1410 Placenta disposition: Pathology Uterine tone  / bleeding mod  IV pitocin  and TXA given for hemorrhage prophylaxis  Anesthesia: Epidural Episiotomy: None Lacerations: 1st degree;Perineal Suture Repair: 2.0 vicryl Est. Blood Loss (mL):  100  Complications: none  Mom to postpartum.  Baby to Couplet care / Skin to Skin.  Newborn: Birth Weight: pending  Apgar Scores: 9, 10 Feeding planned: both   Hannah Hancock, CNM 05/31/2024 2:32 PM

## 2023-10-01 ENCOUNTER — Ambulatory Visit (LOCAL_COMMUNITY_HEALTH_CENTER): Payer: Self-pay

## 2023-10-01 VITALS — BP 133/72 | Ht 63.0 in | Wt 204.0 lb

## 2023-10-01 DIAGNOSIS — Z3201 Encounter for pregnancy test, result positive: Secondary | ICD-10-CM

## 2023-10-01 DIAGNOSIS — Z3009 Encounter for other general counseling and advice on contraception: Secondary | ICD-10-CM

## 2023-10-01 LAB — PREGNANCY, URINE: Preg Test, Ur: POSITIVE — AB

## 2023-10-01 MED ORDER — PRENATAL 27-0.8 MG PO TABS: 1.0000 | ORAL_TABLET | Freq: Every day | ORAL | Status: AC

## 2023-10-01 NOTE — Progress Notes (Signed)
 UPT positive. Unsure of where she would like to receive prenatal care; encouraged pt to establish care ASAP. PHQ-9 score 8; mental health resources given to patient.   The patient was dispensed prenatal vitamins #100 today. I provided counseling today regarding the medication. We discussed the medication, the side effects and when to call clinic.   Positive pregnancy packet reviewed and given to patient. Also counseled on hydration and when to seek medical attention.   Patient given the opportunity to ask questions. Questions answered.   Clare Critchley, RN

## 2023-11-12 DIAGNOSIS — O09899 Supervision of other high risk pregnancies, unspecified trimester: Secondary | ICD-10-CM | POA: Insufficient documentation

## 2023-11-12 LAB — OB RESULTS CONSOLE VARICELLA ZOSTER ANTIBODY, IGG: Varicella: IMMUNE

## 2023-11-12 LAB — OB RESULTS CONSOLE HEPATITIS B SURFACE ANTIGEN: Hepatitis B Surface Ag: NEGATIVE

## 2023-11-12 LAB — OB RESULTS CONSOLE RPR: RPR: NONREACTIVE

## 2023-11-12 LAB — HEPATITIS C ANTIBODY: HCV Ab: NEGATIVE

## 2023-11-12 LAB — OB RESULTS CONSOLE RUBELLA ANTIBODY, IGM
Rubella: IMMUNE
Rubella: UNDETERMINED

## 2023-11-12 LAB — OB RESULTS CONSOLE HIV ANTIBODY (ROUTINE TESTING): HIV: NONREACTIVE

## 2023-12-19 ENCOUNTER — Encounter: Payer: PRIVATE HEALTH INSURANCE | Attending: Obstetrics | Admitting: Dietician

## 2023-12-19 ENCOUNTER — Encounter: Payer: Self-pay | Admitting: Dietician

## 2023-12-19 DIAGNOSIS — Z713 Dietary counseling and surveillance: Secondary | ICD-10-CM | POA: Diagnosis not present

## 2023-12-19 DIAGNOSIS — O24419 Gestational diabetes mellitus in pregnancy, unspecified control: Secondary | ICD-10-CM | POA: Diagnosis present

## 2023-12-19 DIAGNOSIS — Z3A00 Weeks of gestation of pregnancy not specified: Secondary | ICD-10-CM | POA: Insufficient documentation

## 2023-12-19 NOTE — Patient Instructions (Signed)
 Look into recipes from [email or web address]

## 2023-12-19 NOTE — Progress Notes (Signed)
 Patient was seen for Gestational Diabetes on 12/19/2023  Start time 0945 and End time 1100   Estimated due date: 05/29/2024; [redacted]w[redacted]d   Clinical: Medications: Prenatal MVI Medical History: Gestational HTN, ADHD, Transaminitis Labs: OGTT fasting 84, 1 hour 150, 2 hour 161, 3 hour 160 on 11/26/2023, A1c 5.0% on 12/10/2023  Dietary and Lifestyle History: Pt reports following low-carb diet since getting GDM diagnosis, tries to eat every few hours and balance protein with carbs and have veggies multiple times per day. Pt reports desirable glucose control since starting diet. Pt reports checking PPBG right before visit, got a value of 116 mg/dL ~1 hr after eating 1 slice of toast, 2 eggs, 2 slices of bacon. Pt reports history of constipation, states it has gotten worse since pregnancy, has started taking Miralax  with some relief. Pt reports starting a pregnancy exercise regiment, follows workout plan on app.   Physical Activity: Using pregnancy workout app, 3 or 4 days a week for 30-45 minutes. Stress: Low Sleep: Difficulty getting comfortable   24 hr Recall:  First Meal: Oatmeal, Chobani 0 sugar protein drink Snack:  Babybel Cheese, Meat stick  Second meal:  Chicken salad on sourdough, salad w/ raspberry vinaigrette, walnuts, cranberries, feta, cantaloupe Snack:  none Third meal:  Chicken fajitas on low carb wraps, onions and peppers Snack:  none Beverages:  Water (<80 oz.)   NUTRITION INTERVENTION  Nutrition education (E-1) on the following topics:   Initial Follow-up  [x]  []  Definition of Gestational Diabetes [x]  []  Why dietary management is important in controlling blood glucose [x]  []  Effects each nutrient has on blood glucose levels [x]  []  Simple carbohydrates vs complex carbohydrates [x]  []  Fluid intake [x]  []  Creating a balanced meal plan [x]  []  Carbohydrate counting  [x]  []  When to check blood glucose levels [x]  []  Proper blood glucose monitoring techniques [x]  []  Effect of  stress and stress reduction techniques  [x]  []  Exercise effect on blood glucose levels, appropriate exercise during pregnancy [x]  []  Importance of limiting caffeine and abstaining from alcohol and smoking [x]  []  Medications used for blood sugar control during pregnancy [x]  []  Hypoglycemia and rule of 15 [x]  []  Postpartum self care    Patient has a meter prior to visit. Patient is testing pre breakfast and 2 hours after each meal. FBS: 88 - 100 mg/dL Postprandial: 85 - 962 mg/dL  Patient instructed to monitor glucose levels: FBS: 60 - <= 95 mg/dL; 2 hour: <= 952 mg/dL  Patient received handouts: Nutrition Diabetes and Pregnancy Carbohydrate Counting List Blood glucose log Snack ideas for diabetes during pregnancy  Patient will be seen for follow-up as needed.

## 2024-01-07 DIAGNOSIS — O24410 Gestational diabetes mellitus in pregnancy, diet controlled: Secondary | ICD-10-CM | POA: Insufficient documentation

## 2024-01-07 DIAGNOSIS — O09299 Supervision of pregnancy with other poor reproductive or obstetric history, unspecified trimester: Secondary | ICD-10-CM | POA: Insufficient documentation

## 2024-01-07 DIAGNOSIS — Z2839 Other underimmunization status: Secondary | ICD-10-CM | POA: Insufficient documentation

## 2024-01-07 DIAGNOSIS — O99210 Obesity complicating pregnancy, unspecified trimester: Secondary | ICD-10-CM | POA: Insufficient documentation

## 2024-03-03 LAB — OB RESULTS CONSOLE HIV ANTIBODY (ROUTINE TESTING): HIV: NONREACTIVE

## 2024-03-03 LAB — OB RESULTS CONSOLE RPR: RPR: NONREACTIVE

## 2024-03-04 DIAGNOSIS — O99012 Anemia complicating pregnancy, second trimester: Secondary | ICD-10-CM | POA: Insufficient documentation

## 2024-05-01 LAB — OB RESULTS CONSOLE GC/CHLAMYDIA
Chlamydia: NEGATIVE
Neisseria Gonorrhea: NEGATIVE

## 2024-05-01 LAB — OB RESULTS CONSOLE GBS: GBS: NEGATIVE

## 2024-05-12 NOTE — Progress Notes (Signed)
 Patient's last menstrual period was 08/23/2023. Estimated Date of Delivery: 05/29/24  28 y.o. G2P1001 at [redacted]w[redacted]d  The primary encounter diagnosis was Supervision of other high risk pregnancy, antepartum (HHS-HCC). Diagnoses of History of gestational hypertension, History of maternal third degree perineal laceration, currently pregnant (HHS-HCC), Diet controlled gestational diabetes mellitus (GDM) in third trimester (HHS-HCC), Anemia affecting pregnancy in second trimester (HHS-HCC), and Obesity in pregnancy, antepartum (HHS-HCC) were also pertinent to this visit.  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 Routine Prenatal Visit    Patient c/o dizzy spells and a lot of braxton hicks    Reports: Darol Irving and good fetal movement  Denies painful contractions, bleeding, or leaking fluid.   O:   See [facility] flowsheets. BP 113/79   Pulse 93   Wt 87.2 kg (192 lb 3.2 oz)   LMP 08/23/2023   BMI 34.05 kg/m  Gen: NAD  Pulm: No use of accessory muscles, normal respirations Abdomen: Gravid, nontender  Fundal height: 38cm  FHT by doppler (distinguished from mom): 130bpm  S=D Pelvic: SVE deferred  Ext : mild edema, no rashes Psych: Mood, insight, judgement intact  Blood Sugar Log Fasting: 83-85 (0/5 elevated) Breakfast: 93 (0/1 elevated) Lunch: 96 (0/1 elevated) Dinner: 82-109 (0/3 elevated)  A/P:  28 y.o. G2P1001 at [redacted]w[redacted]d   - Problem list reviewed and/or updated.  1. Supervision of other high risk pregnancy, antepartum (HHS-HCC) GBS negative. Labor plans reviewed; denies a plan, I'll just see how it goes. Kick counts reviewed with the patient in detail.  Patient instructed to assess for fetal activity daily at regular intervals.  Counts to be done if decreased activity noted.  Patient instructed to contact the office if counts do not reveal adequate movements. Labor precautions (ROM, vaginal  bleeding, s/s labor) reviewed.  Pt verbalized understanding.  2. History of gestational hypertension BP today 113/79  3. History of maternal third degree perineal laceration, currently pregnant (HHS-HCC) Planning vaginal delivery  4. Diet controlled gestational diabetes mellitus (GDM) in third trimester (HHS-HCC) Good blood sugar control Experiencing brief dizzy spells and blood sugars have been lower. Encouraged more frequent snacks.   5. Anemia affecting pregnancy in second trimester (HHS-HCC) Continue iron supplements  6. Obesity in pregnancy, antepartum (HHS-HCC) TWG: -5.319 kg (-11 lb 11.6 oz) Body mass index is 34.05 kg/m.    Call with bleeding, contractions, PROM, or decreased fetal movement.   Return in about 1 week (around 05/19/2024) for routine Prenatal.   I personally performed the service, non-incident to. [facility])   EDSEL CHARLIES BLUSH , CNM 05/12/2024 9:59 AM

## 2024-05-19 NOTE — Progress Notes (Signed)
 Patient's last menstrual period was 08/23/2023. Estimated Date of Delivery: 05/29/24  28 y.o. G2P1001 at 105w4d  The primary encounter diagnosis was Supervision of high risk pregnancy in third trimester (HHS-HCC). Diagnoses of History of gestational hypertension, History of maternal third degree perineal laceration, currently pregnant (HHS-HCC), Diet controlled gestational diabetes mellitus (GDM) in second trimester (HHS-HCC), Anemia affecting pregnancy in second trimester (HHS-HCC), Anxiety and depression, unspecified, and Obesity in pregnancy, antepartum (HHS-HCC) were also pertinent to this visit.  S:   Patient concerns today:  - cramping  Chief Complaint  Patient presents with  . Routine Prenatal Visit    Reports: cramping  Denies bleeding and loss of fluid.   O:   See [facility] flowsheets. BP 124/87   Pulse 79   Ht 160 cm (5' 3)   Wt 87.5 kg (193 lb)   LMP 08/23/2023   BMI 34.19 kg/m  Gen: NAD  Pulm: No use of accessory muscles, normal respirations Abdomen: Gravid, nontender  Fundal height: 38cm  FHT by doppler (distinguished from mom): 140bpm  S=D Pelvic: SVE deferred Ext : no edema, no rashes Psych: Mood, insight, judgement intact  SVE:    Dilation: Closed  Effacement: Long  Station:  Floating  Consistency: medium  Position: posterior  Chaperone present for pelvic exam.   Blood sugar log reviewed: Fasting BG: 80-97, 1/10 elevated 2h PP breakfast: 92-110, 0/3 elevated 2h PP lunch: 80-94, 0/4 elevated 2h PP dinner: 84-112, 0/2 elevated   A/P:  28 y.o. G2P1001 at [redacted]w[redacted]d   Early onset A1GDM Growth u/s q4 weeks starting at 28 weeks- last on 8/15 at 32 weeks. Next at 36 weeks. Surveillance: Weekly NST/ AFI starting at 32 weeks as long as diet controlled Delivery timing: 39-[redacted]w[redacted]d if remains diet/ well controlled Obesity BMI: Pregravid BMI 39 BMI today 34.05 Surveillance covered by GDM Anxiety/ Depresion/ ADHD Medications during pregnancy: Zoloft  75 mg  daily Anemia PO ferrous sulfate 325 mg MWF 05/01/2024 - hgb 9.6, declines iron transfusion, will increase iron supplements  Tobacco use History of Gestational Hypertension/ hx of preE BP today 124/87 Hx of 3rd laceration (G1)- declined elective c/s  Rubella non Immune  - Problem list reviewed and/or updated. - GBS results: Neg - Labor plans reviewed: prefers to wait for labor  - Return in about 1 week (around 05/26/2024) for Routine OB visit.    Attestation Statement:   I personally performed the service, non-incident to. [facility])   JENNIFER RICHARDSON MYRON DELON MYRON, CNM 05/19/2024 8:53 AM

## 2024-05-30 ENCOUNTER — Inpatient Hospital Stay
Admission: EM | Admit: 2024-05-30 | Discharge: 2024-06-01 | DRG: 806 | Disposition: A | Discharge status: Home / Self Care | Payer: PRIVATE HEALTH INSURANCE

## 2024-05-30 ENCOUNTER — Encounter: Payer: Self-pay | Admitting: Obstetrics and Gynecology

## 2024-05-30 ENCOUNTER — Other Ambulatory Visit: Payer: Self-pay

## 2024-05-30 DIAGNOSIS — O99214 Obesity complicating childbirth: Secondary | ICD-10-CM | POA: Diagnosis present

## 2024-05-30 DIAGNOSIS — O9902 Anemia complicating childbirth: Secondary | ICD-10-CM | POA: Diagnosis present

## 2024-05-30 DIAGNOSIS — O09299 Supervision of pregnancy with other poor reproductive or obstetric history, unspecified trimester: Secondary | ICD-10-CM

## 2024-05-30 DIAGNOSIS — O4103X0 Oligohydramnios, third trimester, not applicable or unspecified: Secondary | ICD-10-CM | POA: Diagnosis present

## 2024-05-30 DIAGNOSIS — Z7982 Long term (current) use of aspirin: Secondary | ICD-10-CM | POA: Diagnosis not present

## 2024-05-30 DIAGNOSIS — O99012 Anemia complicating pregnancy, second trimester: Secondary | ICD-10-CM | POA: Diagnosis present

## 2024-05-30 DIAGNOSIS — K219 Gastro-esophageal reflux disease without esophagitis: Secondary | ICD-10-CM | POA: Diagnosis present

## 2024-05-30 DIAGNOSIS — O24420 Gestational diabetes mellitus in childbirth, diet controlled: Principal | ICD-10-CM | POA: Diagnosis present

## 2024-05-30 DIAGNOSIS — F32A Depression, unspecified: Secondary | ICD-10-CM | POA: Diagnosis present

## 2024-05-30 DIAGNOSIS — N942 Vaginismus: Secondary | ICD-10-CM | POA: Diagnosis present

## 2024-05-30 DIAGNOSIS — Z3490 Encounter for supervision of normal pregnancy, unspecified, unspecified trimester: Secondary | ICD-10-CM

## 2024-05-30 DIAGNOSIS — O9962 Diseases of the digestive system complicating childbirth: Secondary | ICD-10-CM | POA: Diagnosis present

## 2024-05-30 DIAGNOSIS — Z8249 Family history of ischemic heart disease and other diseases of the circulatory system: Secondary | ICD-10-CM | POA: Diagnosis not present

## 2024-05-30 DIAGNOSIS — Z3A40 40 weeks gestation of pregnancy: Secondary | ICD-10-CM

## 2024-05-30 DIAGNOSIS — O99344 Other mental disorders complicating childbirth: Secondary | ICD-10-CM | POA: Diagnosis present

## 2024-05-30 DIAGNOSIS — Z8759 Personal history of other complications of pregnancy, childbirth and the puerperium: Secondary | ICD-10-CM

## 2024-05-30 DIAGNOSIS — O24410 Gestational diabetes mellitus in pregnancy, diet controlled: Secondary | ICD-10-CM | POA: Diagnosis present

## 2024-05-30 DIAGNOSIS — O99210 Obesity complicating pregnancy, unspecified trimester: Secondary | ICD-10-CM | POA: Diagnosis present

## 2024-05-30 DIAGNOSIS — O4100X0 Oligohydramnios, unspecified trimester, not applicable or unspecified: Principal | ICD-10-CM | POA: Diagnosis present

## 2024-05-30 DIAGNOSIS — Z79899 Other long term (current) drug therapy: Secondary | ICD-10-CM

## 2024-05-30 DIAGNOSIS — Z87891 Personal history of nicotine dependence: Secondary | ICD-10-CM

## 2024-05-30 DIAGNOSIS — O99013 Anemia complicating pregnancy, third trimester: Secondary | ICD-10-CM

## 2024-05-30 DIAGNOSIS — F419 Anxiety disorder, unspecified: Secondary | ICD-10-CM | POA: Diagnosis present

## 2024-05-30 DIAGNOSIS — Z2839 Other underimmunization status: Secondary | ICD-10-CM

## 2024-05-30 DIAGNOSIS — Z833 Family history of diabetes mellitus: Secondary | ICD-10-CM

## 2024-05-30 LAB — CBC
HCT: 31.8 % — ABNORMAL LOW (ref 36.0–46.0)
Hemoglobin: 10 g/dL — ABNORMAL LOW (ref 12.0–15.0)
MCH: 25.1 pg — ABNORMAL LOW (ref 26.0–34.0)
MCHC: 31.4 g/dL (ref 30.0–36.0)
MCV: 79.9 fL — ABNORMAL LOW (ref 80.0–100.0)
Platelets: 221 K/uL (ref 150–400)
RBC: 3.98 MIL/uL (ref 3.87–5.11)
RDW: 16.2 % — ABNORMAL HIGH (ref 11.5–15.5)
WBC: 11.1 K/uL — ABNORMAL HIGH (ref 4.0–10.5)
nRBC: 0 % (ref 0.0–0.2)

## 2024-05-30 LAB — TYPE AND SCREEN
ABO/RH(D): B POS
Antibody Screen: NEGATIVE

## 2024-05-30 LAB — GLUCOSE, CAPILLARY: Glucose-Capillary: 105 mg/dL — ABNORMAL HIGH (ref 70–99)

## 2024-05-30 MED ORDER — TERBUTALINE SULFATE 1 MG/ML IJ SOLN: 0.2500 mg | Freq: Once | INTRAMUSCULAR | Status: DC | PRN

## 2024-05-30 MED ORDER — SERTRALINE HCL 50 MG PO TABS
75.0000 mg | ORAL_TABLET | Freq: Every day | ORAL | Status: DC
  Administered 2024-05-30: 75 mg via ORAL
  Filled 2024-05-30: qty 1

## 2024-05-30 MED ORDER — LIDOCAINE HCL (PF) 1 % IJ SOLN: 30.0000 mL | INTRAMUSCULAR | Status: DC | PRN

## 2024-05-30 MED ORDER — FENTANYL CITRATE (PF) 100 MCG/2ML IJ SOLN
50.0000 ug | INTRAMUSCULAR | Status: DC | PRN
  Administered 2024-05-31 (×2): 100 ug via INTRAVENOUS
  Filled 2024-05-30 (×2): qty 2

## 2024-05-30 MED ORDER — AMMONIA AROMATIC IN INHA
RESPIRATORY_TRACT | Status: AC
  Filled 2024-05-30: qty 10

## 2024-05-30 MED ORDER — SODIUM CHLORIDE 0.9% FLUSH: 3.0000 mL | INTRAVENOUS | Status: DC | PRN

## 2024-05-30 MED ORDER — OXYTOCIN-SODIUM CHLORIDE 30-0.9 UT/500ML-% IV SOLN
2.5000 [IU]/h | INTRAVENOUS | Status: DC
  Filled 2024-05-30: qty 500

## 2024-05-30 MED ORDER — MISOPROSTOL 50MCG HALF TABLET
50.0000 ug | ORAL_TABLET | ORAL | Status: DC | PRN
  Administered 2024-05-30 – 2024-05-31 (×2): 50 ug via ORAL
  Filled 2024-05-30 (×2): qty 1

## 2024-05-30 MED ORDER — SODIUM CHLORIDE 0.9% FLUSH: 3.0000 mL | Freq: Two times a day (BID) | INTRAVENOUS | Status: DC

## 2024-05-30 MED ORDER — LIDOCAINE HCL (PF) 1 % IJ SOLN
INTRAMUSCULAR | Status: AC
  Filled 2024-05-30: qty 30

## 2024-05-30 MED ORDER — ACETAMINOPHEN 500 MG PO TABS: 1000.0000 mg | ORAL_TABLET | Freq: Four times a day (QID) | ORAL | Status: DC | PRN

## 2024-05-30 MED ORDER — SOD CITRATE-CITRIC ACID 500-334 MG/5ML PO SOLN: 30.0000 mL | ORAL | Status: DC | PRN

## 2024-05-30 MED ORDER — LACTATED RINGERS IV SOLN: INTRAVENOUS | Status: DC

## 2024-05-30 MED ORDER — OXYTOCIN 10 UNIT/ML IJ SOLN
INTRAMUSCULAR | Status: AC
  Filled 2024-05-30: qty 2

## 2024-05-30 MED ORDER — OXYTOCIN-SODIUM CHLORIDE 30-0.9 UT/500ML-% IV SOLN
1.0000 m[IU]/min | INTRAVENOUS | Status: DC
  Filled 2024-05-30: qty 500

## 2024-05-30 MED ORDER — OXYTOCIN BOLUS FROM INFUSION
333.0000 mL | Freq: Once | INTRAVENOUS | Status: DC
  Administered 2024-05-31: 333 mL via INTRAVENOUS

## 2024-05-30 MED ORDER — FAMOTIDINE 20 MG PO TABS: 40.0000 mg | ORAL_TABLET | Freq: Every day | ORAL | Status: DC

## 2024-05-30 MED ORDER — LACTATED RINGERS IV SOLN: 500.0000 mL | INTRAVENOUS | Status: DC | PRN

## 2024-05-30 MED ORDER — ONDANSETRON HCL 4 MG/2ML IJ SOLN
4.0000 mg | Freq: Four times a day (QID) | INTRAMUSCULAR | Status: DC | PRN
  Administered 2024-05-31: 4 mg via INTRAVENOUS
  Filled 2024-05-30: qty 2

## 2024-05-30 MED ORDER — SODIUM CHLORIDE 0.9 % IV SOLN: 250.0000 mL | INTRAVENOUS | Status: DC | PRN

## 2024-05-30 MED ORDER — MISOPROSTOL 200 MCG PO TABS
ORAL_TABLET | ORAL | Status: AC
  Filled 2024-05-30: qty 4

## 2024-05-30 NOTE — H&P (Signed)
 OB History & Physical   History of Present Illness:   Chief Complaint: induction of labor   HPI:  Hannah Hancock is a 28 y.o. G2P1001 female at [redacted]w[redacted]d, Patient's last menstrual period was 08/23/2023 (exact date)., consistent with US  at [redacted]w[redacted]d, with Estimated Date of Delivery: 05/29/24.  She presents to L&D for induction of labor d/t oligohydramnios. She was seen for a routine prenatal appointment and growth US  today. AFI was 4.1 cm.  Her pregnancy is complicated by new dx of oligohydramnios, A1GDM, anxiety and depression, anemia, history of preeclampsia, history of 3rd degree perineal laceration, and rubella non-immune status.  She denies Loss of fluid or Vaginal bleeding. Endorses fetal movement as active.   Reports active fetal movement  Contractions: Irregular mild contractions  LOF/SROM: denies  Vaginal bleeding: denies   Factors complicating pregnancy:  Principal Problem:   Oligohydramnios Active Problems:   Anxiety and depression   Anemia affecting pregnancy in second trimester   Diet controlled gestational diabetes mellitus (GDM) in second trimester   History of gestational hypertension   Hx of preeclampsia, prior pregnancy, currently pregnant   History of maternal third degree perineal laceration, currently pregnant   Obesity in pregnancy, antepartum   Rubella non-immune status, antepartum   Vaginismus    Prenatal care site:  [facility]  Patient Active Problem List   Diagnosis Date Noted   Oligohydramnios 05/30/2024   Vaginismus 05/30/2024   Anemia affecting pregnancy in second trimester 03/04/2024   Diet controlled gestational diabetes mellitus (GDM) in second trimester 01/07/2024   Hx of preeclampsia, prior pregnancy, currently pregnant 01/07/2024   Obesity in pregnancy, antepartum 01/07/2024   Rubella non-immune status, antepartum 01/07/2024   Supervision of other high risk pregnancy, antepartum 11/12/2023   Common bile duct stone    Abnormal liver  function tests    Abnormal magnetic resonance imaging of liver    History of maternal third degree perineal laceration, currently pregnant 10/06/2017   History of gestational hypertension 10/03/2017   Vitamin D deficiency 03/09/2016   Anxiety and depression 05/03/2015   ADHD, 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   Adjustment disorder with mixed anxiety and depressed mood 08/12/2013      Maternal Diabetes: Yes:  Diabetes Type:  Diet controlled Genetic Screening: Normal Maternal Ultrasounds/Referrals: Normal Fetal Ultrasounds or other Referrals:  None Maternal Substance Abuse:  No Significant Maternal Medications:  Meds include: Zoloft  Significant Maternal Lab Results:  Group B Strep negative Number of Prenatal Visits:greater than 3 verified prenatal visits Maternal Vaccinations:RSV: Given during pregnancy >/=14 days ago and TDap Other Comments:  None   Maternal Medical History:   Past Medical History:  Diagnosis Date   Abdominal pain, recurrent    ADHD, 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   Anxiety    Asthma    no meds, no recent attacks   Gallstones    Gestational hypertension 10/03/2017   Obesity in pregnancy, antepartum 01/07/2024   Tendinitis    R hip   Third degree laceration of perineum during delivery, postpartum 10/06/2017   Urinary tract infection    Last one 4 months ago   Weight loss     Past Surgical History:  Procedure Laterality Date   ADENOIDECTOMY     BUNIONECTOMY Left 05/25/2021   Procedure: ZELL HAI;  Surgeon: Ashley Soulier, DPM;  Location: [facility];  Service: Podiatry;  Laterality: Left;  General with local   BUNIONECTOMY Right 10/12/2021   Procedure: BUNIONECTOMY - LAPIDUS-TYPE;  Surgeon: Ashley Soulier, DPM;  Location:  [facility];  Service: Podiatry;  Laterality: Right;   CHOLECYSTECTOMY N/A 11/03/2017   Procedure: LAPAROSCOPIC CHOLECYSTECTOMY;  Surgeon: Rodolph Romano, MD;  Location: [facility]  ORS;  Service: General;  Laterality: N/A;   ENDOSCOPIC RETROGRADE CHOLANGIOPANCREATOGRAPHY (ERCP) WITH PROPOFOL  N/A 11/02/2017   Procedure: ENDOSCOPIC RETROGRADE CHOLANGIOPANCREATOGRAPHY (ERCP) WITH PROPOFOL ;  Surgeon: Jinny Carmine, MD;  Location: [facility];  Service: Endoscopy;  Laterality: N/A;   ESOPHAGOGASTRODUODENOSCOPY  10/20/2011   Procedure: ESOPHAGOGASTRODUODENOSCOPY (EGD);  Surgeon: Fairy HILARIO Gaskins, MD;  Location: [facility] OR;  Service: Gastroenterology;  Laterality: N/A;   HALLUX VALGUS AKIN Right 10/12/2021   Procedure: HALLUX VALGUS AKIN - PHALANX OSTEOTOMY;  Surgeon: Ashley Soulier, DPM;  Location: [facility];  Service: Podiatry;  Laterality: Right;    No Known Allergies  Prior to Admission medications   Medication Sig Start Date End Date Taking? Authorizing Provider  aspirin 81 MG chewable tablet Chew 81 mg by mouth daily.   Yes [provider]  Cholecalciferol 125 MCG (5000 UT) TABS Take 5,000 Units by mouth daily.   Yes [provider]  famotidine  (PEPCID ) 40 MG tablet Take 40 mg by mouth daily. 04/22/24  Yes [provider]  ferrous sulfate 325 (65 FE) MG tablet Take 325 mg by mouth daily with breakfast. 03/05/24 06/03/24 Yes [provider]  Prenat w/o A-FE-Methfol-FA-DHA (PNV-DHA) 27-0.6-0.4-300 MG CAPS Take 1 capsule by mouth daily.   Yes [provider]  sertraline  (ZOLOFT ) 50 MG tablet Take 75 mg by mouth at bedtime. 04/04/24 04/04/25 Yes [provider]  ACCU-CHEK GUIDE TEST test strip 1 each 4 (four) times daily.    [provider]  blood glucose meter kit and supplies See admin instructions. 12/03/23   [provider]  Blood Glucose Monitoring Suppl (ACCU-CHEK GUIDE) w/Device KIT as directed. 12/03/23   [provider]  Fingerstix Lancets MISC 1 each by Other route. 12/13/23   [provider]    OB History  Gravida Para Term Preterm AB Living  2 1 1  0 0 1  SAB IAB Ectopic  Multiple Live Births  0 0 0 0 1    # Outcome Date GA Lbr Len/2nd Weight Sex Type Anes PTL Lv  2 Current           1 Term 10/04/17 [redacted]w[redacted]d / 02:14 3720 g F Vag-Spont EPI  LIV     Name: Lindahl,GIRL Miana     Apgar1: 8  Apgar5: 9     Social History: She  reports that she quit smoking about 7 years ago. Her smoking use included e-cigarettes. She has never used smokeless tobacco. She reports that she does not drink alcohol and does not use drugs.  Family History: family history includes Cholelithiasis in her mother; Diabetes in her paternal grandfather; Hypertension in her maternal grandmother and paternal grandfather; Kidney disease in her maternal grandmother; Miscarriages / [place] in her mother; Stroke in her maternal grandmother.   Review of Systems: A full review of systems was performed and negative except as noted in the HPI.     Physical Exam:  Vital Signs: BP (!) 130/96 (BP Location: Right Arm)   Pulse 78   Temp 97.8 F (36.6 C) (Oral)   Resp 18   Ht 5' 3 (1.6 m)   Wt 86.2 kg   LMP 08/23/2023 (Exact Date)   BMI 33.66 kg/m   General: no acute distress.  HEENT: normocephalic, atraumatic Heart: regular rate & rhythm Lungs: normal respiratory effort Abdomen: soft, gravid, non-tender;  EFW: 7 1/2 lbs  Pelvic:   External: Normal external female genitalia  Cervix: Dilation: Fingertip / Effacement (%): 50 / Station: -2    Extremities: non-tender, symmetric, mild, dependent edema bilaterally.  DTRs: 2+/2+  Neurologic: Alert & oriented x 3.    Results for orders placed or performed during the hospital encounter of 05/30/24 (from the past 24 hours)  CBC     Status: Abnormal   Collection Time: 05/30/24  9:27 PM  Result Value Ref Range   WBC 11.1 (H) 4.0 - 10.5 K/uL   RBC 3.98 3.87 - 5.11 MIL/uL   Hemoglobin 10.0 (L) 12.0 - 15.0 g/dL   HCT 68.1 (L) 63.9 - 53.9 %   MCV 79.9 (L) 80.0 - 100.0 fL   MCH 25.1 (L) 26.0 - 34.0 pg   MCHC 31.4 30.0 - 36.0 g/dL   RDW 83.7 (H)  88.4 - 15.5 %   Platelets 221 150 - 400 K/uL   nRBC 0.0 0.0 - 0.2 %  Type and screen     Status: None (Preliminary result)   Collection Time: 05/30/24  9:27 PM  Result Value Ref Range   ABO/RH(D) PENDING    Antibody Screen PENDING    Sample Expiration      06/02/2024,2359 Performed at [facility], [address] [place], [name] [address]     Pertinent Results:  Prenatal Labs: Blood type/Rh B POS  Antibody screen neg  Rubella Immune (03/24 0000)   Varicella Immune  RPR NR  HBsAg Negative (03/24 0000)   Hep C NR  HIV Non-reactive (07/14 0000)   GC neg  Chlamydia neg  Genetic screening cfDNA negative  1 hour GTT 191  3 hour GTT 84 150 161 160   GBS Negative/-- (09/11 0000)    FHT:  FHR: 135 bpm, variability: moderate,  accelerations:  Present,  decelerations:  Absent Category/reactivity:  Category I UC:   Irregular, mild contractions    Cephalic by [place] and US  done 05/30/2024   No results found.  Assessment:  ELIZA GREEN is a 28 y.o. G78P1001 female at [redacted]w[redacted]d with Oligohydramnios.   Plan:  1. Admit to Labor & Delivery - Admission status: Inpatient - Dr WENDI Penton MD notified of admission and plan of care  - Reason for admission: induction of labor - consents reviewed and obtained  2. Fetal Well being  - Fetal Tracing: Cat 1 - Group B Streptococcus ppx indicated: GBS negative - Presentation: cephalic confirmed by US  done 05/30/2024   3. Routine OB: - Prenatal labs reviewed, as above - Rh positive - CBC, T&S, RPR on admit - Gestational carb modified diet , saline lock  4. Induction of labor  - Contractions monitored with external toco - Pelvis proven to 3720 grams, adequate for trial of labor  - Plan for induction with misoprostol   - Induction with oxytocin  and AROM as appropriate  - Plan for  continuous fetal monitoring - Maternal pain control as desired; planning regional anesthesia - Anticipate vaginal delivery  5. A1GDM  - Diet  controlled  - Gestational carb modified diet ordered  - Plan for fasting CBG and 2 hour PP during cervical ripening   6. Post Partum Planning: - Infant feeding: breast feeding - Contraception: natural family planning (NFP) - Flu vaccine: due postpartum  - Tdap vaccine: Given prenatally - RSV vaccine: Given during pregnancy >/=14 days ago  Therisa CHRISTELLA Pillow, [place] 05/30/24 10:30 PM  Therisa Pillow, CNM Certified Nurse Midwife [facility]

## 2024-05-30 NOTE — Progress Notes (Signed)
 G2P1001 at [redacted]w[redacted]d, Patient's last menstrual period was 08/23/2023 (exact date)., c/w early US  at [redacted]w[redacted]d.  Scheduled for induction of labor for oligohydramnios on 05/30/2024.   Prenatal provider: [facility] Pregnancy complicated by: 1. Encounter for induction of labor   2. Oligohydramnios in third trimester, single or unspecified fetus   3. Diet controlled gestational diabetes mellitus (GDM) in second trimester   4. Rubella non-immune status, antepartum   5. Obesity in pregnancy, antepartum   6. History of maternal third degree perineal laceration, currently pregnant   7. Hx of preeclampsia, prior pregnancy, currently pregnant   8. Anemia affecting pregnancy in third trimester   9. Anxiety and depression      Prenatal Labs: Blood type/Rh B POS  Antibody screen neg  Rubella Immune (03/24 0000)   Varicella Immune  RPR NR  HBsAg Negative (03/24 0000)   Hep C NR  HIV Non-reactive (07/14 0000)   GC neg  Chlamydia neg  Genetic screening cfDNA negative  1 hour GTT 191  3 hour GTT 84 150 161 160   GBS Negative/-- (09/11 0000)    Flu: Due postpartum  Tdap: Given prenatally RSV: Given prenatally Contraception: Natural Family Planning Feeding preference: breast feeding Peds: IFC   ____ Therisa Pillow, CNM Certified Nurse Midwife [place]  [facility]

## 2024-05-30 NOTE — Progress Notes (Signed)
 Obstetrics & Gynecology Office Visit  Subjective  Hannah Hancock is a 28 y.o. G2P1001 at [redacted]w[redacted]d being seen today for ongoing prenatal care.  She is currently monitored for tthis high-risk pregnancy.  Patient's last menstrual period was 08/23/2023. Estimated Date of Delivery: 05/29/24  History of Present Illness A 28 year old female who presents for routine prenatal appointment and BPP.  She is currently at 40 weeks of gestation with amniotic fluid levels measuring at 4 centimeters. She experiences contractions that are inconsistent in frequency and intensity, sometimes resembling menstrual cramps and at other times more intense, making it difficult to talk through them. These contractions occur multiple times an hour but are not regular.  She denies symptoms of leakage of fluid.     Pt denies vaginal bleeding, leaking fluid. Endorses good fetal movement Pt denies HA, VD or RUQ pain.   Objective  BP 126/83   Pulse 83   Ht 160 cm (5' 3)   Wt 90.3 kg (199 lb)   LMP 08/23/2023   BMI 35.25 kg/m    Pre-pregnant weight: 92.5 kg (203 lb 14.8 oz)  TWG: -2.234 kg (-4 lb 14.8 oz)  Gen: NAD  Pulm: No use of accessory muscles, normal respirations Abdomen: Gravid, nontender Ext : No edema, no rashes.   Psych: Mood, insight, judgement intact SVE: deferred  Fundal height: S=D  US : 05/30/2024 EFW: 3375 grams, 7 lbs7 oz AFI: 4.1 cm FHT: 139 bpm Presentation: cephalic Placenta: posterior BPP 6/8 - 0 points for AFI    Assessment   28 y.o. G2P1001 at [redacted]w[redacted]d by  05/29/2024, by Last Menstrual Period presenting for routine prenatal visit  The primary encounter diagnosis was Supervision of other high risk pregnancy, antepartum (HHS-HCC). Diagnoses of History of gestational hypertension, History of maternal third degree perineal laceration, currently pregnant (HHS-HCC), Diet controlled gestational diabetes mellitus (GDM) in second trimester (HHS-HCC), Anemia affecting pregnancy in second  trimester (HHS-HCC), and Oligohydramnios in third trimester, single or unspecified fetus (HHS-HCC) were also pertinent to this visit.   Plan   Problem list reviewed and/or updated   Assessment & Plan High-risk pregnancy with gestational diabetes mellitus, anemia, and oligohydramnios High-risk pregnancy due to gestational diabetes, anemia, and oligohydramnios. AFI decreased today at 4 cm. Recommend proceeding with induction of labor. Normal blood pressure despite past gestational hypertension. - Induce labor due to oligohydramnios. - Plan cervical check on admission to determine induction method - Discussed epidural option, advised not to delay if chosen due to potential rapid labor. - L&D notified of admission     Return in about 2 weeks (around 06/13/2024) for postpartum mood check .   Attestation Statement:   I personally performed the service, non-incident to. (WP)   ANNA MICHELLE MACKIE, CNM   This note has been created using automated tools and reviewed for accuracy by [facility].

## 2024-05-31 ENCOUNTER — Inpatient Hospital Stay: Admitting: Anesthesiology

## 2024-05-31 ENCOUNTER — Encounter: Payer: Self-pay | Admitting: Obstetrics and Gynecology

## 2024-05-31 LAB — RPR: RPR Ser Ql: NONREACTIVE

## 2024-05-31 LAB — GLUCOSE, CAPILLARY: Glucose-Capillary: 94 mg/dL (ref 70–99)

## 2024-05-31 MED ORDER — PRENATAL MULTIVITAMIN CH
1.0000 | ORAL_TABLET | Freq: Every day | ORAL | Status: DC
  Administered 2024-06-01: 1 via ORAL
  Filled 2024-05-31 (×2): qty 1

## 2024-05-31 MED ORDER — DIBUCAINE (PERIANAL) 1 % EX OINT: 1.0000 | TOPICAL_OINTMENT | CUTANEOUS | Status: DC | PRN

## 2024-05-31 MED ORDER — SODIUM CHLORIDE 0.9 % IV SOLN
INTRAVENOUS | Status: DC | PRN
  Administered 2024-05-31 (×2): 5 mL via EPIDURAL

## 2024-05-31 MED ORDER — FENTANYL-BUPIVACAINE-NACL 0.5-0.125-0.9 MG/250ML-% EP SOLN
12.0000 mL/h | EPIDURAL | Status: DC | PRN
  Administered 2024-05-31: 12 mL/h via EPIDURAL
  Filled 2024-05-31: qty 250

## 2024-05-31 MED ORDER — SERTRALINE HCL 25 MG PO TABS
75.0000 mg | ORAL_TABLET | Freq: Every day | ORAL | Status: DC
  Administered 2024-06-01: 75 mg via ORAL
  Filled 2024-05-31: qty 3

## 2024-05-31 MED ORDER — ONDANSETRON HCL 4 MG/2ML IJ SOLN: 4.0000 mg | INTRAMUSCULAR | Status: DC | PRN

## 2024-05-31 MED ORDER — EPHEDRINE 5 MG/ML INJ: 10.0000 mg | INTRAVENOUS | Status: DC | PRN

## 2024-05-31 MED ORDER — BENZOCAINE-MENTHOL 20-0.5 % EX AERO: 1.0000 | INHALATION_SPRAY | CUTANEOUS | Status: DC | PRN

## 2024-05-31 MED ORDER — OXYCODONE HCL 5 MG PO TABS: 10.0000 mg | ORAL_TABLET | ORAL | Status: DC | PRN

## 2024-05-31 MED ORDER — ACETAMINOPHEN 325 MG PO TABS: 650.0000 mg | ORAL_TABLET | ORAL | Status: DC | PRN

## 2024-05-31 MED ORDER — IBUPROFEN 600 MG PO TABS
600.0000 mg | ORAL_TABLET | Freq: Four times a day (QID) | ORAL | Status: DC
  Administered 2024-06-01 (×3): 600 mg via ORAL
  Filled 2024-05-31 (×5): qty 1

## 2024-05-31 MED ORDER — LACTATED RINGERS IV SOLN: 500.0000 mL | Freq: Once | INTRAVENOUS | Status: DC

## 2024-05-31 MED ORDER — OXYCODONE HCL 5 MG PO TABS: 5.0000 mg | ORAL_TABLET | ORAL | Status: DC | PRN

## 2024-05-31 MED ORDER — LIDOCAINE HCL (PF) 1 % IJ SOLN
INTRAMUSCULAR | Status: DC | PRN
  Administered 2024-05-31: 3 mL via SUBCUTANEOUS

## 2024-05-31 MED ORDER — ONDANSETRON HCL 4 MG PO TABS: 4.0000 mg | ORAL_TABLET | ORAL | Status: DC | PRN

## 2024-05-31 MED ORDER — WITCH HAZEL-GLYCERIN EX PADS: 1.0000 | MEDICATED_PAD | CUTANEOUS | Status: DC | PRN

## 2024-05-31 MED ORDER — LIDOCAINE-EPINEPHRINE (PF) 1.5 %-1:200000 IJ SOLN
INTRAMUSCULAR | Status: DC | PRN
  Administered 2024-05-31: 3 mL via EPIDURAL

## 2024-05-31 MED ORDER — EPHEDRINE 5 MG/ML INJ
10.0000 mg | INTRAVENOUS | Status: DC | PRN
Start: 2024-05-31 — End: 2024-05-31

## 2024-05-31 MED ORDER — TETANUS-DIPHTH-ACELL PERTUSSIS 5-2-15.5 LF-MCG/0.5 IM SUSP: 0.5000 mL | Freq: Once | INTRAMUSCULAR | Status: DC

## 2024-05-31 MED ORDER — METHYLERGONOVINE MALEATE 0.2 MG PO TABS: 0.2000 mg | ORAL_TABLET | ORAL | Status: DC | PRN

## 2024-05-31 MED ORDER — DIPHENHYDRAMINE HCL 50 MG/ML IJ SOLN: 12.5000 mg | INTRAMUSCULAR | Status: DC | PRN

## 2024-05-31 MED ORDER — FERROUS SULFATE 325 (65 FE) MG PO TABS
325.0000 mg | ORAL_TABLET | Freq: Two times a day (BID) | ORAL | Status: DC
  Administered 2024-06-01 (×2): 325 mg via ORAL
  Filled 2024-05-31 (×2): qty 1

## 2024-05-31 MED ORDER — METHYLERGONOVINE MALEATE 0.2 MG/ML IJ SOLN: 0.2000 mg | INTRAMUSCULAR | Status: DC | PRN

## 2024-05-31 MED ORDER — SERTRALINE HCL 25 MG PO TABS
75.0000 mg | ORAL_TABLET | Freq: Every day | ORAL | Status: DC
  Filled 2024-05-31: qty 1

## 2024-05-31 MED ORDER — SIMETHICONE 80 MG PO CHEW: 80.0000 mg | CHEWABLE_TABLET | ORAL | Status: DC | PRN

## 2024-05-31 MED ORDER — PHENYLEPHRINE 80 MCG/ML (10ML) SYRINGE FOR IV PUSH (FOR BLOOD PRESSURE SUPPORT): 80.0000 ug | PREFILLED_SYRINGE | INTRAVENOUS | Status: DC | PRN

## 2024-05-31 MED ORDER — SENNOSIDES-DOCUSATE SODIUM 8.6-50 MG PO TABS
2.0000 | ORAL_TABLET | Freq: Every day | ORAL | Status: DC
  Administered 2024-06-01: 2 via ORAL
  Filled 2024-05-31: qty 2

## 2024-05-31 MED ORDER — TRANEXAMIC ACID-NACL 1000-0.7 MG/100ML-% IV SOLN
INTRAVENOUS | Status: AC
  Administered 2024-05-31: 10 mg
  Filled 2024-05-31: qty 100

## 2024-05-31 MED ORDER — COCONUT OIL OIL: 1.0000 | TOPICAL_OIL | Status: DC | PRN

## 2024-05-31 MED ORDER — DIPHENHYDRAMINE HCL 25 MG PO CAPS: 25.0000 mg | ORAL_CAPSULE | Freq: Four times a day (QID) | ORAL | Status: DC | PRN

## 2024-05-31 NOTE — Anesthesia Procedure Notes (Signed)
 Epidural Patient location during procedure: OB Start time: 05/31/2024 8:59 AM End time: 05/31/2024 9:02 AM  Staffing Anesthesiologist: Dario Barter, MD Performed: anesthesiologist   Preanesthetic Checklist Completed: patient identified, IV checked, site marked, risks and benefits discussed, surgical consent, monitors and equipment checked, pre-op evaluation and timeout performed  Epidural Patient position: sitting Prep: ChloraPrep Patient monitoring: heart rate, continuous pulse ox and blood pressure Approach: midline Location: L3-L4 Injection technique: LOR saline  Needle:  Needle type: Tuohy  Needle gauge: 17 G Needle length: 9 cm Needle insertion depth: 5 cm Catheter type: closed end flexible Catheter size: 19 Gauge Catheter at skin depth: 10 cm Test dose: negative and 1.5% lidocaine  with Epi 1:200 K  Assessment Sensory level: T10 Events: blood not aspirated, no cerebrospinal fluid, injection not painful, no injection resistance, no paresthesia and negative IV test  Additional Notes 1st attempt Pt. Evaluated and documentation done after procedure finished. Patient identified. Risks/Benefits/Options discussed with patient including but not limited to bleeding, infection, nerve damage, paralysis, failed block, incomplete pain control, headache, blood pressure changes, nausea, vomiting, reactions to medication both or allergic, itching and postpartum back pain. Confirmed with bedside nurse the patient's most recent platelet count. Confirmed with patient that they are not currently taking any anticoagulation, have any bleeding history or any family history of bleeding disorders. Patient expressed understanding and wished to proceed. All questions were answered. Sterile technique was used throughout the entire procedure. Please see nursing notes for vital signs. Test dose was given through epidural catheter and negative prior to continuing to dose epidural or start infusion.  Warning signs of high block given to the patient including shortness of breath, tingling/numbness in hands, complete motor block, or any concerning symptoms with instructions to call for help. Patient was given instructions on fall risk and not to get out of bed. All questions and concerns addressed with instructions to call with any issues or inadequate analgesia.    Patient tolerated the insertion well without immediate complications.Reason for block:procedure for pain

## 2024-05-31 NOTE — Progress Notes (Signed)
 Labor Progress Note  Hannah Hancock is a 28 y.o. G2P1001 at [redacted]w[redacted]d by LMP admitted for induction of labor due to Low amniotic fluid..  Subjective: Pt has asked for an epidural prior to starting pitocin  in light of her vaginismus.Epidural being placed now.  Objective: BP 124/80   Pulse 63   Temp 97.9 F (36.6 C) (Oral)   Resp 16   Ht 5' 3 (1.6 m)   Wt 86.2 kg   LMP 08/23/2023 (Exact Date)   BMI 33.66 kg/m   Fetal Assessment: FHT:  FHR: 120 bpm, variability: moderate,  accelerations:  Present,  decelerations:  Absent Category/reactivity:  Category I UC:   regular, every 2-6 minutes SVE:    Dilation: 0.5cm  Effacement: 50%  Station:  -2  Consistency: ---  Position: ---  Membrane status: Intact Amniotic color: n/a  Labs: Lab Results  Component Value Date   WBC 11.1 (H) 05/30/2024   HGB 10.0 (L) 05/30/2024   HCT 31.8 (L) 05/30/2024   MCV 79.9 (L) 05/30/2024   PLT 221 05/30/2024    Assessment / Plan: Induction of labor due to oligohydramnios 2201 0.5/50/-2 2208 Cytotec  50mcg PO given 0244 Cytotec  50mcg PO given 0915 Epidural placed  Labor: Progressing normally Preeclampsia:  124/80 Fetal Wellbeing:  Category I Pain Control:  Epidural I/D:  Afebrile, GBS neg, Intact Anticipated MOD:  NSVD  Jenifer E Mckinley Olheiser, CNM 05/31/2024, 8:59 AM

## 2024-05-31 NOTE — Anesthesia Preprocedure Evaluation (Signed)
 Anesthesia Evaluation  Patient identified by MRN, date of birth, ID band Patient awake    Reviewed: Allergy & Precautions, H&P , NPO status , Patient's Chart, lab work & pertinent test results, reviewed documented beta blocker date and time   History of Anesthesia Complications Negative for: history of anesthetic complications  Airway Mallampati: II  TM Distance: >3 FB Neck ROM: full    Dental no notable dental hx.    Pulmonary neg shortness of breath, asthma , neg sleep apnea, neg COPD, neg recent URI, former smoker   Pulmonary exam normal breath sounds clear to auscultation       Cardiovascular Exercise Tolerance: Good (-) hypertensionnegative cardio ROS Normal cardiovascular exam Rhythm:regular Rate:Normal     Neuro/Psych  PSYCHIATRIC DISORDERS Anxiety Depression    negative neurological ROS     GI/Hepatic Neg liver ROS,GERD  ,,  Endo/Other  diabetes, Gestational    Renal/GU negative Renal ROS  negative genitourinary   Musculoskeletal   Abdominal   Peds  Hematology  (+) Blood dyscrasia, anemia   Anesthesia Other Findings Past Medical History: No date: Abdominal pain, recurrent 05/08/2014: ADHD, predominantly inattentive type No date: Anxiety No date: Asthma     Comment:  no meds, no recent attacks No date: Gallstones 10/03/2017: Gestational hypertension 01/07/2024: Obesity in pregnancy, antepartum No date: Tendinitis     Comment:  R hip 10/06/2017: Third degree laceration of perineum during delivery,  postpartum No date: Urinary tract infection     Comment:  Last one 4 months ago No date: Weight loss   Reproductive/Obstetrics (+) Pregnancy                              Anesthesia Physical Anesthesia Plan  ASA: 2  Anesthesia Plan: Epidural   Post-op Pain Management:    Induction:   PONV Risk Score and Plan:   Airway Management Planned:   Additional Equipment:    Intra-op Plan:   Post-operative Plan:   Informed Consent: I have reviewed the patients History and Physical, chart, labs and discussed the procedure including the risks, benefits and alternatives for the proposed anesthesia with the patient or authorized representative who has indicated his/her understanding and acceptance.     Dental Advisory Given  Plan Discussed with: Anesthesiologist, CRNA and Surgeon  Anesthesia Plan Comments:          Anesthesia Quick Evaluation

## 2024-05-31 NOTE — Progress Notes (Signed)
 Labor Progress Note  Hannah Hancock is a 28 y.o. G2P1001 at [redacted]w[redacted]d by LMP admitted for induction of labor due to Low amniotic fluid..  Subjective: Pt is very comfortable with her epidural, she has been able to nap well  Objective: BP 115/79   Pulse 66   Temp 97.9 F (36.6 C) (Oral)   Resp 16   Ht 5' 3 (1.6 m)   Wt 86.2 kg   LMP 08/23/2023 (Exact Date)   SpO2 97%   BMI 33.66 kg/m   Fetal Assessment: FHT:  FHR: 140 bpm, variability: moderate,  accelerations:  Present,  decelerations:  Absent Category/reactivity:  Category I UC:   regular, every 2 minutes SVE:    Dilation: 9.5cm  Effacement: 100%  Station:  +1  Consistency: ---  Position: ---  Membrane status: AROM at 1225 Amniotic color: no fluid present, unable to assess   Labs: Lab Results  Component Value Date   WBC 11.1 (H) 05/30/2024   HGB 10.0 (L) 05/30/2024   HCT 31.8 (L) 05/30/2024   MCV 79.9 (L) 05/30/2024   PLT 221 05/30/2024    Assessment / Plan: Induction of labor due to oligohydramnios 2201 0.5/50/-2 2208 Cytotec  50mcg PO given 0244 Cytotec  50mcg PO given 0915 Epidural placed 1035 Pitocin  started 1225 AROM'd Currently at 2mU  Labor: Progressing normally Preeclampsia:  115/79 Fetal Wellbeing:  Category I Pain Control:  Epidural I/D:  Afebrile, GBS neg, AROM Anticipated MOD:  NSVD  Margery FORBES Coe, CNM 05/31/2024, 12:27 PM

## 2024-05-31 NOTE — Discharge Summary (Signed)
 Postpartum Discharge Summary  Patient Name: Hannah Hancock DOB: 1995/11/13 MRN: [id number]  Date of admission: 05/30/2024 Delivery date:05/31/2024 Delivering provider: MYRON NEST Date of discharge: 06/01/2024  Primary OB: [facility] OFE:Ejupzwu'd last menstrual period was 08/23/2023 (exact date). EDC Estimated Date of Delivery: 05/29/24 Gestational Age at Delivery: [redacted]w[redacted]d   Admitting diagnosis: Oligohydramnios [O41.00X0] Intrauterine pregnancy: [redacted]w[redacted]d     Secondary diagnosis:   Principal Problem:   NSVD (normal spontaneous vaginal delivery) Active Problems:   Anxiety and depression   Anemia affecting pregnancy in second trimester   Diet controlled gestational diabetes mellitus (GDM) in second trimester   History of gestational hypertension   Hx of preeclampsia, prior pregnancy, currently pregnant   History of maternal third degree perineal laceration, currently pregnant   Obesity in pregnancy, antepartum   Rubella non-immune status, antepartum   Oligohydramnios   Vaginismus   Discharge Diagnosis: Term Pregnancy Delivered and GDM and Oligo      Hospital course: Induction of Labor With Vaginal Delivery   28 y.o. yo G2P2002 at [redacted]w[redacted]d was admitted to the hospital 05/30/2024 for induction of labor.  Indication for induction: GDM and oligo.  Patient had an labor course complicated by none. Membrane Rupture Time/Date: 12:25 PM,05/31/2024  Delivery Method:Vaginal, Spontaneous Operative Delivery:N/A Episiotomy: None Lacerations:  1st degree;Perineal Details of delivery can be found in separate delivery note.  Patient had a postpartum course complicated by none. Patient is discharged home 06/01/24.  Newborn Data: Birth date:05/31/2024 Birth time:2:05 PM Gender:Female Living status:Living Apgars:9 ,10  Weight:3300 g                                            Post partum procedures:IV iron  Induction:: AROM, Pitocin , and Cytotec  Complications: None Delivery Type:  spontaneous vaginal delivery Anesthesia: epidural anesthesia Placenta: spontaneous To Pathology: Yes   Prenatal Labs:  Blood type/Rh B POS  Antibody screen neg  Rubella Immune (03/24 0000)   Varicella Immune  RPR NR  HBsAg Negative (03/24 0000)   Hep C NR  HIV Non-reactive (07/14 0000)   GC neg  Chlamydia neg  Genetic screening cfDNA negative  1 hour GTT 191  3 hour GTT 84 150 161 160   GBS Negative/-- (09/11 0000)     Magnesium Sulfate received: No BMZ received: No Rhophylac:was not indicated MMR: was not indicated Varivax vaccine given: was not indicated T-DaP:Given prenatally Flu: Due PP  Transfusion:No  Physical exam  Vitals:   05/31/24 2232 06/01/24 0213 06/01/24 0537 06/01/24 0715  BP: 120/78 105/67 124/77 120/64  Pulse: 69 78 87 76  Resp: 18 16 18 18   Temp: 98.1 F (36.7 C) 98.3 F (36.8 C) 97.9 F (36.6 C) 98.2 F (36.8 C)  TempSrc: Oral Oral Oral Oral  SpO2: 93% 97% 99% 98%  Weight:      Height:       General: alert, cooperative, and no distress Lochia: appropriate Uterine Fundus: firm Perineum:minimal edema/repair well approximated Incision: n/a DVT Evaluation: No evidence of DVT seen on physical exam.  Labs: Lab Results  Component Value Date   WBC 11.1 (H) 06/01/2024   HGB 8.1 (L) 06/01/2024   HCT 26.3 (L) 06/01/2024   MCV 80.7 06/01/2024   PLT 184 06/01/2024      Latest Ref Rng & Units 04/23/2019    6:00 PM  CMP  Glucose 70 - 99 mg/dL 97  BUN 6 - 20 mg/dL 14   Creatinine 9.55 - 1.00 mg/dL 9.48   Sodium 864 - 854 mmol/L 138   Potassium 3.5 - 5.1 mmol/L 3.6   Chloride 98 - 111 mmol/L 106   CO2 22 - 32 mmol/L 22   Calcium 8.9 - 10.3 mg/dL 9.5   Total Protein 6.5 - 8.1 g/dL 7.6   Total Bilirubin 0.3 - 1.2 mg/dL 0.2   Alkaline Phos 38 - 126 U/L 80   AST 15 - 41 U/L 14   ALT 0 - 44 U/L 12    Edinburgh Score:     No data to display          Risk assessment for postpartum VTE and prophylactic treatment: Very high risk  factors: None High risk factors: None Moderate risk factors: None  Postpartum VTE prophylaxis with LMWH not indicated  After visit meds:  Allergies as of 06/01/2024   No Known Allergies      Medication List     STOP taking these medications    ferrous sulfate  325 (65 FE) MG tablet       TAKE these medications    acetaminophen  325 MG tablet Commonly known as: Tylenol  Take 2 tablets (650 mg total) by mouth every 6 (six) hours as needed (for pain scale < 4).   Cholecalciferol 125 MCG (5000 UT) Tabs Take 5,000 Units by mouth daily.   famotidine  40 MG tablet Commonly known as: PEPCID  Take 40 mg by mouth daily.   ibuprofen  600 MG tablet Commonly known as: ADVIL  Take 1 tablet (600 mg total) by mouth every 6 (six) hours as needed.   PNV-DHA 27-0.6-0.4-300 MG Caps Take 1 capsule by mouth daily.   sertraline  50 MG tablet Commonly known as: ZOLOFT  Take 75 mg by mouth at bedtime.       Discharge home in stable condition Infant Feeding: both Infant Disposition:home with mother Discharge instruction: per After Visit Summary and Postpartum booklet. Activity: Advance as tolerated. Pelvic rest for 6 weeks.  Diet: routine diet Anticipated Birth Control: NFP Postpartum Appointment:6 weeks Additional Postpartum F/U: Postpartum Depression checkup Future Appointments:No future appointments. Follow up Visit:  Follow-up Information     Myron Nest, CNM. Schedule an appointment as soon as possible for a visit in 2 week(s).   Specialty: Certified Nurse Midwife Why: for a mood check Contact information: [address] [name] [address] [phone]         Myron Nest, CNM. Schedule an appointment as soon as possible for a visit in 6 week(s).   Specialty: Certified Nurse Midwife Contact information: [address] [name] [address] [phone]                 Plan:  Hannah Hancock was discharged to home in good  condition. Follow-up appointment as directed.    Signed: Jenifer E Kewon Statler 06/01/2024 11:16 AM

## 2024-05-31 NOTE — Lactation Note (Signed)
 This note was copied from a baby's chart. Lactation Consultation Note  Patient Name: Hannah Hancock Unijb'd Date: 05/31/2024 Age:28 hours Reason for consult: L&D Initial assessment;Term   Maternal Data Has patient been taught Hand Expression?: Yes Does the patient have breastfeeding experience prior to this delivery?: Yes How long did the patient breastfeed?: 1 yr Used a nipple shield x 1 yr Feeding Mother's Current Feeding Choice: Breast Milk Mom had just breastfed baby on right breast, baby still rooting, encouraged mom to offer left breast, baby able to latch well after hand expression and  breast shaping, continues to nurse with occ swallows at 10 min.  LATCH Score Latch: Grasps breast easily, tongue down, lips flanged, rhythmical sucking.  Audible Swallowing: A few with stimulation  Type of Nipple: Everted at rest and after stimulation  Comfort (Breast/Nipple): Soft / non-tender  Hold (Positioning): Assistance needed to correctly position infant at breast and maintain latch.  LATCH Score: 8   Lactation Tools Discussed/Used    Interventions Interventions: Breast feeding basics reviewed;Assisted with latch;Skin to skin;Hand express;Support pillows;Education  Discharge Pump: Personal;Hands Free WIC Program: Yes  Consult Status Consult Status: Follow-up from L&D Date: 06/01/24 Follow-up type: In-patient    [name] 05/31/2024, 4:01 PM

## 2024-06-01 LAB — CBC
HCT: 26.3 % — ABNORMAL LOW (ref 36.0–46.0)
Hemoglobin: 8.1 g/dL — ABNORMAL LOW (ref 12.0–15.0)
MCH: 24.8 pg — ABNORMAL LOW (ref 26.0–34.0)
MCHC: 30.8 g/dL (ref 30.0–36.0)
MCV: 80.7 fL (ref 80.0–100.0)
Platelets: 184 K/uL (ref 150–400)
RBC: 3.26 MIL/uL — ABNORMAL LOW (ref 3.87–5.11)
RDW: 16.3 % — ABNORMAL HIGH (ref 11.5–15.5)
WBC: 11.1 K/uL — ABNORMAL HIGH (ref 4.0–10.5)
nRBC: 0 % (ref 0.0–0.2)

## 2024-06-01 MED ORDER — IBUPROFEN 600 MG PO TABS: 600.0000 mg | ORAL_TABLET | Freq: Four times a day (QID) | ORAL | Status: AC | PRN

## 2024-06-01 MED ORDER — IRON SUCROSE 300 MG IVPB - SIMPLE MED
300.0000 mg | Freq: Once | Status: AC
  Administered 2024-06-01: 300 mg via INTRAVENOUS
  Filled 2024-06-01: qty 300

## 2024-06-01 MED ORDER — ACETAMINOPHEN 325 MG PO TABS: 650.0000 mg | ORAL_TABLET | Freq: Four times a day (QID) | ORAL | Status: AC | PRN

## 2024-06-01 NOTE — Anesthesia Postprocedure Evaluation (Signed)
 Anesthesia Post Note  Patient: Hannah Hancock  Procedure(s) Performed: AN AD HOC LABOR EPIDURAL  Patient location during evaluation: Mother Baby Anesthesia Type: Epidural Level of consciousness: awake and alert Pain management: pain level controlled Vital Signs Assessment: post-procedure vital signs reviewed and stable Respiratory status: spontaneous breathing, nonlabored ventilation and respiratory function stable Cardiovascular status: stable Postop Assessment: no headache, no backache and epidural receding Anesthetic complications: no   No notable events documented.   Last Vitals:  Vitals:   06/01/24 0537 06/01/24 0715  BP: 124/77 120/64  Pulse: 87 76  Resp: 18 18  Temp: 36.6 C 36.8 C  SpO2: 99% 98%    Last Pain:  Vitals:   06/01/24 0830  TempSrc:   PainSc: 3                  Debby Mines

## 2024-06-01 NOTE — Discharge Instructions (Signed)

## 2024-06-01 NOTE — Progress Notes (Signed)
Patient discharged. Discharge instructions given. Patient verbalizes understanding. Transported by RN. 

## 2024-06-01 NOTE — Lactation Note (Signed)
 This note was copied from a baby's chart. Lactation Consultation Note  Patient Name: Boy Analeia Ismael Unijb'd Date: 06/01/2024 Age:28 hours Reason for consult: Follow-up assessment;Difficult latch;Breastfeeding assistance;Nipple pain/trauma;Maternal discharge LC to MOB bedside for follow-up consultation to evaluate latch/feeding at breast prior to discharge  Maternal Data  MOB is experienced with breastfeeding having fed infant's sibling > 1 year. Prior issues included difficulty in everting nipples requiring utilization of nipple shield, which MOB verbalized she has not introduced at this time. She reports experiencing pain upon initiation of infant latch with mild abrasions on nipple/areola and denies any pain associated with engorgement.   Feeding Mother's Current Feeding Choice: Breast Milk LC provided brief oral examination of infant prior to feed, noting mild labial tether on upper gum line with no indication of feeding impediment and no additional discovery of tongue/buccal restriction. Infant extends tongue past gumline and has vigorous and rhythmic suck, with tendency to curl lower lip under following attempt to latch. LC demo various methods of compressing nipple and guiding infant to open mouth as wide as possible, with additional manual adjustment to infant lips upon latching if needed. MOB verbalized a notable reduction in pain and denies additional questions or concerns about feeding plan.   Lactation Tools Discussed/Used Tools: Nipple Shields Nipple shield size: 20;24 (Issued per MOB request at discharge, not utilized for latch assistance during consultation) -Hand pump administered prior to discharge -Size 25m and 24mm shields administered per MOB request, with guidance regarding utilization given by LC -Comfort Gels x2 and education on how to use without exacerbating surface damage provided Interventions Interventions: Breast feeding basics reviewed;Assisted with  latch;Breast massage;Breast compression;Support pillows;Comfort gels;Hand Archivist;Infant Driven Feeding Algorithm education;CDC milk storage guidelines;CDC Guidelines for Breast Pump Cleaning Reviewed proper alignment--infant's head, neck, and body in a straight line facing the breast. Encouraged bringing infant to breast rather than leaning mother toward baby. Recommended placing support under maternal wrist to facilitate deeper latch and reduce strain. Discharge Discharge Education: Engorgement and breast care;Warning signs for feeding baby;Outpatient recommendation LC presented and reviewed discharge packet with information regarding outpatient clinic contact, with CDC recommendations for cleaning breast pump equipment and safe storage guidelines for expressed milk, hand expression, and warning signs for mastitis and engorgement strategies attached to patient AVS. MOB verbalized no further request for evaluation or assessment and will plan on contacting clinic if additional assistance is requested  Consult Status Consult Status: Complete Date: 06/01/24 Follow-up type: In-patient    Donald JONETTA Minerva 06/01/2024, 2:05 PM

## 2024-06-01 NOTE — Anesthesia Post-op Follow-up Note (Signed)
  Anesthesia Pain Follow-up Note  Patient: Hannah Hancock  Day #: 1  Date of Follow-up: 06/01/2024 Time: 9:31 AM  Last Vitals:  Vitals:   06/01/24 0537 06/01/24 0715  BP: 124/77 120/64  Pulse: 87 76  Resp: 18 18  Temp: 36.6 C 36.8 C  SpO2: 99% 98%    Level of Consciousness: alert  Pain: none   Side Effects:None  Catheter Site Exam:clean, dry, no drainage     Plan: D/C from anesthesia care at surgeon's request  Debby Mines

## 2024-06-03 LAB — SURGICAL PATHOLOGY
